# Patient Record
Sex: Female | Born: 1985 | Race: White | Hispanic: No | Marital: Single | Smoking: Current every day smoker
Health system: Southern US, Community
[De-identification: ages and names within clinical notes are randomized; demographics above are authoritative.]

## PROBLEM LIST (undated history)

## (undated) DIAGNOSIS — A419 Sepsis, unspecified organism: Secondary | ICD-10-CM

## (undated) DIAGNOSIS — F4310 Post-traumatic stress disorder, unspecified: Secondary | ICD-10-CM

## (undated) DIAGNOSIS — F32A Depression, unspecified: Secondary | ICD-10-CM

## (undated) DIAGNOSIS — F1410 Cocaine abuse, uncomplicated: Secondary | ICD-10-CM

## (undated) DIAGNOSIS — N390 Urinary tract infection, site not specified: Secondary | ICD-10-CM

## (undated) DIAGNOSIS — F1110 Opioid abuse, uncomplicated: Secondary | ICD-10-CM

## (undated) DIAGNOSIS — F329 Major depressive disorder, single episode, unspecified: Secondary | ICD-10-CM

## (undated) DIAGNOSIS — Z3A24 24 weeks gestation of pregnancy: Secondary | ICD-10-CM

## (undated) DIAGNOSIS — F319 Bipolar disorder, unspecified: Secondary | ICD-10-CM

## (undated) DIAGNOSIS — F1910 Other psychoactive substance abuse, uncomplicated: Secondary | ICD-10-CM

## (undated) DIAGNOSIS — F419 Anxiety disorder, unspecified: Secondary | ICD-10-CM

## (undated) DIAGNOSIS — R45851 Suicidal ideations: Secondary | ICD-10-CM

## (undated) HISTORY — PX: APPENDECTOMY: SHX54

---
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
## 2006-09-19 ENCOUNTER — Emergency Department (HOSPITAL_COMMUNITY): Admission: EM | Admit: 2006-09-19 | Discharge: 2006-09-19 | Payer: Self-pay | Admitting: Emergency Medicine

## 2006-10-02 ENCOUNTER — Emergency Department (HOSPITAL_COMMUNITY): Admission: EM | Admit: 2006-10-02 | Discharge: 2006-10-02 | Payer: Self-pay | Admitting: Emergency Medicine

## 2006-11-12 ENCOUNTER — Emergency Department (HOSPITAL_COMMUNITY): Admission: EM | Admit: 2006-11-12 | Discharge: 2006-11-12 | Payer: Self-pay | Admitting: Emergency Medicine

## 2007-05-16 ENCOUNTER — Emergency Department (HOSPITAL_COMMUNITY): Admission: EM | Admit: 2007-05-16 | Discharge: 2007-05-16 | Payer: Self-pay | Admitting: Emergency Medicine

## 2008-02-22 ENCOUNTER — Emergency Department (HOSPITAL_COMMUNITY): Admission: EM | Admit: 2008-02-22 | Discharge: 2008-02-22 | Payer: Self-pay | Admitting: Emergency Medicine

## 2008-09-18 ENCOUNTER — Emergency Department (HOSPITAL_BASED_OUTPATIENT_CLINIC_OR_DEPARTMENT_OTHER): Admission: EM | Admit: 2008-09-18 | Discharge: 2008-09-18 | Payer: Self-pay | Admitting: Emergency Medicine

## 2008-12-31 ENCOUNTER — Emergency Department (HOSPITAL_COMMUNITY): Admission: EM | Admit: 2008-12-31 | Discharge: 2008-12-31 | Payer: Self-pay | Admitting: Emergency Medicine

## 2009-04-08 ENCOUNTER — Ambulatory Visit: Payer: Self-pay | Admitting: Internal Medicine

## 2009-04-08 ENCOUNTER — Ambulatory Visit: Payer: Self-pay | Admitting: Cardiology

## 2009-04-08 ENCOUNTER — Inpatient Hospital Stay (HOSPITAL_COMMUNITY): Admission: EM | Admit: 2009-04-08 | Discharge: 2009-04-13 | Payer: Self-pay | Admitting: Emergency Medicine

## 2009-04-10 ENCOUNTER — Encounter: Payer: Self-pay | Admitting: Internal Medicine

## 2009-09-07 ENCOUNTER — Emergency Department (HOSPITAL_COMMUNITY): Admission: EM | Admit: 2009-09-07 | Discharge: 2009-09-07 | Payer: Self-pay | Admitting: Emergency Medicine

## 2009-10-09 IMAGING — CR DG CHEST 1V PORT
1 series · 1 of 1 positions shown · non-contrast
Comparison: 04/08/2009.

CLINICAL DATA: Sepsis.

PORTABLE CHEST - 1 VIEW

[view not recorded]
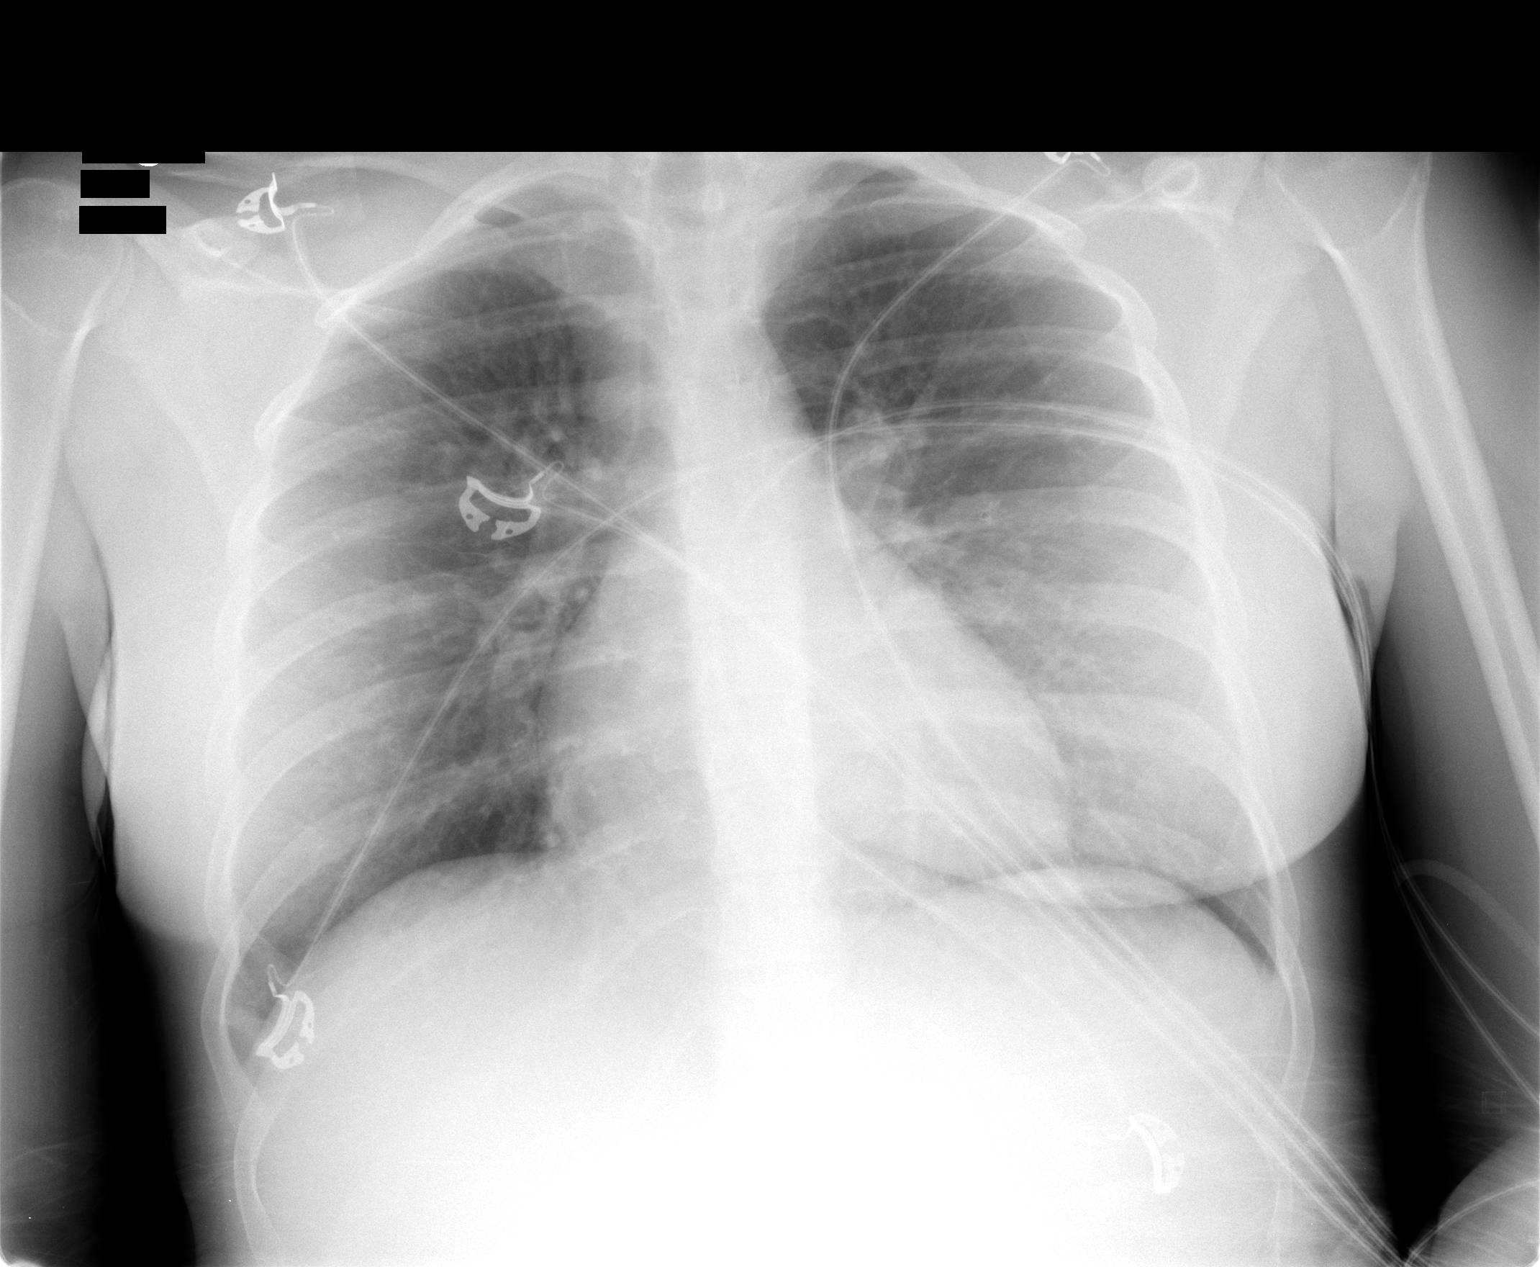

[1 of 1 positions shown; findings below may reference images not displayed]

FINDINGS: No infiltrate, gross pneumothorax or congestive heart
failure.  Heart appears more prominent than on prior exam which may
be related portable technique and poor inspiration.
IMPRESSION: No infiltrate.

## 2009-10-09 IMAGING — CR DG CHEST 1V PORT
1 series · 1 of 1 positions shown · non-contrast
Comparison: 04/09/2009

CLINICAL DATA: Sepsis/line placement

PORTABLE CHEST - 1 VIEW

[view not recorded]
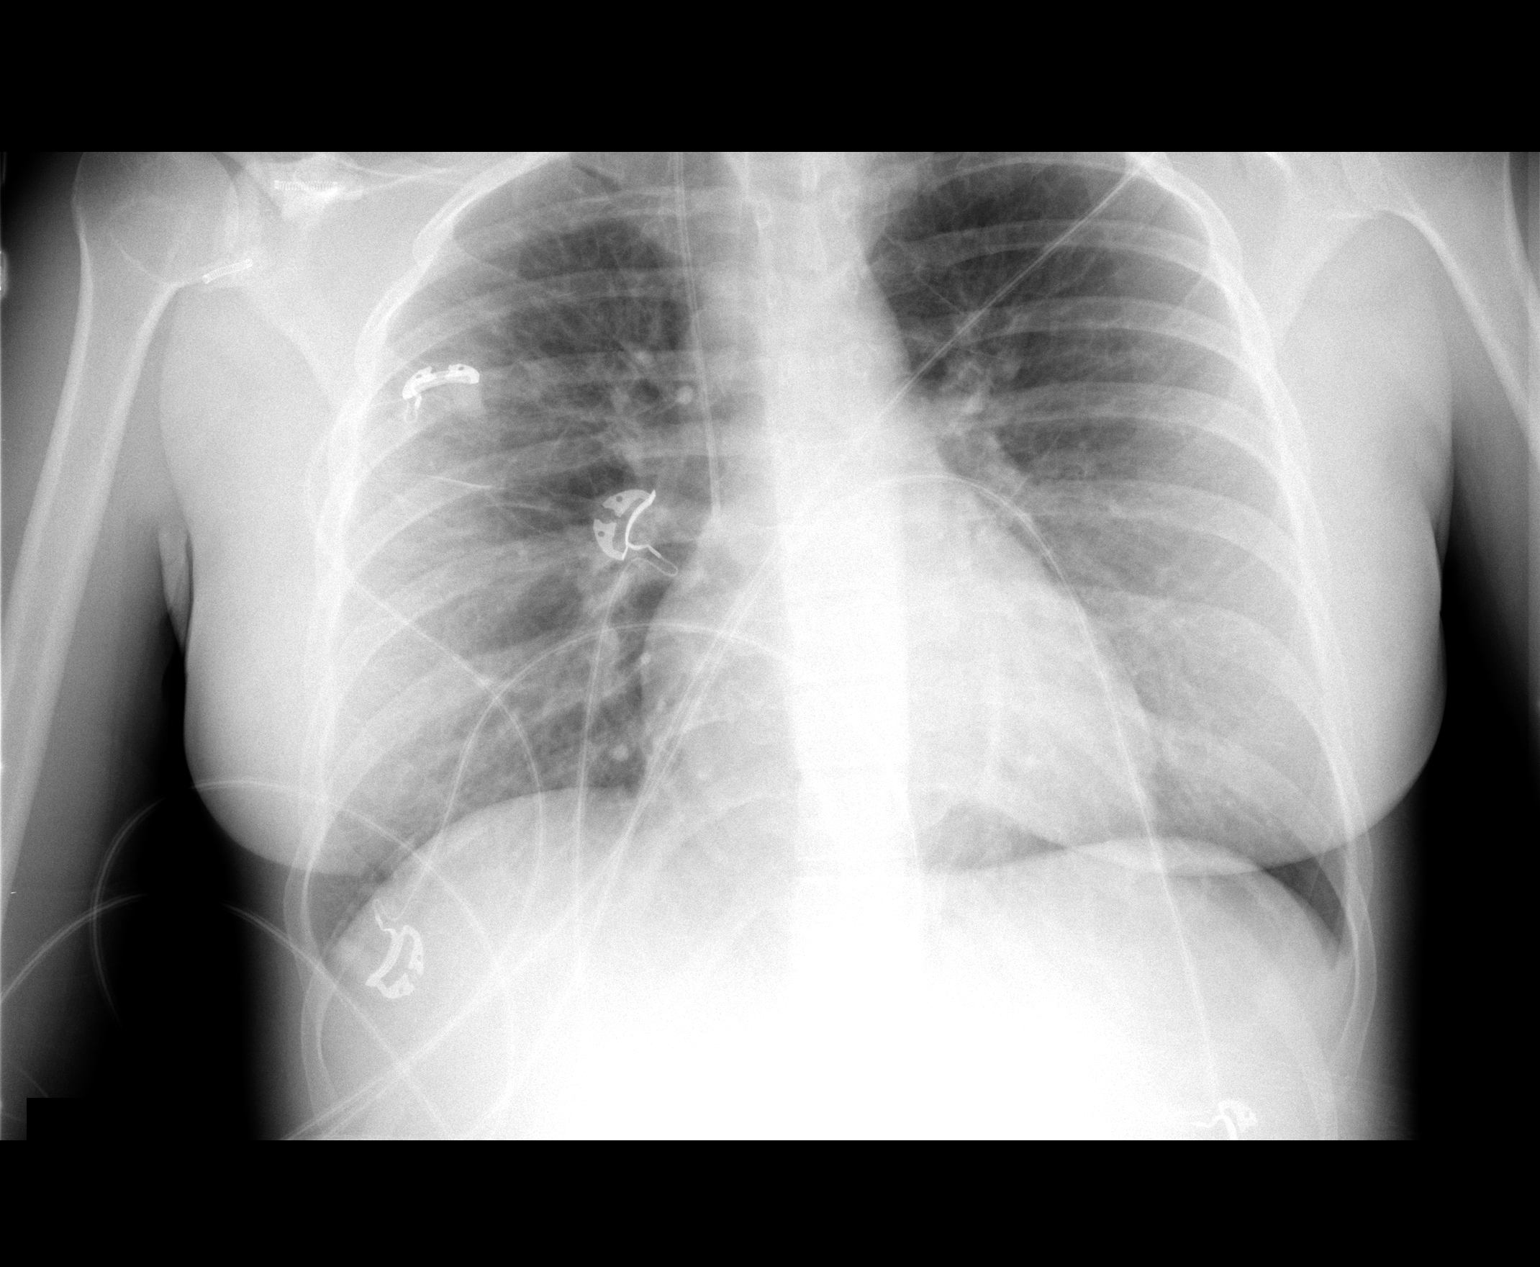

[1 of 1 positions shown; findings below may reference images not displayed]

FINDINGS: A right jugular central line is in place with its tip in
the mid SVC.  No pneumothorax.  Heart and lungs remain normal.
IMPRESSION: Right IJ central line placement to the SVC - no pneumothorax or
active disease.

## 2009-10-10 IMAGING — CR DG CHEST 1V PORT
1 series · 1 of 1 positions shown · non-contrast
Comparison: 04/09/2009.

CLINICAL DATA: Increased shortness of breath.

PORTABLE CHEST - 1 VIEW

[view not recorded]
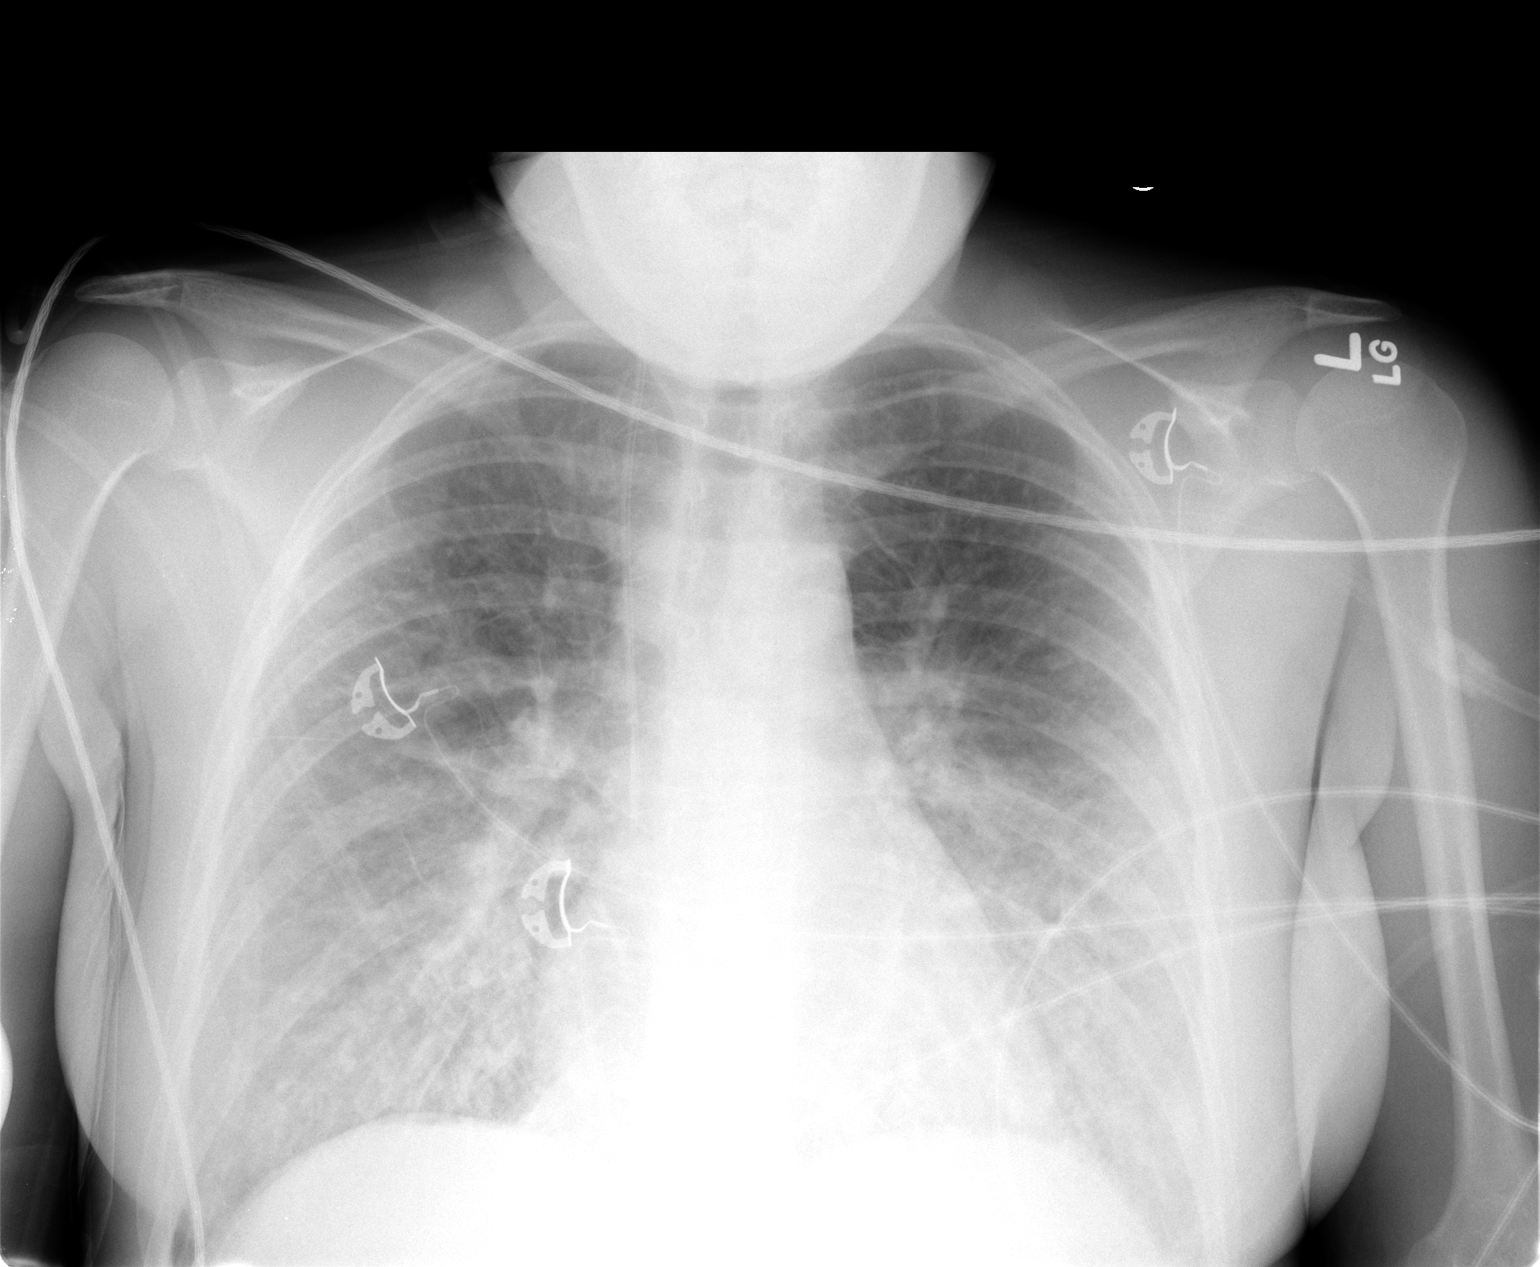

[1 of 1 positions shown; findings below may reference images not displayed]

FINDINGS: There is interval progression of a diffuse interstitial
pattern, suggesting edema.  A right IJ line is stable in position.
No focal airspace disease is present.
IMPRESSION: Interval development of moderate interstitial edema.

## 2009-10-11 ENCOUNTER — Emergency Department (HOSPITAL_COMMUNITY): Admission: EM | Admit: 2009-10-11 | Discharge: 2009-10-11 | Payer: Self-pay | Admitting: Emergency Medicine

## 2009-10-14 ENCOUNTER — Emergency Department (HOSPITAL_COMMUNITY): Admission: EM | Admit: 2009-10-14 | Discharge: 2009-10-15 | Payer: Self-pay | Admitting: Emergency Medicine

## 2009-12-10 ENCOUNTER — Emergency Department (HOSPITAL_COMMUNITY): Admission: EM | Admit: 2009-12-10 | Discharge: 2009-12-11 | Payer: Self-pay | Admitting: Emergency Medicine

## 2010-07-23 ENCOUNTER — Emergency Department (HOSPITAL_COMMUNITY): Admission: EM | Admit: 2010-07-23 | Discharge: 2010-07-23 | Payer: Self-pay | Admitting: Emergency Medicine

## 2011-01-04 ENCOUNTER — Emergency Department (HOSPITAL_COMMUNITY): Admission: EM | Admit: 2011-01-04 | Discharge: 2011-01-04 | Source: Home / Self Care | Admitting: Emergency Medicine

## 2011-01-04 LAB — COMPREHENSIVE METABOLIC PANEL
AST: 29 U/L (ref 0–37)
BUN: 5 mg/dL — ABNORMAL LOW (ref 6–23)
CO2: 27 mEq/L (ref 19–32)
Calcium: 10.1 mg/dL (ref 8.4–10.5)
Chloride: 104 mEq/L (ref 96–112)
Creatinine, Ser: 0.73 mg/dL (ref 0.4–1.2)
Glucose, Bld: 99 mg/dL (ref 70–99)
Total Protein: 8.6 g/dL — ABNORMAL HIGH (ref 6.0–8.3)

## 2011-01-04 LAB — URINALYSIS, ROUTINE W REFLEX MICROSCOPIC
Ketones, ur: NEGATIVE mg/dL
Leukocytes, UA: NEGATIVE
Nitrite: NEGATIVE
Protein, ur: NEGATIVE mg/dL
Urobilinogen, UA: 0.2 mg/dL (ref 0.0–1.0)
pH: 6.5 (ref 5.0–8.0)

## 2011-01-04 LAB — DIFFERENTIAL
Basophils Absolute: 0 10*3/uL (ref 0.0–0.1)
Eosinophils Absolute: 0 10*3/uL (ref 0.0–0.7)
Eosinophils Relative: 0 % (ref 0–5)
Lymphs Abs: 1.4 10*3/uL (ref 0.7–4.0)
Monocytes Relative: 5 % (ref 3–12)
Neutrophils Relative %: 84 % — ABNORMAL HIGH (ref 43–77)

## 2011-01-04 LAB — URINE MICROSCOPIC-ADD ON

## 2011-01-04 LAB — CBC
MCV: 89.5 fL (ref 78.0–100.0)
RBC: 4.97 MIL/uL (ref 3.87–5.11)
RDW: 11.8 % (ref 11.5–15.5)
WBC: 12.1 10*3/uL — ABNORMAL HIGH (ref 4.0–10.5)

## 2011-01-04 LAB — LIPASE, BLOOD: Lipase: 17 U/L (ref 11–59)

## 2011-02-21 LAB — URINALYSIS, ROUTINE W REFLEX MICROSCOPIC
Bilirubin Urine: NEGATIVE
Leukocytes, UA: NEGATIVE
Nitrite: NEGATIVE
Protein, ur: NEGATIVE mg/dL
Specific Gravity, Urine: 1.023 (ref 1.005–1.030)
Urobilinogen, UA: 0.2 mg/dL (ref 0.0–1.0)

## 2011-02-21 LAB — DIFFERENTIAL
Basophils Absolute: 0 10*3/uL (ref 0.0–0.1)
Basophils Relative: 0 % (ref 0–1)
Eosinophils Absolute: 0 10*3/uL (ref 0.0–0.7)
Lymphocytes Relative: 10 % — ABNORMAL LOW (ref 12–46)
Monocytes Absolute: 0.5 10*3/uL (ref 0.1–1.0)
Neutro Abs: 10.8 10*3/uL — ABNORMAL HIGH (ref 1.7–7.7)
Neutrophils Relative %: 86 % — ABNORMAL HIGH (ref 43–77)

## 2011-02-21 LAB — URINE MICROSCOPIC-ADD ON

## 2011-02-21 LAB — BASIC METABOLIC PANEL
BUN: 10 mg/dL (ref 6–23)
CO2: 27 mEq/L (ref 19–32)
GFR calc Af Amer: >60 mL/min (ref >60)
GFR calc non Af Amer: >60 mL/min (ref >60)
Potassium: 4.2 mEq/L (ref 3.5–5.1)

## 2011-02-21 LAB — CBC
Hemoglobin: 13.1 g/dL (ref 12.0–15.0)
RDW: 12.3 % (ref 11.5–15.5)

## 2011-02-21 LAB — PREGNANCY, URINE: Preg Test, Ur: NEGATIVE

## 2011-02-23 LAB — URINALYSIS, ROUTINE W REFLEX MICROSCOPIC
Glucose, UA: NEGATIVE mg/dL
Nitrite: NEGATIVE
pH: 6 (ref 5.0–8.0)

## 2011-02-23 LAB — RPR: RPR Ser Ql: NONREACTIVE

## 2011-02-23 LAB — WET PREP, GENITAL: Trich, Wet Prep: NONE SEEN

## 2011-02-23 LAB — URINE CULTURE

## 2011-02-23 LAB — URINE MICROSCOPIC-ADD ON

## 2011-02-23 LAB — GC/CHLAMYDIA PROBE AMP, GENITAL
Chlamydia, DNA Probe: NEGATIVE
GC Probe Amp, Genital: NEGATIVE

## 2011-03-12 LAB — DIFFERENTIAL
Eosinophils Absolute: 0.1 10*3/uL (ref 0.0–0.7)
Eosinophils Relative: 1 % (ref 0–5)
Lymphocytes Relative: 15 % (ref 12–46)
Lymphs Abs: 1.4 10*3/uL (ref 0.7–4.0)
Monocytes Absolute: 0.4 10*3/uL (ref 0.1–1.0)
Monocytes Relative: 4 % (ref 3–12)

## 2011-03-12 LAB — PREGNANCY, URINE: Preg Test, Ur: NEGATIVE

## 2011-03-12 LAB — URINALYSIS, ROUTINE W REFLEX MICROSCOPIC
Bilirubin Urine: NEGATIVE
Hgb urine dipstick: NEGATIVE
Nitrite: NEGATIVE
Specific Gravity, Urine: 1.005 (ref 1.005–1.030)
Urobilinogen, UA: 0.2 mg/dL (ref 0.0–1.0)
pH: 7.5 (ref 5.0–8.0)

## 2011-03-12 LAB — CBC
MCHC: 34 g/dL (ref 30.0–36.0)
MCV: 92.8 fL (ref 78.0–100.0)
Platelets: 338 10*3/uL (ref 150–400)
RBC: 4.63 MIL/uL (ref 3.87–5.11)

## 2011-03-12 LAB — COMPREHENSIVE METABOLIC PANEL
ALT: 14 U/L (ref 0–35)
AST: 27 U/L (ref 0–37)
Albumin: 4.2 g/dL (ref 3.5–5.2)
CO2: 24 mEq/L (ref 19–32)
Calcium: 9.4 mg/dL (ref 8.4–10.5)
Creatinine, Ser: 0.64 mg/dL (ref 0.4–1.2)
GFR calc Af Amer: >60 mL/min (ref >60)
Sodium: 135 mEq/L (ref 135–145)
Total Protein: 7.9 g/dL (ref 6.0–8.3)

## 2011-03-12 LAB — URINE MICROSCOPIC-ADD ON

## 2011-03-12 LAB — RAPID URINE DRUG SCREEN, HOSP PERFORMED
Cocaine: NOT DETECTED
Opiates: POSITIVE — AB

## 2011-03-12 LAB — URINE CULTURE

## 2011-03-13 LAB — DIFFERENTIAL
Basophils Absolute: 0 10*3/uL (ref 0.0–0.1)
Lymphocytes Relative: 9 % — ABNORMAL LOW (ref 12–46)
Lymphs Abs: 0.8 10*3/uL (ref 0.7–4.0)
Monocytes Absolute: 0.1 10*3/uL (ref 0.1–1.0)
Neutro Abs: 7.6 10*3/uL (ref 1.7–7.7)

## 2011-03-13 LAB — CBC
Hemoglobin: 13.1 g/dL (ref 12.0–15.0)
Platelets: 233 10*3/uL (ref 150–400)
RDW: 13.7 % (ref 11.5–15.5)
WBC: 8.6 10*3/uL (ref 4.0–10.5)

## 2011-03-13 LAB — BASIC METABOLIC PANEL
Calcium: 9.5 mg/dL (ref 8.4–10.5)
GFR calc non Af Amer: >60 mL/min (ref >60)
Glucose, Bld: 153 mg/dL — ABNORMAL HIGH (ref 70–99)
Potassium: 3.6 mEq/L (ref 3.5–5.1)
Sodium: 140 mEq/L (ref 135–145)

## 2011-03-13 LAB — ETHANOL: Alcohol, Ethyl (B): <5 mg/dL (ref 0–10)

## 2011-03-13 LAB — RAPID URINE DRUG SCREEN, HOSP PERFORMED
Amphetamines: NOT DETECTED
Barbiturates: NOT DETECTED
Benzodiazepines: POSITIVE — AB
Cocaine: NOT DETECTED

## 2011-03-18 LAB — COMPREHENSIVE METABOLIC PANEL
ALT: 40 U/L — ABNORMAL HIGH (ref 0–35)
ALT: 42 U/L — ABNORMAL HIGH (ref 0–35)
ALT: 52 U/L — ABNORMAL HIGH (ref 0–35)
AST: 125 U/L — ABNORMAL HIGH (ref 0–37)
AST: 32 U/L (ref 0–37)
AST: 48 U/L — ABNORMAL HIGH (ref 0–37)
Alkaline Phosphatase: 47 U/L (ref 39–117)
Alkaline Phosphatase: 66 U/L (ref 39–117)
BUN: 5 mg/dL — ABNORMAL LOW (ref 6–23)
CO2: 20 mEq/L (ref 19–32)
CO2: 23 mEq/L (ref 19–32)
CO2: 24 mEq/L (ref 19–32)
Calcium: 8 mg/dL — ABNORMAL LOW (ref 8.4–10.5)
Calcium: 9 mg/dL (ref 8.4–10.5)
Chloride: 112 mEq/L (ref 96–112)
Chloride: 113 mEq/L — ABNORMAL HIGH (ref 96–112)
Creatinine, Ser: 0.88 mg/dL (ref 0.4–1.2)
GFR calc Af Amer: >60 mL/min (ref >60)
GFR calc Af Amer: >60 mL/min (ref >60)
GFR calc Af Amer: >60 mL/min (ref >60)
GFR calc non Af Amer: >60 mL/min (ref >60)
GFR calc non Af Amer: >60 mL/min (ref >60)
GFR calc non Af Amer: >60 mL/min (ref >60)
Glucose, Bld: 126 mg/dL — ABNORMAL HIGH (ref 70–99)
Glucose, Bld: 89 mg/dL (ref 70–99)
Potassium: 3.1 mEq/L — ABNORMAL LOW (ref 3.5–5.1)
Potassium: 3.4 mEq/L — ABNORMAL LOW (ref 3.5–5.1)
Sodium: 140 mEq/L (ref 135–145)
Sodium: 140 mEq/L (ref 135–145)
Sodium: 141 mEq/L (ref 135–145)
Total Bilirubin: 0.5 mg/dL (ref 0.3–1.2)
Total Bilirubin: 0.7 mg/dL (ref 0.3–1.2)
Total Protein: 5.2 g/dL — ABNORMAL LOW (ref 6.0–8.3)
Total Protein: 6.2 g/dL (ref 6.0–8.3)

## 2011-03-18 LAB — CULTURE, BLOOD (ROUTINE X 2): Culture: NO GROWTH

## 2011-03-18 LAB — CBC
HCT: 29 % — ABNORMAL LOW (ref 36.0–46.0)
HCT: 30.5 % — ABNORMAL LOW (ref 36.0–46.0)
HCT: 31.8 % — ABNORMAL LOW (ref 36.0–46.0)
Hemoglobin: 10.4 g/dL — ABNORMAL LOW (ref 12.0–15.0)
MCHC: 33.4 g/dL (ref 30.0–36.0)
MCHC: 33.7 g/dL (ref 30.0–36.0)
MCHC: 34.6 g/dL (ref 30.0–36.0)
MCHC: 35 g/dL (ref 30.0–36.0)
MCV: 89.9 fL (ref 78.0–100.0)
MCV: 91.1 fL (ref 78.0–100.0)
MCV: 92.3 fL (ref 78.0–100.0)
Platelets: 148 10*3/uL — ABNORMAL LOW (ref 150–400)
Platelets: 182 10*3/uL (ref 150–400)
Platelets: 243 10*3/uL (ref 150–400)
RBC: 3.14 MIL/uL — ABNORMAL LOW (ref 3.87–5.11)
RBC: 3.32 MIL/uL — ABNORMAL LOW (ref 3.87–5.11)
RBC: 3.34 MIL/uL — ABNORMAL LOW (ref 3.87–5.11)
RDW: 12.8 % (ref 11.5–15.5)
RDW: 13.3 % (ref 11.5–15.5)
WBC: 14.1 10*3/uL — ABNORMAL HIGH (ref 4.0–10.5)
WBC: 18.3 10*3/uL — ABNORMAL HIGH (ref 4.0–10.5)

## 2011-03-18 LAB — DIFFERENTIAL
Basophils Absolute: 0 10*3/uL (ref 0.0–0.1)
Basophils Relative: 0 % (ref 0–1)
Basophils Relative: 0 % (ref 0–1)
Basophils Relative: 0 % (ref 0–1)
Eosinophils Absolute: 0 10*3/uL (ref 0.0–0.7)
Eosinophils Absolute: 0.1 10*3/uL (ref 0.0–0.7)
Eosinophils Absolute: 0.2 10*3/uL (ref 0.0–0.7)
Eosinophils Relative: 1 % (ref 0–5)
Eosinophils Relative: 2 % (ref 0–5)
Lymphs Abs: 0.1 10*3/uL — ABNORMAL LOW (ref 0.7–4.0)
Lymphs Abs: 0.9 10*3/uL (ref 0.7–4.0)
Monocytes Relative: 0 % — ABNORMAL LOW (ref 3–12)
Monocytes Relative: 2 % — ABNORMAL LOW (ref 3–12)
Monocytes Relative: 6 % (ref 3–12)
Neutro Abs: 15 10*3/uL — ABNORMAL HIGH (ref 1.7–7.7)
Neutrophils Relative %: 68 % (ref 43–77)
Neutrophils Relative %: 86 % — ABNORMAL HIGH (ref 43–77)
Neutrophils Relative %: 92 % — ABNORMAL HIGH (ref 43–77)
Neutrophils Relative %: 97 % — ABNORMAL HIGH (ref 43–77)

## 2011-03-18 LAB — BASIC METABOLIC PANEL
BUN: 5 mg/dL — ABNORMAL LOW (ref 6–23)
BUN: 6 mg/dL (ref 6–23)
BUN: 7 mg/dL (ref 6–23)
CO2: 23 mEq/L (ref 19–32)
CO2: 24 mEq/L (ref 19–32)
Calcium: 7 mg/dL — ABNORMAL LOW (ref 8.4–10.5)
Calcium: 7.7 mg/dL — ABNORMAL LOW (ref 8.4–10.5)
Chloride: 107 mEq/L (ref 96–112)
Creatinine, Ser: 0.73 mg/dL (ref 0.4–1.2)
Creatinine, Ser: 0.81 mg/dL (ref 0.4–1.2)
GFR calc Af Amer: >60 mL/min (ref >60)
GFR calc non Af Amer: >60 mL/min (ref >60)
Glucose, Bld: 95 mg/dL (ref 70–99)
Glucose, Bld: 96 mg/dL (ref 70–99)
Potassium: 3.4 mEq/L — ABNORMAL LOW (ref 3.5–5.1)

## 2011-03-18 LAB — LACTIC ACID, PLASMA
Lactic Acid, Venous: 1 mmol/L (ref 0.5–2.2)
Lactic Acid, Venous: 1.1 mmol/L (ref 0.5–2.2)
Lactic Acid, Venous: 2.2 mmol/L (ref 0.5–2.2)

## 2011-03-18 LAB — BLOOD GAS, ARTERIAL
Bicarbonate: 20.5 mEq/L (ref 20.0–24.0)
Drawn by: 232811
FIO2: 1 %
TCO2: 19.2 mmol/L (ref 0–100)
pCO2 arterial: 33 mmHg — ABNORMAL LOW (ref 35.0–45.0)
pCO2 arterial: 33.9 mmHg — ABNORMAL LOW (ref 35.0–45.0)
pH, Arterial: 7.391 (ref 7.350–7.400)
pH, Arterial: 7.398 (ref 7.350–7.400)

## 2011-03-18 LAB — CARBOXYHEMOGLOBIN
Carboxyhemoglobin: 0.9 % (ref 0.5–1.5)
Total hemoglobin: 9.6 g/dL — ABNORMAL LOW (ref 12.5–16.0)

## 2011-03-18 LAB — RAPID URINE DRUG SCREEN, HOSP PERFORMED
Amphetamines: NOT DETECTED
Benzodiazepines: POSITIVE — AB
Cocaine: POSITIVE — AB
Opiates: NOT DETECTED
Tetrahydrocannabinol: NOT DETECTED

## 2011-03-18 LAB — HIV ANTIBODY (ROUTINE TESTING W REFLEX): HIV: NONREACTIVE

## 2011-03-18 LAB — TSH: TSH: 0.232 u[IU]/mL — ABNORMAL LOW (ref 0.350–4.500)

## 2011-03-18 LAB — MAGNESIUM: Magnesium: 2.1 mg/dL (ref 1.5–2.5)

## 2011-03-18 LAB — URINALYSIS, ROUTINE W REFLEX MICROSCOPIC
Bilirubin Urine: NEGATIVE
Hgb urine dipstick: NEGATIVE
Specific Gravity, Urine: 1.008 (ref 1.005–1.030)
pH: 7.5 (ref 5.0–8.0)

## 2011-03-18 LAB — POCT CARDIAC MARKERS: Troponin i, poc: <0.05 ng/mL (ref 0.00–0.09)

## 2011-03-18 LAB — ACTH STIMULATION, 3 TIME POINTS
Cortisol, 60 Min: 26.2 ug/dL (ref >20)
Cortisol, Base: 13.9 ug/dL

## 2011-03-24 LAB — URINALYSIS, ROUTINE W REFLEX MICROSCOPIC
Bilirubin Urine: NEGATIVE
Glucose, UA: NEGATIVE mg/dL
Ketones, ur: NEGATIVE mg/dL
Protein, ur: NEGATIVE mg/dL
pH: 7 (ref 5.0–8.0)

## 2011-03-24 LAB — PREGNANCY, URINE: Preg Test, Ur: NEGATIVE

## 2011-03-24 LAB — URINE MICROSCOPIC-ADD ON

## 2011-03-24 LAB — WET PREP, GENITAL
Trich, Wet Prep: NONE SEEN
Yeast Wet Prep HPF POC: NONE SEEN

## 2011-03-24 LAB — GC/CHLAMYDIA PROBE AMP, GENITAL: GC Probe Amp, Genital: NEGATIVE

## 2011-04-22 NOTE — Consult Note (Signed)
NAMERACHNA, SCHONBERGER NO.:  [id number]   MEDICAL RECORD NO.:  [id number]          PATIENT TYPE:  INP   LOCATION:  1517                         FACILITY:  [facility]   PHYSICIAN:  Antonietta Breach, M.D.  DATE OF BIRTH:  [birth year]   DATE OF CONSULTATION:  04/11/2009  DATE OF DISCHARGE:  04/13/2009                                 CONSULTATION   REQUESTING PHYSICIAN:  [facility].   REASON FOR CONSULTATION:  Polysubstance abuse.   HISTORY OF PRESENT ILLNESS:  Diane Foley is a 25 year old female  admitted to the [facility] on Apr 08, 2009 due to sepsis,  fever and heroin withdrawal.  At this time, she has a fever of unknown  origin due.  She has been trying to wean herself off of opioids.  She  has tried to take Suboxone in the past.  However at this time, she is  trying to use it as a maintenance  medicine mixed with other pure opioids.  She is taking 1/4 of an 8 mg  tablet per day.  She has been using heroin daily.  She states she uses  10 bags of heroin per day on a maximum usage day.  She has been using  approximately 4 bags of heroin per day for 3 weeks.   She denies any depressed mood.  She has constructive interests and  future goals.  She does not have any thought of harming herself or  others.  Her orientation and memory function are intact.  She is not  agitated or combative.  She is cooperative with care.  She has currently been placed on clonidine 0.1 mg per day and 5 mg of  Ambien q.h.s.   PAST PSYCHIATRIC HISTORY:  She has been placed on Suboxone before.   FAMILY PSYCHIATRIC HISTORY:  None known.   SOCIAL HISTORY:  She is single.   PAST MEDICAL HISTORY:  As above.   LABORATORY DATA:  SGOT, SGPT, alcohol level, HCG all unremarkable.  Urine drug screen was positive for benzodiazepines as well as cocaine.   ALLERGIES:  NO KNOWN DRUG ALLERGIES.   MEDICATIONS:  As above.  MAR is reviewed.   MENTAL STATUS EXAM:  Diane Foley is  alert.  She is oriented to all  spheres.  Her eye contact is good.  Her attention span is normal.  Concentration normal.  Affect broad and appropriate.  Mood within normal  limits.  Intelligence normal.  Fund of knowledge normal.  Speech  involves normal rate and prosody without dysarthria.  Thought process is  logical, coherent, goal directed.  Thought content, no thoughts of  harming herself or others.  No delusions or hallucinations.  Insight is  intact.  Judgment is intact.  She does realize the severity of her  substance dependence and wants rehabilitation.   ASSESSMENT:  AXIS I:  Opioid dependence.  Rule out polysubstance  dependence.  AXIS II:  Deferred.  AXIS III:  See past medical history.  AXIS IV:  Primary support group general/medical.  AXIS V:  55.   Diane Foley is  not at risk to harm herself or others.  She agrees to call  emergency services immediately for any thoughts of harming herself,  thoughts of harming others or distress.   The undersigned provided ego supportive psychotherapy and education.  The indications, alternatives and adverse effects of the clonidine  protocol were discussed with the patient.  She understands and wants to  proceed with the clonidine protocol.   RECOMMENDATIONS:  1. Would start the clonidine protocol.  2. Would ask the social worker to set Ms. Jons with the [employer] upon discharge.  3. A 12-step method and groups.      Antonietta Breach, M.D.  Electronically Signed     JW/MEDQ  D:  04/21/2009  T:  04/21/2009  Job:  [id number]

## 2011-04-22 NOTE — Discharge Summary (Signed)
Diane Foley, Diane Foley                ACCOUNT NO.:  [id number]   MEDICAL RECORD NO.:  [id number]          PATIENT TYPE:  INP   LOCATION:  1517                         FACILITY:  [facility]   PHYSICIAN:  Marcellus Scott, MD     DATE OF BIRTH:  [birth year]   DATE OF ADMISSION:  04/08/2009  DATE OF DISCHARGE:  04/13/2009                               DISCHARGE SUMMARY   PRIMARY CARE PHYSICIAN:  Gentry Fitz but will attempt to be seen at  [facility].  The patient will sign out against medical  advice.   DISCHARGE DIAGNOSES:  1. Sepsis/septic shock, resolved.  Etiology unclear.  2. Acute pulmonary edema secondary to IV fluids, resolved.  3. Hypokalemia, repleted.  4. Anemia, stable.  5. Drug withdrawal syndrome.  The patient prematurely discontinued      inpatient detoxification.  6. Leukocytosis, improved.  7. Hypomagnesemia, repleted.  8. Abnormal thyroid function tests.  9. Polysubstance abuse including cocaine, heroin.  10.Tobacco abuse.   DISCHARGE MEDICATIONS:  Avelox 400 mg p.o. daily to complete a total 1  week course of antibiotics.   PROCEDURES:  1. Chest x-ray on May 5.  Impression, progressive left lower lobe air      space disease and effusion worrisome for pneumonia.  Stable to      slight increase in right lower lobe air space disease also      worrisome for pneumonia.  Decrease in now mild interstitial edema.  2. Ultrasound of the abdomen.  Impression, thick walled gallbladder.      This represented a nonspecific finding.  It may be seen in the      presence of ascites which is noted in this patient.  There is      normal ascites.  Bilateral pleural effusions.  No gallstones.  No      biliary ductal dilatation.  3. Chest x-ray, multiple chest x-rays from admission.  4. 2-D echocardiogram on May 4.  Ejection fraction 50-55%.   PERTINENT LABS:  Magnesium 2.1, basic metabolic panel with potassium of  3.4, BUN 5, creatinine 0.81.  CBC with hemoglobin 11.1,  hematocrit 32,  white blood cell 11, platelets 182.  ACTH stim test was normal.  Hepatic  panel was unremarkable except for ALT 40, total protein 5.3, albumin  2.8, glucose 146, free T4 0.61, total T3 54, total T4 3.7, random  cortisol 4.3, D-dimer 13.4, serum amylase 34, BNP 133, HIV antibody was  nonreactive.  TSH was 0.232.  Blood cultures from May 2 were no growth  to date.  Point of care cardiac markers negative.  Blood alcohol level  less than 5.  Urine drug screen positive for benzodiazepines and  cocaine.  Urine pregnancy test was negative.  Urinalysis with no  features suggestive of urinary tract infection.   CONSULTATIONS:  Pulmonary critical care, Dr. Marchelle Gearing.   HOSPITAL COURSE AND PATIENT DISPOSITION:  Please refer to the history  and physical note for initial admission details.  In summary, [name]  is a 25 year old Caucasian female patient with history of polysubstance  abuse who indicates that she used some  contaminated or previously used  spoons and needles to inject heroin.  She presented with sudden onset of  fevers, chills, myalgias, nausea, vomiting, diarrhea shortly thereafter.  She also complained of chest pain or discomfort, dyspnea and coughing  which was nonproductive.  She had a temperature of 100.9 in the  emergency room with soft blood pressures and tachycardic.  She was  evaluated to have early sepsis and was admitted to ICU stepdown area for  evaluation and management.  1. Sepsis/septic shock.  The patient was admitted to the ICU.  Her      cultures were sent off.  She was started on IV fluid resuscitation      and placed empirically on IV Zosyn and vancomycin.  Cultures are      thus far negative.  The suspicion is that she might have had      endotoxic shock secondary to her contaminated substance abuse      versus urinary tract infection although her urinalysis was      negative.  The other potential possibility is pneumonia given her      history  of cough that was nonproductive.  The patient's care was      actually taken over by the critical care team.  She was also placed      on hydrocortisone and random cortisol levels were checked.  These      random cortisol levels were low given her stress situation.  To      rule out panhypopituitarism given her abnormal thyroid function      tests and her low random cortisol in stress situation the patient      has had a stim test which was negative.  With these measures the      patient steadily improved.  Her blood pressures stabilized off IV      fluids.  The patient was not feeling sick any more and without      leukocytosis.  She has been eager to go home for the last couple of      days.  The patient was transferred out of the ICU to a regular      medical bed.  She was switched to oral Avelox yesterday.  Last      evening she indicated she did not want to stay in the hospital any      longer.  However, after counseling her in the presence of her mom      and family the patient agreed to stay overnight.  Today, however,      she declines for further inpatient stay and wants to sign out      against medical advice.  2. Acute pulmonary edema.  This was thought to be secondary to IV      fluids.  Her IV fluids were discontinued.  The patient received      aliquots of Lasix with resolution of her cough, dyspnea and her      lung fields are clear to auscultation.  3. Hypokalemia and hypomagnesemia, repleted.  4. Anemia which is stable and will need to be worked up as an      outpatient as deemed necessary.  5. Drug withdrawal syndrome.  Psychiatry was consulted by the critical      care team.  Please refer to Dr. Providence Crosby note for details.  He      indicated opioid dependence and the patient agreed to an inpatient      clonidine withdrawal  protocol.  The same was started, today being      day #2.  Today, however, the patient refuses to continue with      completion of inpatient detox  protocol.  It has been clearly      explained to her multiple times that if she leaves against medical      advice she will not get detoxed and she runs the risk of withdrawal      symptoms which could be deleterious including death.  She also is      at risk for reabusing the drugs.  The patient clearly understands      this.  However, she chooses to sign out against medical advice.      The patient is competent to make medical decisions but I believe      she is making a bad judgment.  We will provide her with [facility]      referral through social work.  The patient, however, indicates that      she has been there and is aware of how to get in touch with them      and will not wait for the social worker for this information.  Also      have provided information to make appointment with [facility] for      medical followup.   I have discussed her case with Dr. Jeanie Sewer in detail and he agrees  that the patient ideally should stay inpatient until the clonidine  withdrawal protocol is completed.  However, he also indicates that there  is no criteria for comitting this patient and she is competent and she  can sign out against medical advice.  Obviously because we cannot  monitor her while on clonidine she will not be discharged on any.  We  will however provide her with a 3 day supply of Avelox to ensure that  she completes a week of antibiotics treating for her diagnosed sepsis on  admission.   All of this was explained in detail to the patient who verbalizes  understanding.  This discussion was had in the presence of the patient's  nurse.  She has also been advised to seek immediate medical attention if  there is any  deterioration in her medical condition.  The patient does not have any  delusions, hallucinations, suicidal or homicidal ideations.   Time taken in coordinating this discharge is 35 minutes.      Marcellus Scott, MD  Electronically Signed     AH/MEDQ  D:   04/13/2009  T:  04/13/2009  Job:  [id number]   cc:   Antonietta Breach, M.D.   [facility]  Fax: [phone]

## 2011-04-22 NOTE — H&P (Signed)
Diane Foley, Diane Foley                ACCOUNT NO.:  [id number]   MEDICAL RECORD NO.:  [id number]          PATIENT TYPE:  INP   LOCATION:  1224                         [facility]ACILITY:  [facility]   PHYSICIAN:  Della Goo, M.D. DATE OF BIRTH:  May 17, 1986   DATE OF ADMISSION:  04/08/2009  DATE OF DISCHARGE:                              HISTORY & PHYSICAL   PRIMARY CARE PHYSICIAN:  Unassigned.   CHIEF COMPLAINT:  Fever, chills, body aches.   HISTORY OF PRESENT ILLNESS:  This is a 25 year old female, who presents  to the emergency department secondary to complaints of sudden onset of  fever, chills, myalgias, along with nausea, vomiting, and diarrhea over  the day.  The patient also reports having chest pain/discomfort,  shortness of breath, and reports coughing, but no production of her  cough.  The patient was found to have temperature of 100.9 in the  emergency department.  She was also found to have mildly low blood  pressure and tachycardia.   The patient reports being off heroin for 1 week and obtaining a  medication for detox from friends and has been taking Suboxone.   PAST MEDICAL HISTORY:  History of polysubstance abuse, cocaine, and  heroin.   MEDICATIONS:  At this time, none.   ALLERGIES:  No known drug allergies.   SOCIAL HISTORY:  Patient is a smoker and she smokes 1 pack of cigarettes  daily.  She denies alcohol usage, and her illicit drug usage is cocaine,  heroin.   FAMILY HISTORY:  Unable to obtain.   REVIEW OF SYSTEMS:  Pertinent positives are mentioned above.  All other  organ systems are negative.Marland Kitchen   PHYSICAL EXAMINATION FINDINGS:  This is a 25 year old, thin female in  discomfort, but no acute distress.  VITAL SIGNS:  Temperature 100.9, blood pressure of 108/52, heart rate  128, respirations 20, and O2 saturation is 98% on room air.  HEENT:  Normocephalic, atraumatic.  There is no scleral icterus, pupils  equally round and reactive to light,  extraocular  movements are intact,  funduscopic benign.  Oropharynx is clear.  NECK:  Supple, full range of motion.  No thyromegaly, adenopathy, or  jugulovenous distention.  CARDIOVASCULAR:  A tachycardiac rate, rhythm.  No murmurs, gallops, rubs.  LUNGS:  Clear to auscultation bilaterally.  ABDOMEN:  Positive bowel sounds, soft, nontender, and nondistended.  EXTREMITIES:  Without cyanosis, clubbing, or edema.  NEUROLOGIC:  Alert and oriented x3.  There are no focal deficits.   LABORATORY STUDIES:  White blood cell count 1.7, hemoglobin 13.2,  hematocrit 38.2, platelets 243, neutrophils 92%, lymphocytes 6%.  Urine  drug screen positive for cocaine and positive for benzodiazepines.  Point-of-care cardiac markers with an MB of 46.5, CK-MB less than 1.0,  and troponin less than 0.05.  EKG reveals a sinus tachycardia without  acute ST-segment changes.  Urinalysis is negative.  Urine HCG is  negative.  Chest x-ray also no acute disease findings.   ASSESSMENT:  A 25 year old female being admitted with:  1. Early sepsis.  2. Tachycardia.  3. Polysubstance abuse.  4. Tobacco abuse.  PLAN:  Patient will be admitted to the stepdown ICU area.  IV fluids  have been ordered for fluid resuscitation.  Blood cultures have also  been ordered.  Patient has been placed on vancomycin and Zosyn therapy.  The concern is whether patient may have a possible bacteremia or  subacute bacterial endocarditis.  Patient will be placed on neutropenic  precautions as well, and her white blood cell count will be monitored.  An HIV test has been discussed with the patient and patient is agreeable  to HIV testing, which will be ordered.  Polysubstance abuse counseling  will also be requested as well.  DVT and GI prophylaxis have also been  ordered.      Della Goo, M.D.  Electronically Signed     HJ/MEDQ  D:  04/09/2009  T:  04/09/2009  Job:  [id number]

## 2011-04-25 NOTE — Op Note (Signed)
NAMEAISLYN, Diane Foley                ACCOUNT NO.:  [id number]   MEDICAL RECORD NO.:  [id number]          PATIENT TYPE:  INP   LOCATION:  0104                         FACILITY:  [facility]   PHYSICIAN:  Angelia Mould. Derrell Lolling, M.D.DATE OF BIRTH:  1986/01/20   DATE OF PROCEDURE:  06/27/2005  DATE OF DISCHARGE:                                 OPERATIVE REPORT   PREOPERATIVE DIAGNOSIS:  Acute appendicitis.   POSTOPERATIVE DIAGNOSIS:  Acute appendicitis.   OPERATION PERFORMED:  Laparoscopic appendectomy.   SURGEON:  Angelia Mould. Derrell Lolling, M.D.   OPERATIVE INDICATIONS:  This is an 25 year old white female who presents  with an 18-20 hour history of lower abdominal pain was notable in the right  lower quadrant. On exam, she has localized tenderness in the right lower  quadrant. Her white blood cell count is 18,000, urine pregnancy test  negative, urinalysis normal. CT scan confirms appendicitis. She is brought  to the operating room urgently.   OPERATIVE FINDINGS:  The appendix was acutely inflamed. It  was markedly  thickened especially in the distal half and had a purplish discoloration and  some exudate and inflammatory adhesions. There was no evidence of rupture or  abscess. The terminal ileum and right colon looked normal. The uterus looked  normal. The gallbladder and liver looked normal.   OPERATIVE TECHNIQUE:  Following the induction of general endotracheal  anesthesia, the patient's abdomen was prepped and draped in a sterile  fashion. An 11-mm OptiView port was carefully inserted in the left rectus  muscle above the level of the umbilicus. This was done very carefully and  the insertion went without any trauma. Pneumoperitoneum was created. A video  camera was inserted with visualization and findings as described above. The  5 mm trocar was placed in the left rectus muscle just below the umbilicus  and a 12 mm trocar placed in the suprapubic area. We carefully dissected the  appendix away  from the mesentery of the terminal ileum where it was  adherent. We were able to get this completely free and elevate this. We  divided the appendiceal artery and the appendiceal mesentery using the  harmonic scalpel. We did this in several steps until we had the appendix  completely skeletonized and could clearly visualize the insertion of the  appendix and cecum. We then used an Endo-GIA stapler to staple off the  appendix at the level of the cecum that went well. The appendix was placed  in a specimen bag and removed. We irrigated the pelvis and the operative  field. Everything looked fine. There was no bleeding, the staple line was  perfectly intact. The appendix was placed in a specimen bag and removed.   The trocars were removed under direct vision. There was no bleeding from  trocar sites. Pneumoperitoneum was released. The fascia in the suprapubic  area was closed with 0 Vicryl suture. The incisions were irrigated with  saline  and the skin closed with subcuticular sutures of 4-0 Vicryl and Steri-  Strips. Clean bandages were placed and the patient taken to the recovery  room in stable condition.  Estimated blood loss was about 10 cc.  Complications none.  Sponge, needle and instrument counts were correct.       HMI/MEDQ  D:  06/27/2005  T:  06/27/2005  Job:  [id number]

## 2011-04-25 NOTE — H&P (Signed)
Diane Foley, Diane Foley                ACCOUNT NO.:  [id number]   MEDICAL RECORD NO.:  [id number]          PATIENT TYPE:  INP   LOCATION:  0104                         FACILITY:  [facility]   PHYSICIAN:  Angelia Mould. Derrell Lolling, M.D.DATE OF BIRTH:  [birth year]   DATE OF ADMISSION:  06/27/2005  DATE OF DISCHARGE:                                HISTORY & PHYSICAL   CHIEF COMPLAINT:  Abdominal pain.   HISTORY OF PRESENT ILLNESS:  This is a healthy 25 year old white female  brought to the [facility] emergency room by her mother.  She complains of  lower abdominal pain.  She states that she had the gradual onset of lower  abdominal pain yesterday afternoon that has been progressive.  She denies  diarrhea, nausea, or vomiting.  She denies fever.  She has had a little  chilly feeling.  Her last menstrual period was recent, and her last day of  menstrual flow was yesterday.   She has been evaluated by Dr. Freida Busman in the ER.  Her laboratory work shows a  white blood cell count of 18,000, a normal urinalysis, a negative pregnancy  test, and her CT scan shows an inflamed appendix, consistent with acute  appendicitis.   PAST HISTORY:  She has been healthy.  She has had a couple of minor soft  tissue infections of her abdominal wall and her buttocks, but these have  been minor.  Otherwise, no medical or surgical problems.   CURRENT MEDICATIONS:  None.   DRUG ALLERGIES:  None known.   SOCIAL HISTORY:  She smokes cigarettes.  She drinks alcohol rarely.  She  lives at home with her mother.  She is planning to go to [employer] in the fall.  They live in [place].   FAMILY HISTORY:  Mother living and well.  Father living and has COPD and  heart disease.   REVIEW OF SYSTEMS:  All systems were reviewed.  They are noncontributory,  except as described above.   PHYSICAL EXAMINATION:  GENERAL:  A pleasant young woman in mild to moderate  distress.  She appears more anxious that she appears in pain.  VITAL SIGNS:   Temperature 98.0, pulse 72, respirations 20, blood pressure  118/72.  HEENT:  Eyes - sclerae clear.  Extraocular movements intact.  Ears, nose,  mouth, throat, nose, lips, tongue, and oropharynx are without gross lesions.  NECK:  Supple, nontender.  No mass.  No jugular venous distention.  LUNGS:  Clear to auscultation.  No chest wall tenderness.  HEART:  Regular rate and rhythm.  No murmur.  Radial, femoral, and dorsalis  pedis pulses are palpable.  No peripheral edema.  BREASTS:  Not examined.  ABDOMEN:  Soft, not distended.  Hypoactive bowel sounds.  She has localized  tenderness and involuntary guarding of the right lower quadrant.  I do not  feel a mass.  I do not see any signs of any soft tissue infection in the  abdomen.  EXTREMITIES:  She moves all 4 extremities well without pain or deformity.  In the left gluteal area, there is a 2 mm very  faint red dot, which she says  was an infection, but it does not look very impressive at all.  NEUROLOGIC:  No gross motor or sensory deficit.   ASSESSMENT:  Probable acute appendicitis.   PLAN:  1.  The patient will be taken to the operating room for a diagnostic      laparoscopy and laparoscopic appendectomy.  2.  I have discussed the indication and details of surgery with the patient      and her mother.  Risks and complications have been outlined included,      but not limited to, bleeding, infection, conversion to open laparotomy,      injury to adjacent organs such as the ureter or bladder or colon with      major reconstructive surgery.  They are aware that there may be other      diagnoses such as adnexal disease or diverticulitis that may require an      alterative surgical plan.  Other risks such as cardiac, pulmonary, and      thromboembolic problems have been discussed.  They seem to understand      these issues well.  At this time, all of their questions were answered.      They are in full agreement with this plan.        HMI/MEDQ  D:  06/27/2005  T:  06/27/2005  Job:  [id number]

## 2011-09-01 LAB — URINE MICROSCOPIC-ADD ON

## 2011-09-01 LAB — URINALYSIS, ROUTINE W REFLEX MICROSCOPIC
Glucose, UA: NEGATIVE
Hgb urine dipstick: NEGATIVE
Specific Gravity, Urine: 1.012
pH: 7.5

## 2011-09-01 LAB — URINE CULTURE

## 2011-09-09 LAB — URINE CULTURE

## 2011-09-09 LAB — PREGNANCY, URINE: Preg Test, Ur: NEGATIVE

## 2011-09-09 LAB — URINALYSIS, ROUTINE W REFLEX MICROSCOPIC
Glucose, UA: NEGATIVE
Protein, ur: 30 — AB
Specific Gravity, Urine: 1.018
pH: 7

## 2011-09-09 LAB — URINE MICROSCOPIC-ADD ON

## 2011-09-14 ENCOUNTER — Emergency Department (HOSPITAL_COMMUNITY)
Admission: EM | Admit: 2011-09-14 | Discharge: 2011-09-15 | Disposition: A | Discharge status: Home / Self Care | Payer: Self-pay | Attending: Emergency Medicine | Admitting: Emergency Medicine

## 2011-09-14 DIAGNOSIS — R300 Dysuria: Secondary | ICD-10-CM | POA: Insufficient documentation

## 2011-09-14 DIAGNOSIS — N390 Urinary tract infection, site not specified: Secondary | ICD-10-CM | POA: Insufficient documentation

## 2011-09-14 LAB — URINALYSIS, ROUTINE W REFLEX MICROSCOPIC
Ketones, ur: 15 mg/dL — AB
Nitrite: POSITIVE — AB
Protein, ur: NEGATIVE mg/dL
Urobilinogen, UA: 2 mg/dL — ABNORMAL HIGH (ref 0.0–1.0)

## 2011-09-14 LAB — POCT PREGNANCY, URINE: Preg Test, Ur: NEGATIVE

## 2011-09-17 LAB — URINE CULTURE
Colony Count: 75000
Culture  Setup Time: 201210081052

## 2011-09-25 LAB — URINALYSIS, ROUTINE W REFLEX MICROSCOPIC
Bilirubin Urine: NEGATIVE
Nitrite: NEGATIVE
Urobilinogen, UA: 0.2
pH: 6

## 2011-09-25 LAB — URINE MICROSCOPIC-ADD ON

## 2011-11-25 ENCOUNTER — Encounter: Payer: Self-pay | Admitting: *Deleted

## 2011-11-25 ENCOUNTER — Emergency Department (HOSPITAL_COMMUNITY)
Admission: EM | Admit: 2011-11-25 | Discharge: 2011-11-26 | Disposition: A | Discharge status: Home / Self Care | Payer: Self-pay | Attending: Emergency Medicine | Admitting: Emergency Medicine

## 2011-11-25 DIAGNOSIS — F17200 Nicotine dependence, unspecified, uncomplicated: Secondary | ICD-10-CM | POA: Insufficient documentation

## 2011-11-25 DIAGNOSIS — N920 Excessive and frequent menstruation with regular cycle: Secondary | ICD-10-CM

## 2011-11-25 DIAGNOSIS — N898 Other specified noninflammatory disorders of vagina: Secondary | ICD-10-CM | POA: Insufficient documentation

## 2011-11-25 DIAGNOSIS — R109 Unspecified abdominal pain: Secondary | ICD-10-CM | POA: Insufficient documentation

## 2011-11-25 NOTE — ED Notes (Signed)
Pt in stating she thinks she had a miscarriage, states she had some abnormal vaginal bleeding starting a few days ago, states her period was 2 months late, since that time noted dizziness and weakness over last day

## 2011-11-26 LAB — CBC
HCT: 38.8 % (ref 36.0–46.0)
Hemoglobin: 13.3 g/dL (ref 12.0–15.0)
MCH: 31.1 pg (ref 26.0–34.0)
MCHC: 34.3 g/dL (ref 30.0–36.0)
MCV: 90.7 fL (ref 78.0–100.0)

## 2011-11-26 LAB — DIFFERENTIAL
Basophils Relative: 0 % (ref 0–1)
Eosinophils Absolute: 0.2 10*3/uL (ref 0.0–0.7)
Eosinophils Relative: 2 % (ref 0–5)
Monocytes Absolute: 0.6 10*3/uL (ref 0.1–1.0)
Monocytes Relative: 9 % (ref 3–12)
Neutrophils Relative %: 50 % (ref 43–77)

## 2011-11-26 LAB — WET PREP, GENITAL
Trich, Wet Prep: NONE SEEN
Yeast Wet Prep HPF POC: NONE SEEN

## 2011-11-26 LAB — URINALYSIS, ROUTINE W REFLEX MICROSCOPIC
Hgb urine dipstick: NEGATIVE
Specific Gravity, Urine: 1.027 (ref 1.005–1.030)
Urobilinogen, UA: 0.2 mg/dL (ref 0.0–1.0)

## 2011-11-26 LAB — GC/CHLAMYDIA PROBE AMP, GENITAL: GC Probe Amp, Genital: NEGATIVE

## 2011-11-26 NOTE — ED Notes (Signed)
Pt given discharge instructions and verbalizes understanding  

## 2011-11-26 NOTE — ED Notes (Signed)
Pt to ED with possible miscarriage. Pt states did not have a menstrual cycle for 2 months, but states did not take a pregnancy test so therefore she does not know if she has actually miscarriage. Pt in gown. Pt awaits eval

## 2011-11-26 NOTE — ED Provider Notes (Addendum)
History     CSN: [id number] Arrival date & time: 11/25/2011  9:25 PM   First MD Initiated Contact with Patient 11/26/11 0015      Chief Complaint  Patient presents with  . Vaginal Bleeding    (Consider location/radiation/quality/duration/timing/severity/associated sxs/prior treatment) HPI Comments: Heavy vaginal bleeding starting 4 days ago with large clots after not having her period for 2 months. Intermittent lower abdominal pain and back pain when she was having large clots which is largely resolved. States she is concerned that she may be miscarrying and wanted to be checked out. She denies any vaginal discharge, fever, chest pain, shortness of breath. She does state that she started to feel dizzy with standing and feels that she looks a little pale. No pain currently. No new sexual partners.  Patient is a 25 y.o. female presenting with vaginal bleeding. The history is provided by the patient.  Vaginal Bleeding This is a new problem. Episode onset: 4 days ago. The problem occurs constantly. The problem has been gradually improving. Associated symptoms include abdominal pain. Associated symptoms comments: Mild intermittent abdominal cramping and lower back cramps consistent with what she normally has menses. The symptoms are aggravated by nothing. The symptoms are relieved by nothing. She has tried nothing for the symptoms. The treatment provided no relief.    History reviewed. No pertinent past medical history.  History reviewed. No pertinent past surgical history.  History reviewed. No pertinent family history.  History  Substance Use Topics  . Smoking status: Current Everyday Smoker  . Smokeless tobacco: Not on file  . Alcohol Use: No    OB History    Grav Para Term Preterm Abortions TAB SAB Ect Mult Living                  Review of Systems  Gastrointestinal: Positive for abdominal pain.  Genitourinary: Positive for vaginal bleeding.  All other systems reviewed and  are negative.    Allergies  Review of patient's allergies indicates no known allergies.  Home Medications   Current Outpatient Rx  Name Route Sig Dispense Refill  . METHADONE HCL 10 MG/ML PO CONC Oral Take 70 mg by mouth once.        BP 147/82  Pulse 107  Temp(Src) 98.2 F (36.8 C) (Oral)  Resp 20  SpO2 100%  Physical Exam  Nursing note and vitals reviewed. Constitutional: She is oriented to person, place, and time. She appears well-developed and well-nourished. No distress.  HENT:  Head: Normocephalic and atraumatic.  Mouth/Throat: Oropharynx is clear and moist.  Eyes: Conjunctivae and EOM are normal. Pupils are equal, round, and reactive to light.  Cardiovascular: Normal rate, regular rhythm, normal heart sounds and intact distal pulses.  Exam reveals no friction rub.   No murmur heard. Pulmonary/Chest: Effort normal and breath sounds normal. She has no wheezes. She has no rales.  Abdominal: Soft. Bowel sounds are normal. She exhibits no distension. There is no tenderness. There is no rebound and no guarding.  Genitourinary: Uterus normal. Cervix exhibits no motion tenderness and no discharge. Right adnexum displays no mass and no tenderness. Left adnexum displays no mass and no tenderness. There is bleeding around the vagina. No tenderness around the vagina. No vaginal discharge found.  Musculoskeletal: Normal range of motion. She exhibits no tenderness.       No edema  Neurological: She is alert and oriented to person, place, and time. No cranial nerve deficit.  Skin: Skin is warm and dry. No  rash noted.  Psychiatric: She has a normal mood and affect. Her behavior is normal.    ED Course  Procedures (including critical care time)  Labs Reviewed  URINALYSIS, ROUTINE W REFLEX MICROSCOPIC - Abnormal; Notable for the following:    APPearance CLOUDY (*)    All other components within normal limits  WET PREP, GENITAL - Abnormal; Notable for the following:    WBC, Wet  Prep HPF POC RARE (*)    All other components within normal limits  CBC  DIFFERENTIAL  POCT PREGNANCY, URINE  POCT PREGNANCY, URINE  GC/CHLAMYDIA PROBE AMP, GENITAL   No results found.   No diagnosis found.    MDM   Patient with heavy vaginal bleeding for the last 4 days. She states initially she had extremely large clots and now it is more just bleeding. She states that she is not felt herself the last few days with some mild dizziness with standing. She denies any fever. Her last menses was 2 months ago. She states she does have irregular periods this is not uncommon. However she was concerned that maybe she was having a miscarriage. On exam the patient has mild vaginal bleeding without any cervical motion tenderness, ovarian tenderness. Cervical os is closed. Will check CBC to 2 recent weakness and dizziness and heavy bleeding, UA, UPT, wet prep, GC chlamydia. 1:58 AM CBC within normal limits he urine without signs of infection wet prep within normal limits. Urine pregnancy negative. Most likely menorrhagia due to missing the last 2 cycles. Will discharge home.        Gwyneth Sprout, MD 11/26/11 2130  Gwyneth Sprout, MD 11/26/11 8657

## 2012-01-11 ENCOUNTER — Emergency Department (HOSPITAL_COMMUNITY)
Admission: EM | Admit: 2012-01-11 | Discharge: 2012-01-11 | Disposition: A | Discharge status: Home / Self Care | Payer: Self-pay | Attending: Emergency Medicine | Admitting: Emergency Medicine

## 2012-01-11 ENCOUNTER — Encounter (HOSPITAL_COMMUNITY): Payer: Self-pay | Admitting: Emergency Medicine

## 2012-01-11 DIAGNOSIS — N390 Urinary tract infection, site not specified: Secondary | ICD-10-CM | POA: Insufficient documentation

## 2012-01-11 DIAGNOSIS — R300 Dysuria: Secondary | ICD-10-CM | POA: Insufficient documentation

## 2012-01-11 HISTORY — DX: Urinary tract infection, site not specified: N39.0

## 2012-01-11 LAB — URINALYSIS, ROUTINE W REFLEX MICROSCOPIC
Bilirubin Urine: NEGATIVE
Glucose, UA: NEGATIVE mg/dL
Ketones, ur: NEGATIVE mg/dL
Protein, ur: 30 mg/dL — AB

## 2012-01-11 LAB — URINE MICROSCOPIC-ADD ON

## 2012-01-11 MED ORDER — NITROFURANTOIN MONOHYD MACRO 100 MG PO CAPS: 100.0000 mg | ORAL_CAPSULE | Freq: Two times a day (BID) | ORAL | Status: AC

## 2012-01-11 NOTE — ED Notes (Signed)
Signature pad not working patient verbalized understanding of instructions with no further questions

## 2012-01-11 NOTE — ED Notes (Signed)
Patient complaining of a UTI for the past two weeks (symptoms including burning during urination); patient has been taking AZO standard -- has provided little relief.  Patient states that she is UTI-prone and gets them frequently.

## 2012-01-11 NOTE — ED Provider Notes (Signed)
History     CSN: [id number]  Arrival date & time 01/11/12  0304   First MD Initiated Contact with Patient 01/11/12 [phone]      Chief Complaint  Patient presents with  . Urinary Tract Infection    (Consider location/radiation/quality/duration/timing/severity/associated sxs/prior treatment) HPI The patient presents to the emergency department with symptoms of urinary tract infection for the last 2 weeks.  She states she has had these frequently in the past and this is similar to her previous episodes.  She denies abdominal pain, fever, nausea/vomiting, diarrhea, weakness, or headache.  Status she said some burning and pain with urination.  She denies vaginal discharge or pelvic pain at this time.  Past Medical History  Diagnosis Date  . UTI (urinary tract infection)     Past Surgical History  Procedure Date  . Appendectomy     No family history on file.  History  Substance Use Topics  . Smoking status: Current Everyday Smoker  . Smokeless tobacco: Not on file  . Alcohol Use: No    OB History    Grav Para Term Preterm Abortions TAB SAB Ect Mult Living                  Review of Systems All pertinent positives and negatives reviewed in the history of present illness  Allergies  Review of patient's allergies indicates no known allergies.  Home Medications   Current Outpatient Rx  Name Route Sig Dispense Refill  . METHADONE HCL 10 MG/ML PO CONC Oral Take 70 mg by mouth once.      . AZO-STANDARD PO Oral Take 2 tablets by mouth every 4 (four) hours as needed. For UTI pain.      BP 125/73  Pulse 94  Temp(Src) 97.9 F (36.6 C) (Oral)  Resp 16  SpO2 97%  LMP 12/15/2011  Physical Exam  Constitutional: She appears well-developed and well-nourished.  HENT:  Head: Normocephalic and atraumatic.  Cardiovascular: Normal rate, regular rhythm and normal heart sounds.  Exam reveals no gallop and no friction rub.   No murmur heard. Pulmonary/Chest: Effort normal and  breath sounds normal. No respiratory distress. She has no wheezes. She has no rales.  Abdominal: Soft. Bowel sounds are normal. She exhibits no distension. There is no tenderness. There is no rebound and no guarding.  Skin: Skin is warm and dry. No rash noted.    ED Course  Procedures (including critical care time)  Labs Reviewed  URINALYSIS, ROUTINE W REFLEX MICROSCOPIC - Abnormal; Notable for the following:    Color, Urine AMBER (*) BIOCHEMICALS MAY BE AFFECTED BY COLOR   APPearance TURBID (*)    Hgb urine dipstick LARGE (*)    Protein, ur 30 (*)    Leukocytes, UA LARGE (*)    All other components within normal limits  URINE MICROSCOPIC-ADD ON - Abnormal; Notable for the following:    Squamous Epithelial / LPF FEW (*)    Bacteria, UA FEW (*)    All other components within normal limits  URINE CULTURE   Patient has findings that would be consistent with UTI plus based on her history of present illness we will treat as such.  She is to increase her fluid intake.  We will give her urology referral.  Told to return here as needed for any worsening in her condition      MDM   MDM Reviewed: nursing note, vitals and previous chart Reviewed previous: labs Interpretation: labs  Carlyle Dolly, PA-C 01/11/12 [phone]

## 2012-01-11 NOTE — ED Provider Notes (Signed)
Medical screening examination/treatment/procedure(s) were performed by non-physician practitioner and as supervising physician I was immediately available for consultation/collaboration.  Leasia Swann L Gaye Scorza, MD 01/11/12 0745 

## 2012-01-11 NOTE — ED Notes (Addendum)
Patient states has a history of chronic UTI's states after intercourse she gets them everytime. Reports current episode starting a couple days ago, no relief with home remedies

## 2012-01-13 ENCOUNTER — Encounter (HOSPITAL_COMMUNITY): Payer: Self-pay

## 2012-01-13 ENCOUNTER — Emergency Department (HOSPITAL_COMMUNITY)
Admission: EM | Admit: 2012-01-13 | Discharge: 2012-01-14 | Disposition: A | Discharge status: Other Acute Inpatient Hospital | Payer: Self-pay | Attending: Emergency Medicine | Admitting: Emergency Medicine

## 2012-01-13 DIAGNOSIS — F17200 Nicotine dependence, unspecified, uncomplicated: Secondary | ICD-10-CM | POA: Insufficient documentation

## 2012-01-13 DIAGNOSIS — Z79899 Other long term (current) drug therapy: Secondary | ICD-10-CM | POA: Insufficient documentation

## 2012-01-13 DIAGNOSIS — F3130 Bipolar disorder, current episode depressed, mild or moderate severity, unspecified: Secondary | ICD-10-CM | POA: Insufficient documentation

## 2012-01-13 DIAGNOSIS — F32A Depression, unspecified: Secondary | ICD-10-CM

## 2012-01-13 DIAGNOSIS — F329 Major depressive disorder, single episode, unspecified: Secondary | ICD-10-CM

## 2012-01-13 DIAGNOSIS — F411 Generalized anxiety disorder: Secondary | ICD-10-CM | POA: Insufficient documentation

## 2012-01-13 HISTORY — DX: Bipolar disorder, unspecified: F31.9

## 2012-01-13 HISTORY — DX: Major depressive disorder, single episode, unspecified: F32.9

## 2012-01-13 HISTORY — DX: Depression, unspecified: F32.A

## 2012-01-13 HISTORY — DX: Sepsis, unspecified organism: A41.9

## 2012-01-13 HISTORY — DX: Post-traumatic stress disorder, unspecified: F43.10

## 2012-01-13 HISTORY — DX: Anxiety disorder, unspecified: F41.9

## 2012-01-13 LAB — BASIC METABOLIC PANEL
BUN: 11 mg/dL (ref 6–23)
Chloride: 99 mEq/L (ref 96–112)
Creatinine, Ser: 0.62 mg/dL (ref 0.50–1.10)
GFR calc Af Amer: >90 mL/min (ref >90)
Glucose, Bld: 84 mg/dL (ref 70–99)

## 2012-01-13 LAB — CBC
HCT: 36.2 % (ref 36.0–46.0)
Hemoglobin: 12.3 g/dL (ref 12.0–15.0)
MCHC: 34 g/dL (ref 30.0–36.0)
MCV: 90.7 fL (ref 78.0–100.0)
RDW: 12.9 % (ref 11.5–15.5)
WBC: 9.2 10*3/uL (ref 4.0–10.5)

## 2012-01-13 LAB — RAPID URINE DRUG SCREEN, HOSP PERFORMED
Cocaine: NOT DETECTED
Opiates: NOT DETECTED

## 2012-01-13 LAB — URINE CULTURE
Colony Count: 25000
Culture  Setup Time: 201302031133

## 2012-01-13 MED ORDER — ACETAMINOPHEN 325 MG PO TABS: 650.0000 mg | ORAL_TABLET | ORAL | Status: DC | PRN

## 2012-01-13 MED ORDER — NICOTINE 21 MG/24HR TD PT24: 21.0000 mg | MEDICATED_PATCH | Freq: Every day | TRANSDERMAL | Status: DC

## 2012-01-13 MED ORDER — LORAZEPAM 1 MG PO TABS
1.0000 mg | ORAL_TABLET | Freq: Three times a day (TID) | ORAL | Status: DC | PRN
  Administered 2012-01-13 – 2012-01-14 (×2): 1 mg via ORAL
  Filled 2012-01-13 (×2): qty 1

## 2012-01-13 NOTE — ED Notes (Signed)
Pt's belongings have been searched by security and pt has been wanded.

## 2012-01-13 NOTE — ED Notes (Signed)
Reported to TCU RN about the pt.  The only TCU room is not yet clean. Pt will be placed in blue scrubs w/the rest of the v70.1 protocol initiated.  Pt has gone to use the restroom and will change into blue scrubs.

## 2012-01-13 NOTE — ED Notes (Signed)
Will move pt to the psych ED when a room is available.

## 2012-01-13 NOTE — ED Notes (Signed)
Care assumed

## 2012-01-13 NOTE — ED Notes (Signed)
After pt used the bathroom, she stated that she had bright red rectal bleeding and that it's been a problem for about a year.  EDP made aware.

## 2012-01-13 NOTE — ED Provider Notes (Signed)
History     CSN: [id number]  Arrival date & time 01/13/12  1439   First MD Initiated Contact with Patient 01/13/12 1524      Chief Complaint  Patient presents with  . V70.1    "dumped by my boyfriend, feeling suicidal, don't think I'd actually do it but I'm in alot of pain"    (Consider location/radiation/quality/duration/timing/severity/associated sxs/prior treatment) The history is provided by the patient.  pt states recent breakup from boyfriend has is causing her anxiety, feelings of depression, and states has had fleeting thoughts of suicide. States 'I dont want to hurt myself and know I never would', but occasionally have thoughts. No plan or attempt. No od. States did take 2 oxycontin last pm which is the first time she has taken any narcotics, other than her normal methadone dose, in 2 years. States also in past 1-2 days has take a couple klonopin - notes hx panic attacks, but denies regular or long standing bzd use or abuse. Had her normal methadone today, has been in program x 2 years re opiate abuse. Denies etoh abuse. Denies any recent health problems. No abd pain. No nvd. No headaches. No cp or sob. Is having trouble sleeping at night. Normal appetite.   Past Medical History  Diagnosis Date  . UTI (urinary tract infection)   . Sepsis   . Suicidal ideation   . Bipolar 1 disorder     bipolar  . Depression   . Anxiety   . PTSD (post-traumatic stress disorder)     Past Surgical History  Procedure Date  . Appendectomy     No family history on file.  History  Substance Use Topics  . Smoking status: Current Everyday Smoker -- 0.3 packs/day  . Smokeless tobacco: Not on file  . Alcohol Use: Yes     occasionally    OB History    Grav Para Term Preterm Abortions TAB SAB Ect Mult Living                  Review of Systems  Constitutional: Negative for fever and chills.  HENT: Negative for neck pain.   Eyes: Negative for redness.  Respiratory: Negative for  shortness of breath.   Cardiovascular: Negative for chest pain.  Gastrointestinal: Negative for abdominal pain.  Genitourinary: Negative for flank pain.  Musculoskeletal: Negative for back pain.  Skin: Negative for rash.  Neurological: Negative for headaches.  Hematological: Does not bruise/bleed easily.  Psychiatric/Behavioral: Negative for confusion.    Allergies  Review of patient's allergies indicates no known allergies.  Home Medications   Current Outpatient Rx  Name Route Sig Dispense Refill  . CLONAZEPAM 0.5 MG PO TABS Oral Take 1 mg by mouth 2 (two) times daily as needed. Panic attack    . METHADONE HCL 10 MG/ML PO CONC Oral Take 45 mg by mouth once.     Marland Kitchen NITROFURANTOIN MONOHYD MACRO 100 MG PO CAPS Oral Take 1 capsule (100 mg total) by mouth 2 (two) times daily. 14 capsule 0  . OXYCODONE HCL ER 40 MG PO TB12 Oral Take 80 mg by mouth every 12 (twelve) hours. Was in pain    . AZO-STANDARD PO Oral Take 2 tablets by mouth every 4 (four) hours as needed. For UTI pain.      BP 117/79  Pulse 71  Temp(Src) 98.1 F (36.7 C) (Oral)  Resp 16  Ht 5\' 3"  (1.6 m)  Wt 140 lb (63.504 kg)  BMI 24.80 kg/m2  SpO2 95%  LMP 12/15/2011  Physical Exam  Nursing note and vitals reviewed. Constitutional: She is oriented to person, place, and time. She appears well-developed and well-nourished. No distress.  HENT:  Head: Atraumatic.  Eyes: Conjunctivae are normal. Pupils are equal, round, and reactive to light. No scleral icterus.  Neck: Neck supple. No tracheal deviation present.  Cardiovascular: Normal rate, regular rhythm, normal heart sounds and intact distal pulses.   Pulmonary/Chest: Effort normal and breath sounds normal. No respiratory distress.  Abdominal: Soft. Normal appearance and bowel sounds are normal. She exhibits no distension. There is no tenderness.  Musculoskeletal: She exhibits no edema.  Neurological: She is alert and oriented to person, place, and time.        Steady gait  Skin: Skin is warm and dry. No rash noted.  Psychiatric:       Mildly anxious. Depressed. Currently denies SI.     ED Course  Procedures (including critical care time)   Labs Reviewed  CBC  BASIC METABOLIC PANEL  PREGNANCY, URINE  URINE RAPID DRUG SCREEN (HOSP PERFORMED)  ETHANOL    Results for orders placed during the hospital encounter of 01/13/12  CBC      Component Value Range   WBC 9.2  4.0 - 10.5 (K/uL)   RBC 3.99  3.87 - 5.11 (MIL/uL)   Hemoglobin 12.3  12.0 - 15.0 (g/dL)   HCT 09.6  04.5 - 40.9 (%)   MCV 90.7  78.0 - 100.0 (fL)   MCH 30.8  26.0 - 34.0 (pg)   MCHC 34.0  30.0 - 36.0 (g/dL)   RDW 81.1  91.4 - 78.2 (%)   Platelets 346  150 - 400 (K/uL)  BASIC METABOLIC PANEL      Component Value Range   Sodium 137  135 - 145 (mEq/L)   Potassium 3.6  3.5 - 5.1 (mEq/L)   Chloride 99  96 - 112 (mEq/L)   CO2 25  19 - 32 (mEq/L)   Glucose, Bld 84  70 - 99 (mg/dL)   BUN 11  6 - 23 (mg/dL)   Creatinine, Ser 9.56  0.50 - 1.10 (mg/dL)   Calcium 9.3  8.4 - 21.3 (mg/dL)   GFR calc non Af Amer >90  >90 (mL/min)   GFR calc Af Amer >90  >90 (mL/min)  PREGNANCY, URINE      Component Value Range   Preg Test, Ur NEGATIVE  NEGATIVE   URINE RAPID DRUG SCREEN (HOSP PERFORMED)      Component Value Range   Opiates NONE DETECTED  NONE DETECTED    Cocaine NONE DETECTED  NONE DETECTED    Benzodiazepines NONE DETECTED  NONE DETECTED    Amphetamines NONE DETECTED  NONE DETECTED    Tetrahydrocannabinol NONE DETECTED  NONE DETECTED    Barbiturates NONE DETECTED  NONE DETECTED   ETHANOL      Component Value Range   Alcohol, Ethyl (B) <11  0 - 11 (mg/dL)       MDM  Reviewed nursing notes, labs sent. beh health team called to assess.   Pt was brought to ed by mobile crisis team, they have ivc papers, they are working on placement of patient.       Suzi Roots, MD 01/15/12 [phone]

## 2012-01-13 NOTE — ED Notes (Signed)
Will give Ativan once orders are received

## 2012-01-13 NOTE — ED Notes (Signed)
Report given to sheila, rn in psych ed.  The pt and her 2 pt belongings bags (1 duffle bag, 1 pt belongings bag both labeled) went w/security officer to psych ed rm 36 via ambulation.  Pt steady on feet.
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
## 2012-01-14 MED ORDER — METHADONE HCL 10 MG/ML PO CONC
45.0000 mg | Freq: Every day | ORAL | Status: DC
  Administered 2012-01-14: 45 mg via ORAL
  Filled 2012-01-14: qty 4.5

## 2012-01-14 MED ORDER — METHADONE HCL 5 MG PO TABS: 45.0000 mg | ORAL_TABLET | Freq: Every day | ORAL | Status: DC

## 2012-01-14 MED ORDER — NITROFURANTOIN MONOHYD MACRO 100 MG PO CAPS
100.0000 mg | ORAL_CAPSULE | Freq: Two times a day (BID) | ORAL | Status: DC
  Administered 2012-01-14: 100 mg via ORAL
  Filled 2012-01-14 (×4): qty 1

## 2012-01-14 NOTE — ED Notes (Signed)
C/o anxiety to MD when she was visiting, medicated

## 2012-01-14 NOTE — ED Notes (Signed)
Was being visited by her mother when she started yelling at her, mother came out of room and left the hospital

## 2012-01-14 NOTE — ED Notes (Signed)
+   Urine Patient treated with macrobid-sensitive to same-chart appended per protocol MD. 

## 2012-01-14 NOTE — ED Provider Notes (Signed)
Patient requesting daily methadone. Also previously diagnosed with UTI 2/3 on macrobid. Ordered.   Per nursing staff telepsych consult previously done--requested final consult. Will retry  Forbes Cellar, MD 01/14/12 [phone]

## 2012-01-14 NOTE — ED Provider Notes (Signed)
Tele psych consult received. Recommended to rescind IVC. Mobile Crisis disagrees. Re- evaluated patient. She had SI with plan yesterday. She has access to guns at her ex-boyfriends house only.  She no longer states she's suicidal but she does feel that she needs admission for medication and stabilization. She states "I just don't know how long I'm going to be here, I hate being in this room"  Forbes Cellar, MD 01/14/12 1207

## 2012-01-14 NOTE — ED Provider Notes (Addendum)
BP 105/56  Pulse 76  Temp(Src) 97.9 F (36.6 C) (Oral)  Resp 18  Ht 5\' 3"  (1.6 m)  Wt 140 lb (63.504 kg)  BMI 24.80 kg/m2  SpO2 96%  LMP 12/15/2011  I have seen and evaluated the patient. She has no complaints at this time. She is awaiting placement. I discussed her case with the ACT team who state that mobile crisis is working on her disposition. Telepsych consult requested.  [id number] D/W Nursing staff-- telepsych consult paperwork in chart but no official consult completed. Will resend  Forbes Cellar, MD 01/14/12 8295  Forbes Cellar, MD 01/14/12 [phone]

## 2012-01-14 NOTE — ED Notes (Signed)
Asked patient if she would like a shower. Patient declined at this time.

## 2012-01-14 NOTE — ED Notes (Signed)
On phone taking w/aunt

## 2012-01-14 NOTE — ED Notes (Signed)
TelePsych consult completed 

## 2012-05-03 ENCOUNTER — Encounter (HOSPITAL_COMMUNITY): Payer: Self-pay | Admitting: *Deleted

## 2012-05-03 ENCOUNTER — Emergency Department (HOSPITAL_COMMUNITY)
Admission: EM | Admit: 2012-05-03 | Discharge: 2012-05-03 | Disposition: A | Discharge status: Home / Self Care | Payer: Self-pay | Attending: Emergency Medicine | Admitting: Emergency Medicine

## 2012-05-03 DIAGNOSIS — N390 Urinary tract infection, site not specified: Secondary | ICD-10-CM | POA: Insufficient documentation

## 2012-05-03 DIAGNOSIS — R300 Dysuria: Secondary | ICD-10-CM | POA: Insufficient documentation

## 2012-05-03 LAB — POCT PREGNANCY, URINE: Preg Test, Ur: NEGATIVE

## 2012-05-03 LAB — URINALYSIS, ROUTINE W REFLEX MICROSCOPIC
Glucose, UA: NEGATIVE mg/dL
Specific Gravity, Urine: 1.025 (ref 1.005–1.030)
pH: 5 (ref 5.0–8.0)

## 2012-05-03 LAB — URINE MICROSCOPIC-ADD ON

## 2012-05-03 MED ORDER — PHENAZOPYRIDINE HCL 200 MG PO TABS: 200.0000 mg | ORAL_TABLET | Freq: Three times a day (TID) | ORAL | Status: AC

## 2012-05-03 MED ORDER — NITROFURANTOIN MONOHYD MACRO 100 MG PO CAPS: 100.0000 mg | ORAL_CAPSULE | Freq: Two times a day (BID) | ORAL | Status: AC

## 2012-05-03 NOTE — ED Provider Notes (Signed)
History     CSN: [id number]  Arrival date & time 05/03/12  1522   First MD Initiated Contact with Patient 05/03/12 1819      Chief Complaint  Patient presents with  . Urinary Frequency    (Consider location/radiation/quality/duration/timing/severity/associated sxs/prior treatment) Patient is a 26 y.o. female presenting with frequency. The history is provided by the patient.  Urinary Frequency This is a new problem. The current episode started 1 to 4 weeks ago. The problem occurs constantly. The problem has been gradually worsening. Pertinent negatives include no abdominal pain, fever, nausea or vomiting. Associated symptoms comments: Burning with urination and frequency like previous multiple UTIs. No fever, abdominal pain or nausea..    Past Medical History  Diagnosis Date  . UTI (urinary tract infection)   . Sepsis   . Suicidal ideation   . Bipolar 1 disorder     bipolar  . Depression   . Anxiety   . PTSD (post-traumatic stress disorder)     Past Surgical History  Procedure Date  . Appendectomy     No family history on file.  History  Substance Use Topics  . Smoking status: Current Everyday Smoker -- 0.3 packs/day  . Smokeless tobacco: Not on file  . Alcohol Use: Yes     occasionally    OB History    Grav Para Term Preterm Abortions TAB SAB Ect Mult Living                  Review of Systems  Constitutional: Negative for fever.  Gastrointestinal: Negative for nausea, vomiting and abdominal pain.  Genitourinary: Positive for dysuria and frequency. Negative for vaginal bleeding and vaginal discharge.    Allergies  Review of patient's allergies indicates no known allergies.  Home Medications   Current Outpatient Rx  Name Route Sig Dispense Refill  . METHADONE HCL 10 MG/ML PO CONC Oral Take 15 mg by mouth once.       BP 123/69  Pulse 92  Temp(Src) 98.3 F (36.8 C) (Oral)  Resp 16  Ht 5\' 3"  (1.6 m)  Wt 130 lb 15.3 oz (59.4 kg)  BMI 23.20 kg/m2   SpO2 97%  Physical Exam  Constitutional: She is oriented to person, place, and time. She appears well-developed and well-nourished. No distress.  Pulmonary/Chest: Effort normal.  Abdominal: Soft. There is no tenderness. There is no rebound.  Genitourinary:       No CVA tenderness.  Neurological: She is alert and oriented to person, place, and time.  Skin: Skin is warm and dry.    ED Course  Procedures (including critical care time)  Labs Reviewed  URINALYSIS, ROUTINE W REFLEX MICROSCOPIC - Abnormal; Notable for the following:    Color, Urine RED (*) BIOCHEMICALS MAY BE AFFECTED BY COLOR   APPearance CLOUDY (*)    Hgb urine dipstick TRACE (*)    Bilirubin Urine SMALL (*)    Ketones, ur 15 (*)    Protein, ur 30 (*)    Urobilinogen, UA 4.0 (*)    Nitrite POSITIVE (*)    Leukocytes, UA LARGE (*)    All other components within normal limits  URINE MICROSCOPIC-ADD ON - Abnormal; Notable for the following:    Squamous Epithelial / LPF FEW (*)    Bacteria, UA MANY (*)    All other components within normal limits  POCT PREGNANCY, URINE   No results found.   No diagnosis found. 1. UTI    MDM  UTI on UA with  simple UTI symptoms.         Rodena Medin, PA-C 05/03/12 1829

## 2012-05-03 NOTE — ED Notes (Signed)
Pt has history of UTI's, presents with symptoms of frequency and pain

## 2012-05-03 NOTE — Discharge Instructions (Signed)

## 2012-05-04 NOTE — ED Provider Notes (Signed)
Medical screening examination/treatment/procedure(s) were performed by non-physician practitioner and as supervising physician I was immediately available for consultation/collaboration.  Toy Baker, MD 05/04/12 2228

## 2012-05-12 ENCOUNTER — Emergency Department (HOSPITAL_COMMUNITY)
Admission: EM | Admit: 2012-05-12 | Discharge: 2012-05-12 | Disposition: A | Discharge status: Rehabilitation Facility | Payer: Self-pay | Attending: Emergency Medicine | Admitting: Emergency Medicine

## 2012-05-12 ENCOUNTER — Encounter (HOSPITAL_COMMUNITY): Payer: Self-pay

## 2012-05-12 DIAGNOSIS — F1110 Opioid abuse, uncomplicated: Secondary | ICD-10-CM | POA: Insufficient documentation

## 2012-05-12 DIAGNOSIS — F411 Generalized anxiety disorder: Secondary | ICD-10-CM | POA: Insufficient documentation

## 2012-05-12 DIAGNOSIS — F329 Major depressive disorder, single episode, unspecified: Secondary | ICD-10-CM

## 2012-05-12 DIAGNOSIS — F17200 Nicotine dependence, unspecified, uncomplicated: Secondary | ICD-10-CM | POA: Insufficient documentation

## 2012-05-12 DIAGNOSIS — F319 Bipolar disorder, unspecified: Secondary | ICD-10-CM | POA: Insufficient documentation

## 2012-05-12 DIAGNOSIS — F32A Depression, unspecified: Secondary | ICD-10-CM

## 2012-05-12 DIAGNOSIS — F1120 Opioid dependence, uncomplicated: Secondary | ICD-10-CM

## 2012-05-12 HISTORY — DX: Other psychoactive substance abuse, uncomplicated: F19.10

## 2012-05-12 LAB — DIFFERENTIAL
Eosinophils Relative: 0 % (ref 0–5)
Lymphocytes Relative: 11 % — ABNORMAL LOW (ref 12–46)
Lymphs Abs: 1 10*3/uL (ref 0.7–4.0)
Monocytes Relative: 9 % (ref 3–12)

## 2012-05-12 LAB — COMPREHENSIVE METABOLIC PANEL
ALT: 10 U/L (ref 0–35)
Alkaline Phosphatase: 53 U/L (ref 39–117)
BUN: 6 mg/dL (ref 6–23)
CO2: 22 mEq/L (ref 19–32)
GFR calc Af Amer: >90 mL/min (ref >90)
GFR calc non Af Amer: >90 mL/min (ref >90)
Glucose, Bld: 97 mg/dL (ref 70–99)
Potassium: 3.8 mEq/L (ref 3.5–5.1)
Sodium: 136 mEq/L (ref 135–145)

## 2012-05-12 LAB — CBC
Hemoglobin: 13.3 g/dL (ref 12.0–15.0)
MCV: 89 fL (ref 78.0–100.0)
Platelets: 240 10*3/uL (ref 150–400)
RBC: 4.37 MIL/uL (ref 3.87–5.11)
WBC: 9.4 10*3/uL (ref 4.0–10.5)

## 2012-05-12 LAB — URINALYSIS, ROUTINE W REFLEX MICROSCOPIC
Nitrite: NEGATIVE
Specific Gravity, Urine: 1.031 — ABNORMAL HIGH (ref 1.005–1.030)
pH: 6 (ref 5.0–8.0)

## 2012-05-12 LAB — RAPID URINE DRUG SCREEN, HOSP PERFORMED: Barbiturates: NOT DETECTED

## 2012-05-12 LAB — URINE MICROSCOPIC-ADD ON

## 2012-05-12 LAB — POCT PREGNANCY, URINE: Preg Test, Ur: NEGATIVE

## 2012-05-12 MED ORDER — LORAZEPAM 1 MG PO TABS
1.0000 mg | ORAL_TABLET | ORAL | Status: DC | PRN
  Administered 2012-05-12: 1 mg via ORAL
  Filled 2012-05-12: qty 1

## 2012-05-12 MED ORDER — METHOCARBAMOL 500 MG PO TABS
500.0000 mg | ORAL_TABLET | Freq: Three times a day (TID) | ORAL | Status: DC | PRN
  Filled 2012-05-12: qty 1

## 2012-05-12 MED ORDER — ONDANSETRON 4 MG PO TBDP
4.0000 mg | ORAL_TABLET | Freq: Four times a day (QID) | ORAL | Status: DC | PRN
  Administered 2012-05-12: 4 mg via ORAL
  Filled 2012-05-12: qty 1

## 2012-05-12 MED ORDER — ONDANSETRON HCL 4 MG PO TABS: 4.0000 mg | ORAL_TABLET | Freq: Three times a day (TID) | ORAL | Status: DC | PRN

## 2012-05-12 MED ORDER — LOPERAMIDE HCL 2 MG PO CAPS: 2.0000 mg | ORAL_CAPSULE | ORAL | Status: DC | PRN

## 2012-05-12 MED ORDER — HYDROXYZINE HCL 25 MG PO TABS: 25.0000 mg | ORAL_TABLET | Freq: Four times a day (QID) | ORAL | Status: DC | PRN

## 2012-05-12 MED ORDER — ACETAMINOPHEN 325 MG PO TABS: 650.0000 mg | ORAL_TABLET | ORAL | Status: DC | PRN

## 2012-05-12 MED ORDER — ALUM & MAG HYDROXIDE-SIMETH 200-200-20 MG/5ML PO SUSP: 30.0000 mL | ORAL | Status: DC | PRN

## 2012-05-12 MED ORDER — NICOTINE 21 MG/24HR TD PT24
21.0000 mg | MEDICATED_PATCH | Freq: Every day | TRANSDERMAL | Status: DC
  Administered 2012-05-12: 21 mg via TRANSDERMAL
  Filled 2012-05-12: qty 1

## 2012-05-12 MED ORDER — NAPROXEN 500 MG PO TABS: 500.0000 mg | ORAL_TABLET | Freq: Two times a day (BID) | ORAL | Status: DC | PRN

## 2012-05-12 MED ORDER — IBUPROFEN 600 MG PO TABS: 600.0000 mg | ORAL_TABLET | Freq: Three times a day (TID) | ORAL | Status: DC | PRN

## 2012-05-12 MED ORDER — DICYCLOMINE HCL 20 MG PO TABS: 20.0000 mg | ORAL_TABLET | Freq: Four times a day (QID) | ORAL | Status: DC | PRN

## 2012-05-12 MED ORDER — ZOLPIDEM TARTRATE 10 MG PO TABS: 10.0000 mg | ORAL_TABLET | Freq: Every evening | ORAL | Status: DC | PRN

## 2012-05-12 NOTE — Progress Notes (Signed)
Invited pt to behavioral health group in [facility], pt stated interest but also stated she was not feeling well d/t detox from opiates. Returned to talk w/ pt after group did not materialize and spoke to pt for about . 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 Pt reported she had been going to methadone clinic and was clean, then her dose was being reduced too quickly and she relapsed. Pt reported she knew she did not want to stay on opiates so she came to ED voluntarily for help w/ detox and staying clean. Engaged pt in discussion about how she plans to stay clean after d/c, pt stated she was unsure. Pt stated she had been to [facility] before and likes the support there, may go back. Pt stated she had a place to stay but has no job or insurance at current. Pt reported that she would like to see counselor outpatient after discharge but didn't know how to pay for it, may need financial/social support/mental health assistance upon discharge to assist in preventing relapse.  Keiko Myricks B MS, LPCA, NCC

## 2012-05-12 NOTE — ED Notes (Signed)
Awaiting pt's arrival to unit. 

## 2012-05-12 NOTE — [facility] Counselor (Addendum)
TC with Olegario Messier @ [facility]. Completed screening of pt and faxed labs. Confirmed pt would need ride if they accepted her. Awaiting call back from [facility] upon reviewing info.  TC from [facility] @ [facility]. Pt accepted and they will pick her up at 4pm. Updated RN & EDP.

## 2012-05-12 NOTE — ED Notes (Signed)
Patient changed into blue scrubs. Patient and patient belongings wanded by security.

## 2012-05-12 NOTE — ED Notes (Signed)
Pt discharged to [facility] via [facility] volunteer transporter. Instructions reviewed. Pt. Verbalized understanding. Denies SI/HI.

## 2012-05-12 NOTE — ED Notes (Signed)
Pt to be discharged to [facility] around 4 p.m. Per ACT counselor. Report called to [facility].

## 2012-05-12 NOTE — ED Provider Notes (Signed)
History     CSN: [id number]  Arrival date & time 05/12/12  0807   First MD Initiated Contact with Patient 05/12/12 [phone]      Chief Complaint  Patient presents with  . Medical Clearance    opiates/heroin    (Consider location/radiation/quality/duration/timing/severity/associated sxs/prior treatment) HPI  Patient relates she started using heroin when she was 26 years old. She relates her last detox was about 4 years ago when she remained sober about 63 days and relapsed. She's been going to a methadone clinic for the past 2 years and has been slow leak tapering herself down from 120 mg a day to 10 mg a day. She relates she missed a day over Labor Day weekend and she was dropped down to 7 mg a day and she quit going 8 days ago. She has relapsed and started using heroin again multiple times a day. She relates she last used about 3 PM yesterday. She states last night she started getting nausea, vomiting, feeling hot and cold, getting goose bumps, having myalgias and not sleeping. She denies suicide ideation and states she has mild depression because of her situation. Patient is here requesting detox and also states she wants to detox from methadone.  PCP none  Past Medical History  Diagnosis Date  . UTI (urinary tract infection)   . Sepsis   . Suicidal ideation   . Bipolar 1 disorder     bipolar  . Depression   . Anxiety   . PTSD (post-traumatic stress disorder)   . Drug abuse     Past Surgical History  Procedure Date  . Appendectomy     Family History  Problem Relation Age of Onset  . Cancer Mother     History  Substance Use Topics  . Smoking status: Current Everyday Smoker -- 1 packs/day  . Smokeless tobacco: Not on file  . Alcohol Use: Yes, rarely     occasionally  lives with mother unemployed  OB History    Grav Para Term Preterm Abortions TAB SAB Ect Mult Living                  Review of Systems  All other systems reviewed and are  negative.    Allergies  Review of patient's allergies indicates no known allergies.  Home Medications   Current Outpatient Rx  Name Route Sig Dispense Refill  . BUPRENORPHINE HCL-NALOXONE HCL 8-2 MG SL SUBL Sublingual Place 1.5 tablets under the tongue daily.    . ADULT MULTIVITAMIN W/MINERALS CH Oral Take 1 tablet by mouth daily.    Marland Kitchen NITROFURANTOIN MONOHYD MACRO 100 MG PO CAPS Oral Take 1 capsule (100 mg total) by mouth 2 (two) times daily. 10 capsule 0    BP 107/66  Pulse 102  Temp(Src) 99 F (37.2 C) (Oral)  Resp 16  Ht 5\' 3"  (1.6 m)  Wt 128 lb 9.6 oz (58.333 kg)  BMI 22.78 kg/m2  SpO2 98%  LMP 03/12/2012  Vital signs normal except tachycardia   Physical Exam  Nursing note and vitals reviewed. Constitutional: She is oriented to person, place, and time. She appears well-developed and well-nourished.  Non-toxic appearance. She does not appear ill. She appears distressed.       Patient looks like she feels bad.  HENT:  Head: Normocephalic and atraumatic.  Right Ear: External ear normal.  Left Ear: External ear normal.  Nose: Nose normal. No mucosal edema or rhinorrhea.  Mouth/Throat: Mucous membranes are normal. No dental abscesses  or uvula swelling.       Tongue dry  Eyes: Conjunctivae and EOM are normal. Pupils are equal, round, and reactive to light.  Neck: Normal range of motion and full passive range of motion without pain. Neck supple.  Cardiovascular: Normal rate, regular rhythm and normal heart sounds.  Exam reveals no gallop and no friction rub.   No murmur heard. Pulmonary/Chest: Effort normal and breath sounds normal. No respiratory distress. She has no wheezes. She has no rhonchi. She has no rales. She exhibits no tenderness and no crepitus.  Abdominal: Soft. Normal appearance and bowel sounds are normal. She exhibits no distension. There is no tenderness. There is no rebound and no guarding.  Musculoskeletal: Normal range of motion. She exhibits no edema  and no tenderness.       Moves all extremities well.   Neurological: She is alert and oriented to person, place, and time. She has normal strength. No cranial nerve deficit.  Skin: Skin is warm, dry and intact. No rash noted. No erythema. No pallor.       Patient has been injecting herself on her wrist on the volar aspect and radial aspect. There are some yellow bruising seen. The sites do not appear to be infected.  Psychiatric: Her speech is normal and behavior is normal. Her mood appears not anxious.       Flat affect    ED Course  Procedures (including critical care time)  09:24 Clydie Braun, ACT will evaluate patient.   15:40 Pt accepted at [facility]  Results for orders placed during the hospital encounter of 05/12/12  CBC      Component Value Range   WBC 9.4  4.0 - 10.5 (K/uL)   RBC 4.37  3.87 - 5.11 (MIL/uL)   Hemoglobin 13.3  12.0 - 15.0 (g/dL)   HCT 16.1  09.6 - 04.5 (%)   MCV 89.0  78.0 - 100.0 (fL)   MCH 30.4  26.0 - 34.0 (pg)   MCHC 34.2  30.0 - 36.0 (g/dL)   RDW 40.9  81.1 - 91.4 (%)   Platelets 240  150 - 400 (K/uL)  DIFFERENTIAL      Component Value Range   Neutrophils Relative 79 (*) 43 - 77 (%)   Neutro Abs 7.5  1.7 - 7.7 (K/uL)   Lymphocytes Relative 11 (*) 12 - 46 (%)   Lymphs Abs 1.0  0.7 - 4.0 (K/uL)   Monocytes Relative 9  3 - 12 (%)   Monocytes Absolute 0.8  0.1 - 1.0 (K/uL)   Eosinophils Relative 0  0 - 5 (%)   Eosinophils Absolute 0.0  0.0 - 0.7 (K/uL)   Basophils Relative 0  0 - 1 (%)   Basophils Absolute 0.0  0.0 - 0.1 (K/uL)  COMPREHENSIVE METABOLIC PANEL      Component Value Range   Sodium 136  135 - 145 (mEq/L)   Potassium 3.8  3.5 - 5.1 (mEq/L)   Chloride 103  96 - 112 (mEq/L)   CO2 22  19 - 32 (mEq/L)   Glucose, Bld 97  70 - 99 (mg/dL)   BUN 6  6 - 23 (mg/dL)   Creatinine, Ser 7.82  0.50 - 1.10 (mg/dL)   Calcium 9.5  8.4 - 95.6 (mg/dL)   Total Protein 7.7  6.0 - 8.3 (g/dL)   Albumin 3.7  3.5 - 5.2 (g/dL)   AST 16  0 - 37 (U/L)   ALT 10  0 - 35  (U/L)  Alkaline Phosphatase 53  39 - 117 (U/L)   Total Bilirubin 0.2 (*) 0.3 - 1.2 (mg/dL)   GFR calc non Af Amer >90  >90 (mL/min)   GFR calc Af Amer >90  >90 (mL/min)  ETHANOL      Component Value Range   Alcohol, Ethyl (B) <11  0 - 11 (mg/dL)  URINE RAPID DRUG SCREEN (HOSP PERFORMED)      Component Value Range   Opiates POSITIVE (*) NONE DETECTED    Cocaine NONE DETECTED  NONE DETECTED    Benzodiazepines POSITIVE (*) NONE DETECTED    Amphetamines NONE DETECTED  NONE DETECTED    Tetrahydrocannabinol NONE DETECTED  NONE DETECTED    Barbiturates NONE DETECTED  NONE DETECTED   URINALYSIS, ROUTINE W REFLEX MICROSCOPIC      Component Value Range   Color, Urine AMBER (*) YELLOW    APPearance TURBID (*) CLEAR    Specific Gravity, Urine 1.031 (*) 1.005 - 1.030    pH 6.0  5.0 - 8.0    Glucose, UA NEGATIVE  NEGATIVE (mg/dL)   Hgb urine dipstick LARGE (*) NEGATIVE    Bilirubin Urine SMALL (*) NEGATIVE    Ketones, ur TRACE (*) NEGATIVE (mg/dL)   Protein, ur 30 (*) NEGATIVE (mg/dL)   Urobilinogen, UA 0.2  0.0 - 1.0 (mg/dL)   Nitrite NEGATIVE  NEGATIVE    Leukocytes, UA SMALL (*) NEGATIVE   POCT PREGNANCY, URINE      Component Value Range   Preg Test, Ur NEGATIVE  NEGATIVE   URINE MICROSCOPIC-ADD ON      Component Value Range   Squamous Epithelial / LPF MANY (*) RARE    WBC, UA 3-6  <3 (WBC/hpf)   RBC / HPF 11-20  <3 (RBC/hpf)   Bacteria, UA MANY (*) RARE    Crystals CA OXALATE CRYSTALS (*) NEGATIVE    Urine-Other MUCOUS PRESENT     Laboratory interpretation all normal except contaminated urine sample     1. Heroin abuse   2. Narcotic addiction   3. Depression     Plan discharge to go to [facility] for admission for detox   Devoria Albe, MD, FACEP   MDM          Ward Givens, MD 05/12/12 [phone]

## 2012-05-12 NOTE — Discharge Instructions (Signed)
GO STRAIGHT TO [facility] NOW FOR ADMISSION

## 2012-05-12 NOTE — BH Assessment (Signed)
Assessment Note   Diane Foley is an 26 y.o. female. Pt is requesting detox from heroin. Pt denies SI, HI or psychosis. No delusion evident. Pt stated she had been on methadone maintenance for approximately 2 years and started tapering about 5-6 months ago. Pt stated it was gradual at first, but since dropping down to 20mg  it "was going too fast". Pt reports missing a few doses and then clinic was reducing her amount more. Pt relapsed on heroin 8 days ago using $60-100 daily. Pt stated she also attempted to change over from Methadone to Suboxone, but it was not working for her. Pt requesting [facility] placement if possible.  Axis I: Opioid Dependence Axis II: Deferred Axis III:  Past Medical History  Diagnosis Date  . UTI (urinary tract infection)   . Sepsis   . Suicidal ideation   . Bipolar 1 disorder     bipolar  . Depression   . Anxiety   . PTSD (post-traumatic stress disorder)   . Drug abuse    Axis IV: other psychosocial or environmental problems Axis V: 41-50 serious symptoms  Past Medical History:  Past Medical History  Diagnosis Date  . UTI (urinary tract infection)   . Sepsis   . Suicidal ideation   . Bipolar 1 disorder     bipolar  . Depression   . Anxiety   . PTSD (post-traumatic stress disorder)   . Drug abuse     Past Surgical History  Procedure Date  . Appendectomy     Family History:  Family History  Problem Relation Age of Onset  . Cancer Mother     Social History:  reports that she has been smoking.  She does not have any smokeless tobacco history on file. She reports that she drinks alcohol. She reports that she uses illicit drugs.  Additional Social History:  Alcohol / Drug Use Pain Medications: N/A Prescriptions: See PTA Listing Over the Counter: N/A History of alcohol / drug use?: Yes Longest period of sobriety (when/how long): 63 days - 4 yrs ago Negative Consequences of Use: Financial;Personal relationships Withdrawal Symptoms: Fever /  Chills;Weakness Substance #1 Name of Substance 1: Opiates/heroin/Methadone 1 - Age of First Use: 17 1 - Amount (size/oz): $60-100 1 - Frequency: daily 1 - Duration: 8 days since returning to heroin 1 - Last Use / Amount: 05/11/12 approx 3pm  CIWA: CIWA-Ar BP: 106/65 mmHg Pulse Rate: 68  Nausea and Vomiting: mild nausea with no vomiting Tactile Disturbances: very mild itching, pins and needles, burning or numbness Tremor: no tremor Auditory Disturbances: very mild harshness or ability to frighten Paroxysmal Sweats: barely perceptible sweating, palms moist Visual Disturbances: very mild sensitivity Anxiety: mildly anxious Headache, Fullness in Head: very mild Agitation: somewhat more than normal activity Orientation and Clouding of Sensorium: oriented and can do serial additions CIWA-Ar Total: 8  COWS: Clinical Opiate Withdrawal Scale (COWS) Resting Pulse Rate: Pulse Rate 80 or below Sweating: Subjective report of chills or flushing Restlessness: Frequent shifting or extraneous movements of legs/arms Pupil Size: Pupils possibly larger than normal for room light Bone or Joint Aches: Mild diffuse discomfort Runny Nose or Tearing: Not present GI Upset: nausea or loose stool Tremor: Tremor can be felt, but not observed Yawning: Yawning once or twice during assessment Anxiety or Irritability: Patient reports increasing irritability or anxiousness Gooseflesh Skin: Skin is smooth COWS Total Score: 11   Allergies: No Known Allergies  Home Medications:  (Not in a hospital admission)  OB/GYN Status:  Patient's  last menstrual period was 03/12/2012.  General Assessment Data Location of Assessment: [facility] Living Arrangements: Parent (Lives w/ mom) Can pt return to current living arrangement?: Yes Admission Status: Voluntary Is patient capable of signing voluntary admission?: Yes Transfer from: Acute Hospital Referral Source: Self/Family/Friend  Education Status Is patient  currently in school?: No  Risk to self Suicidal Ideation: No-Not Currently/Within Last 6 Months Suicidal Intent: No-Not Currently/Within Last 6 Months Is patient at risk for suicide?: No Suicidal Plan?: No-Not Currently/Within Last 6 Months Access to Means: No What has been your use of drugs/alcohol within the last 12 months?:  (daily use of heroin past 8 days; Methadone maintenance/taper) Previous Attempts/Gestures: Yes How many times?: 1  Other Self Harm Risks: N/A Triggers for Past Attempts: Other (Comment) (Chronic SA) Intentional Self Injurious Behavior: None Family Suicide History: No Recent stressful life event(s): Other (Comment) (Missed a few doses of the methadone & decrease in doseage) Persecutory voices/beliefs?: No Depression: No Substance abuse history and/or treatment for substance abuse?: Yes Suicide prevention information given to non-admitted patients: Not applicable  Risk to Others Homicidal Ideation: No Thoughts of Harm to Others: No Current Homicidal Intent: No Current Homicidal Plan: No Access to Homicidal Means: No Identified Victim: N/A History of harm to others?: No Assessment of Violence: None Noted Violent Behavior Description: Calm, coopperative Does patient have access to weapons?: No Criminal Charges Pending?: No Does patient have a court date: No  Psychosis Hallucinations: None noted Delusions: None noted  Mental Status Report Appear/Hygiene: Disheveled Eye Contact: Good Motor Activity: Restlessness;Freedom of movement Speech: Soft Level of Consciousness: Alert;Restless;Quiet/awake Mood: Anxious Affect: Appropriate to circumstance Anxiety Level: Minimal Thought Processes: Coherent;Relevant Judgement: Unimpaired Orientation: Person;Place;Time;Situation Obsessive Compulsive Thoughts/Behaviors: None  Cognitive Functioning Concentration: Normal Memory: Recent Intact;Remote Intact IQ: Average Insight: Good Impulse Control:  Fair Appetite: Poor Weight Loss: 15  Weight Gain: 0  Sleep: No Change Total Hours of Sleep: 5  Vegetative Symptoms: None  ADLScreening [facility] Assessment Services) Patient's cognitive ability adequate to safely complete daily activities?: Yes Patient able to express need for assistance with ADLs?: Yes Independently performs ADLs?: Yes  Abuse/Neglect [facility]) Physical Abuse: Denies Verbal Abuse: Denies Sexual Abuse: Denies  Prior Inpatient Therapy Prior Inpatient Therapy: Yes Prior Therapy Dates: 2013 Prior Therapy Facilty/Provider(s): [facility]; [facility] (in past) Reason for Treatment: SA, SI  Prior Outpatient Therapy Prior Outpatient Therapy: No  ADL Screening (condition at time of admission) Patient's cognitive ability adequate to safely complete daily activities?: Yes Patient able to express need for assistance with ADLs?: Yes Independently performs ADLs?: Yes Weakness of Legs: None Weakness of Arms/Hands: None  Home Assistive Devices/Equipment Home Assistive Devices/Equipment: None    Abuse/Neglect Assessment (Assessment to be complete while patient is alone) Physical Abuse: Denies Verbal Abuse: Denies Sexual Abuse: Denies Exploitation of patient/patient's resources: Denies Values / Beliefs Cultural Requests During Hospitalization: None Spiritual Requests During Hospitalization: None   Advance Directives (For Healthcare) Advance Directive: Patient does not have advance directive;Patient would not like information Pre-existing out of facility DNR order (yellow form or pink MOST form): No Nutrition Screen Problems chewing or swallowing foods and/or liquids: No Home Tube Feeding or Total Parenteral Nutrition (TPN): No Patient appears severely malnourished: No Pregnant or Lactating: No  Additional Information 1:1 In Past 12 Months?: No CIRT Risk: No Elopement Risk: No Does patient have medical clearance?: Yes     Disposition:  Disposition Disposition of Patient:  Inpatient treatment program;Referred to ([facility], [facility], [facility]) Patient referred to: [facility]  On Site  Evaluation by:   Reviewed with Physician:     Romeo Apple 05/12/2012 1:59 PM

## 2012-05-12 NOTE — ED Notes (Signed)
Patient reports that she has been going to the methadone clinic and was doing well until she felt like her dosage ws being dropped too fast and this occurred 9 days ago. Patient was placed on suboxone after the methadone. Patient states she has been using opiates and heroin daily and is seeking help with drug abuse.

## 2013-02-09 ENCOUNTER — Encounter (HOSPITAL_COMMUNITY): Payer: Self-pay

## 2013-02-09 ENCOUNTER — Emergency Department (HOSPITAL_COMMUNITY)
Admission: EM | Admit: 2013-02-09 | Discharge: 2013-02-09 | Disposition: A | Discharge status: Home / Self Care | Payer: Self-pay | Attending: Emergency Medicine | Admitting: Emergency Medicine

## 2013-02-09 DIAGNOSIS — R300 Dysuria: Secondary | ICD-10-CM | POA: Insufficient documentation

## 2013-02-09 DIAGNOSIS — F17200 Nicotine dependence, unspecified, uncomplicated: Secondary | ICD-10-CM | POA: Insufficient documentation

## 2013-02-09 DIAGNOSIS — N390 Urinary tract infection, site not specified: Secondary | ICD-10-CM | POA: Insufficient documentation

## 2013-02-09 DIAGNOSIS — Z8619 Personal history of other infectious and parasitic diseases: Secondary | ICD-10-CM | POA: Insufficient documentation

## 2013-02-09 DIAGNOSIS — Z87440 Personal history of urinary (tract) infections: Secondary | ICD-10-CM | POA: Insufficient documentation

## 2013-02-09 DIAGNOSIS — Z79899 Other long term (current) drug therapy: Secondary | ICD-10-CM | POA: Insufficient documentation

## 2013-02-09 DIAGNOSIS — M545 Low back pain, unspecified: Secondary | ICD-10-CM | POA: Insufficient documentation

## 2013-02-09 DIAGNOSIS — Z8659 Personal history of other mental and behavioral disorders: Secondary | ICD-10-CM | POA: Insufficient documentation

## 2013-02-09 DIAGNOSIS — Z9089 Acquired absence of other organs: Secondary | ICD-10-CM | POA: Insufficient documentation

## 2013-02-09 DIAGNOSIS — Z3202 Encounter for pregnancy test, result negative: Secondary | ICD-10-CM | POA: Insufficient documentation

## 2013-02-09 DIAGNOSIS — N72 Inflammatory disease of cervix uteri: Secondary | ICD-10-CM | POA: Insufficient documentation

## 2013-02-09 LAB — URINE MICROSCOPIC-ADD ON

## 2013-02-09 LAB — WET PREP, GENITAL
Clue Cells Wet Prep HPF POC: NONE SEEN
Trich, Wet Prep: NONE SEEN

## 2013-02-09 LAB — URINALYSIS, ROUTINE W REFLEX MICROSCOPIC
Glucose, UA: NEGATIVE mg/dL
pH: 5.5 (ref 5.0–8.0)

## 2013-02-09 LAB — GC/CHLAMYDIA PROBE AMP: GC Probe RNA: NEGATIVE

## 2013-02-09 MED ORDER — AZITHROMYCIN 250 MG PO TABS
1000.0000 mg | ORAL_TABLET | Freq: Once | ORAL | Status: AC
  Administered 2013-02-09: 1000 mg via ORAL
  Filled 2013-02-09: qty 4

## 2013-02-09 MED ORDER — CEFTRIAXONE SODIUM 250 MG IJ SOLR
250.0000 mg | Freq: Once | INTRAMUSCULAR | Status: AC
  Administered 2013-02-09: 250 mg via INTRAMUSCULAR
  Filled 2013-02-09: qty 250

## 2013-02-09 MED ORDER — PHENAZOPYRIDINE HCL 200 MG PO TABS: 200.0000 mg | ORAL_TABLET | Freq: Three times a day (TID) | ORAL | Status: DC | PRN

## 2013-02-09 MED ORDER — SULFAMETHOXAZOLE-TRIMETHOPRIM 800-160 MG PO TABS: 1.0000 | ORAL_TABLET | Freq: Two times a day (BID) | ORAL | Status: DC

## 2013-02-09 NOTE — ED Notes (Signed)
Pt states that she has been bleeding for over 2 weeks and that her low back hurts,  States that she goes to Methadone clinic and was explaining her symptoms to the nurse there and she thought she might have a sexually transmitted disease since she had unprotected sex

## 2013-02-09 NOTE — ED Provider Notes (Signed)
History     CSN: [id number]  Arrival date & time 02/09/13  1191   First MD Initiated Contact with Patient 02/09/13 [phone]      Chief Complaint  Patient presents with  . Back Pain  . Vaginal Bleeding    (Consider location/radiation/quality/duration/timing/severity/associated sxs/prior treatment) HPI Comments: Patient presents to the ER for evaluation of lower back pain radiating around to the groin bilaterally with vaginal bleeding. Symptoms have been present for 2 weeks. Patient reports that the back pain is mild, a tingling type sensation which she cannot clearly define. She has started to have dysuria in the last one or 2 days. She reports that she is concerned that she might have chlamydia or gonorrhea. She has not had any fever. There is no nausea, vomiting or diarrhea.  Patient is a 27 y.o. female presenting with back pain and vaginal bleeding.  Back Pain Associated symptoms: dysuria   Vaginal Bleeding    Past Medical History  Diagnosis Date  . UTI (urinary tract infection)   . Sepsis(995.91)   . Suicidal ideation   . Bipolar 1 disorder     bipolar  . Depression   . Anxiety   . PTSD (post-traumatic stress disorder)   . Drug abuse     Past Surgical History  Procedure Laterality Date  . Appendectomy      Family History  Problem Relation Age of Onset  . Cancer Mother     History  Substance Use Topics  . Smoking status: Current Every Day Smoker -- 0.33 packs/day  . Smokeless tobacco: Not on file  . Alcohol Use: Yes     Comment: occasionally    OB History   Grav Para Term Preterm Abortions TAB SAB Ect Mult Living                  Review of Systems  Genitourinary: Positive for dysuria and vaginal bleeding.  Musculoskeletal: Positive for back pain.  All other systems reviewed and are negative.    Allergies  Review of patient's allergies indicates no known allergies.  Home Medications   Current Outpatient Rx  Name  Route  Sig  Dispense  Refill  .  methadone (DOLOPHINE) 10 MG/ML solution   Oral   Take 40 mg by mouth daily. Scheduled. Patient stated that she goes to the methadone pain clinic.           BP 123/89  Pulse 97  Temp(Src) 99.2 F (37.3 C) (Oral)  Resp 18  Ht 5\' 3"  (1.6 m)  Wt 125 lb (56.7 kg)  BMI 22.15 kg/m2  SpO2 100%  LMP 02/02/2013  Physical Exam  Constitutional: She is oriented to person, place, and time. She appears well-developed and well-nourished. No distress.  HENT:  Head: Normocephalic and atraumatic.  Right Ear: Hearing normal.  Nose: Nose normal.  Mouth/Throat: Oropharynx is clear and moist and mucous membranes are normal.  Eyes: Conjunctivae and EOM are normal. Pupils are equal, round, and reactive to light.  Neck: Normal range of motion. Neck supple.  Cardiovascular: Normal rate, regular rhythm, S1 normal and S2 normal.  Exam reveals no gallop and no friction rub.   No murmur heard. Pulmonary/Chest: Effort normal and breath sounds normal. No respiratory distress. She exhibits no tenderness.  Abdominal: Soft. Normal appearance and bowel sounds are normal. There is no hepatosplenomegaly. There is no tenderness. There is no rebound, no guarding, no tenderness at McBurney's point and negative Murphy's sign. No hernia.  Genitourinary: Vagina normal and  uterus normal. Cervix exhibits motion tenderness. Right adnexum displays no mass and no tenderness. Left adnexum displays no mass and no tenderness.  Musculoskeletal: Normal range of motion.  Neurological: She is alert and oriented to person, place, and time. She has normal strength. No cranial nerve deficit or sensory deficit. Coordination normal. GCS eye subscore is 4. GCS verbal subscore is 5. GCS motor subscore is 6.  Skin: Skin is warm, dry and intact. No rash noted. No cyanosis.  Psychiatric: She has a normal mood and affect. Her speech is normal and behavior is normal. Thought content normal.    ED Course  Procedures (including critical care  time)  Labs Reviewed  WET PREP, GENITAL - Abnormal; Notable for the following:    WBC, Wet Prep HPF POC FEW (*)    All other components within normal limits  URINALYSIS, ROUTINE W REFLEX MICROSCOPIC - Abnormal; Notable for the following:    Color, Urine AMBER (*)    APPearance CLOUDY (*)    Hgb urine dipstick LARGE (*)    Ketones, ur TRACE (*)    Protein, ur 30 (*)    Nitrite POSITIVE (*)    Leukocytes, UA MODERATE (*)    All other components within normal limits  URINE MICROSCOPIC-ADD ON - Abnormal; Notable for the following:    Bacteria, UA MANY (*)    Crystals CA OXALATE CRYSTALS (*)    All other components within normal limits  GC/CHLAMYDIA PROBE AMP  URINE CULTURE  PREGNANCY, URINE   No results found.   Diagnosis: UTI; Cervicitis    MDM  Patient presents to the ER for evaluation of ill-defined lower back discomfort and vaginal bleeding for 2 weeks. Patient has now started to have symptoms of dysuria as well. Pelvic examination reveals small amount of bleeding for a closed cervical os. There is moderate cervical motion tenderness without any purulence. Adnexa were normal. Patient concerned about possible STD exposure. Will need to cover for cervicitis.  Patient's urinalysis shows clear evidence of infection. This is likely the cause of the patient's symptoms. Will be treated with Bactrim in addition to the acute medications provided in the ER.        Gilda Crease, MD 02/09/13 [phone]

## 2013-02-09 NOTE — ED Notes (Signed)
Pt complains of lower back and severe vaginal bleeding for 2 weeks

## 2013-02-11 LAB — URINE CULTURE

## 2013-02-12 ENCOUNTER — Telehealth (HOSPITAL_COMMUNITY): Payer: Self-pay | Admitting: Emergency Medicine

## 2013-02-12 NOTE — ED Notes (Signed)
Patient has +Urine culture. Checking to see if treated appropriately. °

## 2013-02-12 NOTE — ED Notes (Signed)
+  Urine. Patient given Septra. Resistant. Chart sent to EDP office for review.

## 2013-02-14 NOTE — ED Notes (Signed)
Chart returned from EDP office -rx for Keflex 500 mg 1 QID x 5 days #20 per Dr Juleen China.

## 2013-02-19 ENCOUNTER — Telehealth (HOSPITAL_COMMUNITY): Payer: Self-pay | Admitting: Emergency Medicine

## 2013-02-20 ENCOUNTER — Telehealth (HOSPITAL_COMMUNITY): Payer: Self-pay | Admitting: *Deleted

## 2013-06-13 ENCOUNTER — Encounter (HOSPITAL_COMMUNITY): Payer: Self-pay | Admitting: Adult Health

## 2013-06-13 ENCOUNTER — Emergency Department (HOSPITAL_COMMUNITY)
Admission: EM | Admit: 2013-06-13 | Discharge: 2013-06-13 | Disposition: A | Discharge status: Home Health | Payer: Self-pay | Attending: Emergency Medicine | Admitting: Emergency Medicine

## 2013-06-13 DIAGNOSIS — Z8659 Personal history of other mental and behavioral disorders: Secondary | ICD-10-CM | POA: Insufficient documentation

## 2013-06-13 DIAGNOSIS — Z3202 Encounter for pregnancy test, result negative: Secondary | ICD-10-CM | POA: Insufficient documentation

## 2013-06-13 DIAGNOSIS — R21 Rash and other nonspecific skin eruption: Secondary | ICD-10-CM | POA: Insufficient documentation

## 2013-06-13 DIAGNOSIS — N390 Urinary tract infection, site not specified: Secondary | ICD-10-CM | POA: Insufficient documentation

## 2013-06-13 DIAGNOSIS — F17200 Nicotine dependence, unspecified, uncomplicated: Secondary | ICD-10-CM | POA: Insufficient documentation

## 2013-06-13 DIAGNOSIS — F1911 Other psychoactive substance abuse, in remission: Secondary | ICD-10-CM | POA: Insufficient documentation

## 2013-06-13 LAB — URINALYSIS, ROUTINE W REFLEX MICROSCOPIC
Hgb urine dipstick: NEGATIVE
Specific Gravity, Urine: 1.017 (ref 1.005–1.030)
pH: 5 (ref 5.0–8.0)

## 2013-06-13 LAB — URINE MICROSCOPIC-ADD ON

## 2013-06-13 MED ORDER — CEFIXIME 400 MG PO TABS: 400.0000 mg | ORAL_TABLET | Freq: Every day | ORAL | Status: DC

## 2013-06-13 MED ORDER — CEFTRIAXONE SODIUM 1 G IJ SOLR
1.0000 g | Freq: Once | INTRAMUSCULAR | Status: AC
  Administered 2013-06-13: 1 g via INTRAMUSCULAR
  Filled 2013-06-13: qty 10

## 2013-06-13 MED ORDER — LIDOCAINE HCL (PF) 1 % IJ SOLN
INTRAMUSCULAR | Status: AC
  Administered 2013-06-13: 5 mL
  Filled 2013-06-13: qty 5

## 2013-06-13 NOTE — ED Provider Notes (Signed)
History    CSN: [id number] Arrival date & time 06/13/13  1940  First MD Initiated Contact with Patient 06/13/13 2044     Chief Complaint  Patient presents with  . Urinary Tract Infection  . Rash   HPI  History provided by the patient. 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 Symptoms feel similar to previous UTIs. She has not used any treatment for her symptoms. She denies any associated abdominal pain or flank pain. No vaginal bleeding or discharge. Denies any visual changes. No fever, chills or sweats. She has not used any treatments for her symptoms. Denies any other aggravating or alleviating factors. Patient also a second complaint of slightly pruritic rash to her inner thighs and lower abdomen for the past one week. She has been using hydrocortisone cream and states rash has been improving. She is unsure but did change laundry detergent recently. Denies rash on any other parts of body. Denies any other new soaps or environmental exposures. Denies any painful lesions.    Past Medical History  Diagnosis Date  . UTI (urinary tract infection)   . Sepsis(995.91)   . Suicidal ideation   . Bipolar 1 disorder     bipolar  . Depression   . Anxiety   . PTSD (post-traumatic stress disorder)   . Drug abuse    Past Surgical History  Procedure Laterality Date  . Appendectomy     Family History  Problem Relation Age of Onset  . Cancer Mother    History  Substance Use Topics  . Smoking status: Current Every Day Smoker -- 0.33 packs/day  . Smokeless tobacco: Not on file  . Alcohol Use: Yes     Comment: occasionally   OB History   Grav Para Term Preterm Abortions TAB SAB Ect Mult Living                 Review of Systems  Constitutional: Negative for fever, chills and diaphoresis.   Gastrointestinal: Negative for nausea, vomiting and abdominal pain.  Genitourinary: Positive for dysuria and frequency. Negative for hematuria, flank pain, vaginal bleeding, vaginal discharge, genital sores and menstrual problem.  All other systems reviewed and are negative.    Allergies  Review of patient's allergies indicates no known allergies.  Home Medications   Current Outpatient Rx  Name  Route  Sig  Dispense  Refill  . hydrocortisone cream 1 %   Topical   Apply 1 application topically daily as needed (for rash).         . methadone (DOLOPHINE) 10 MG/ML solution   Oral   Take 25 mg by mouth daily. Scheduled. Patient stated that she goes to the methadone pain clinic.         Marland Kitchen Phenazopyridine HCl (AZO DINE MAXIMUM STRENGTH) 97.5 MG TABS   Oral   Take 2 tablets by mouth 2 (two) times daily.         Marland Kitchen VITAMIN C, CALCIUM ASCORBATE, PO   Oral   Take 1 packet by mouth daily. Emergen-C          BP 154/84  Pulse 88  Temp(Src) 98.8 F (37.1 C) (Oral)  Resp 16  Ht 5\' 3"  (1.6 m)  Wt 127 lb (57.607 kg)  BMI 22.5 kg/m2  SpO2 99%  LMP 05/26/2013 Physical Exam  Nursing  note and vitals reviewed. Constitutional: She is oriented to person, place, and time. She appears well-developed and well-nourished. No distress.  HENT:  Head: Normocephalic.  Cardiovascular: Normal rate and regular rhythm.   Pulmonary/Chest: Effort normal and breath sounds normal.  Abdominal: Soft. There is no tenderness. There is no rebound and no guarding.  Musculoskeletal: Normal range of motion.  Neurological: She is alert and oriented to person, place, and time.  Skin: Skin is warm and dry.  There are a few areas to the inner thighs and lower abdomen of ulcerated skin consistent with this excoriations. No vesicular or papular lesions.  Psychiatric: She has a normal mood and affect. Her behavior is normal.    ED Course  Procedures     Results for orders placed during the hospital  encounter of 06/13/13  URINALYSIS, ROUTINE W REFLEX MICROSCOPIC      Result Value Range   Color, Urine ORANGE (*) YELLOW   APPearance CLOUDY (*) CLEAR   Specific Gravity, Urine 1.017  1.005 - 1.030   pH 5.0  5.0 - 8.0   Glucose, UA NEGATIVE  NEGATIVE mg/dL   Hgb urine dipstick NEGATIVE  NEGATIVE   Bilirubin Urine MODERATE (*) NEGATIVE   Ketones, ur 15 (*) NEGATIVE mg/dL   Protein, ur NEGATIVE  NEGATIVE mg/dL   Urobilinogen, UA 4.0 (*) 0.0 - 1.0 mg/dL   Nitrite POSITIVE (*) NEGATIVE   Leukocytes, UA LARGE (*) NEGATIVE  URINE MICROSCOPIC-ADD ON      Result Value Range   Squamous Epithelial / LPF RARE  RARE   WBC, UA 21-50  <3 WBC/hpf   Bacteria, UA MANY (*) RARE   Casts HYALINE CASTS (*) NEGATIVE   Urine-Other MUCOUS PRESENT    POCT PREGNANCY, URINE      Result Value Range   Preg Test, Ur NEGATIVE  NEGATIVE         1. UTI (lower urinary tract infection)      MDM  8:50 PM patient seen and evaluated. Patient appears well no acute distress. She does not appear severely ill or toxic.   Review of past medical charts and lab testing shows patient has had frequent UTIs. A culture from last March showed Escherichia coli resistant to many antibiotics. There was good sensitivity to ceftriaxone cefazolin and some sensitivity to Macrobid. Resistant against penicillins, Cipro, Levaquin and Bactrim.  Patient given IM dose of ceftriaxone and prescription for Suprax.  Angus Seller, PA-C 06/13/13 2153

## 2013-06-13 NOTE — ED Notes (Signed)
Presents with one week of rash to right inner thigh and lower abdomen described as itching, not painful. Pt also reports, "I have another UTI. I get them a lot. I have pain when I urinate." denies abdominal pain, denies vaginal discharge, denies vaginal odor.

## 2013-06-13 NOTE — ED Provider Notes (Signed)
Medical screening examination/treatment/procedure(s) were performed by non-physician practitioner and as supervising physician I was immediately available for consultation/collaboration.  Doug Sou, MD 06/13/13 [phone]

## 2013-06-15 LAB — URINE CULTURE

## 2013-06-17 NOTE — ED Notes (Signed)
+   Urine Patient treated with Cefixime-sensitive to same-chart appended per protocol MD. 

## 2014-04-03 ENCOUNTER — Emergency Department: Payer: Self-pay | Admitting: Emergency Medicine

## 2014-04-03 LAB — URINALYSIS, COMPLETE
BILIRUBIN, UR: NEGATIVE
BLOOD: NEGATIVE
Bacteria: NONE SEEN
Glucose,UR: NEGATIVE mg/dL (ref 0–75)
Ketone: NEGATIVE
NITRITE: NEGATIVE
PH: 6 (ref 4.5–8.0)
Protein: NEGATIVE
RBC,UR: 1 /HPF (ref 0–5)
Specific Gravity: 1.014 (ref 1.003–1.030)
Squamous Epithelial: 2
WBC UR: 1 /HPF (ref 0–5)

## 2014-04-03 LAB — CBC
HCT: 44.2 % (ref 35.0–47.0)
HGB: 14.8 g/dL (ref 12.0–16.0)
MCH: 31.8 pg (ref 26.0–34.0)
MCHC: 33.4 g/dL (ref 32.0–36.0)
MCV: 95 fL (ref 80–100)
Platelet: 153 10*3/uL (ref 150–440)
RBC: 4.65 10*6/uL (ref 3.80–5.20)
RDW: 13.1 % (ref 11.5–14.5)
WBC: 3.8 10*3/uL (ref 3.6–11.0)

## 2014-04-03 LAB — BASIC METABOLIC PANEL
ANION GAP: 5 — AB (ref 7–16)
BUN: 8 mg/dL (ref 7–18)
CALCIUM: 9.1 mg/dL (ref 8.5–10.1)
CHLORIDE: 107 mmol/L (ref 98–107)
CO2: 28 mmol/L (ref 21–32)
Creatinine: 0.7 mg/dL (ref 0.60–1.30)
EGFR (African American): >60
EGFR (Non-African Amer.): >60
GLUCOSE: 91 mg/dL (ref 65–99)
Osmolality: 277 (ref 275–301)
POTASSIUM: 4.2 mmol/L (ref 3.5–5.1)
Sodium: 140 mmol/L (ref 136–145)

## 2014-04-03 LAB — TROPONIN I

## 2015-02-09 ENCOUNTER — Inpatient Hospital Stay (HOSPITAL_COMMUNITY)
Admission: AD | Admit: 2015-02-09 | Discharge: 2015-02-16 | DRG: 885 | Disposition: A | Discharge status: Home / Self Care | Payer: Federal, State, Local not specified - Other | Source: Intra-hospital | Attending: Psychiatry | Admitting: Psychiatry

## 2015-02-09 ENCOUNTER — Encounter (HOSPITAL_COMMUNITY): Payer: Self-pay

## 2015-02-09 ENCOUNTER — Emergency Department (HOSPITAL_COMMUNITY)
Admission: EM | Admit: 2015-02-09 | Discharge: 2015-02-09 | Disposition: A | Discharge status: Home / Self Care | Payer: Federal, State, Local not specified - Other | Attending: Emergency Medicine | Admitting: Emergency Medicine

## 2015-02-09 ENCOUNTER — Encounter (HOSPITAL_COMMUNITY): Payer: Self-pay | Admitting: *Deleted

## 2015-02-09 DIAGNOSIS — F39 Unspecified mood [affective] disorder: Secondary | ICD-10-CM | POA: Diagnosis present

## 2015-02-09 DIAGNOSIS — F332 Major depressive disorder, recurrent severe without psychotic features: Secondary | ICD-10-CM | POA: Diagnosis present

## 2015-02-09 DIAGNOSIS — F1120 Opioid dependence, uncomplicated: Secondary | ICD-10-CM | POA: Diagnosis present

## 2015-02-09 DIAGNOSIS — F1510 Other stimulant abuse, uncomplicated: Secondary | ICD-10-CM | POA: Insufficient documentation

## 2015-02-09 DIAGNOSIS — B1920 Unspecified viral hepatitis C without hepatic coma: Secondary | ICD-10-CM | POA: Diagnosis present

## 2015-02-09 DIAGNOSIS — R45851 Suicidal ideations: Secondary | ICD-10-CM | POA: Diagnosis present

## 2015-02-09 DIAGNOSIS — F323 Major depressive disorder, single episode, severe with psychotic features: Secondary | ICD-10-CM

## 2015-02-09 DIAGNOSIS — F322 Major depressive disorder, single episode, severe without psychotic features: Secondary | ICD-10-CM | POA: Diagnosis present

## 2015-02-09 DIAGNOSIS — F17210 Nicotine dependence, cigarettes, uncomplicated: Secondary | ICD-10-CM | POA: Diagnosis present

## 2015-02-09 DIAGNOSIS — F319 Bipolar disorder, unspecified: Secondary | ICD-10-CM | POA: Insufficient documentation

## 2015-02-09 DIAGNOSIS — F1110 Opioid abuse, uncomplicated: Secondary | ICD-10-CM | POA: Insufficient documentation

## 2015-02-09 DIAGNOSIS — Z8619 Personal history of other infectious and parasitic diseases: Secondary | ICD-10-CM | POA: Insufficient documentation

## 2015-02-09 DIAGNOSIS — Z720 Tobacco use: Secondary | ICD-10-CM | POA: Insufficient documentation

## 2015-02-09 DIAGNOSIS — F419 Anxiety disorder, unspecified: Secondary | ICD-10-CM | POA: Insufficient documentation

## 2015-02-09 DIAGNOSIS — Z87440 Personal history of urinary (tract) infections: Secondary | ICD-10-CM | POA: Insufficient documentation

## 2015-02-09 DIAGNOSIS — Z79891 Long term (current) use of opiate analgesic: Secondary | ICD-10-CM | POA: Insufficient documentation

## 2015-02-09 DIAGNOSIS — F329 Major depressive disorder, single episode, unspecified: Secondary | ICD-10-CM | POA: Diagnosis present

## 2015-02-09 LAB — COMPREHENSIVE METABOLIC PANEL
ALBUMIN: 4.1 g/dL (ref 3.5–5.2)
ALT: 26 U/L (ref 0–35)
ANION GAP: 7 (ref 5–15)
AST: 32 U/L (ref 0–37)
Alkaline Phosphatase: 41 U/L (ref 39–117)
BILIRUBIN TOTAL: 0.5 mg/dL (ref 0.3–1.2)
CHLORIDE: 107 mmol/L (ref 96–112)
CO2: 25 mmol/L (ref 19–32)
CREATININE: 0.63 mg/dL (ref 0.50–1.10)
Calcium: 8.9 mg/dL (ref 8.4–10.5)
GLUCOSE: 106 mg/dL — AB (ref 70–99)
Potassium: 3.7 mmol/L (ref 3.5–5.1)
Sodium: 139 mmol/L (ref 135–145)
Total Protein: 7.1 g/dL (ref 6.0–8.3)

## 2015-02-09 LAB — CBC WITH DIFFERENTIAL/PLATELET
BASOS ABS: 0 10*3/uL (ref 0.0–0.1)
Basophils Relative: 0 % (ref 0–1)
EOS ABS: 0.1 10*3/uL (ref 0.0–0.7)
EOS PCT: 2 % (ref 0–5)
HEMATOCRIT: 39.2 % (ref 36.0–46.0)
HEMOGLOBIN: 13.1 g/dL (ref 12.0–15.0)
Lymphocytes Relative: 30 % (ref 12–46)
Lymphs Abs: 1.8 10*3/uL (ref 0.7–4.0)
MCH: 30.3 pg (ref 26.0–34.0)
MCHC: 33.4 g/dL (ref 30.0–36.0)
MCV: 90.7 fL (ref 78.0–100.0)
Monocytes Absolute: 0.3 10*3/uL (ref 0.1–1.0)
Monocytes Relative: 6 % (ref 3–12)
Neutro Abs: 3.8 10*3/uL (ref 1.7–7.7)
Neutrophils Relative %: 62 % (ref 43–77)
PLATELETS: 198 10*3/uL (ref 150–400)
RBC: 4.32 MIL/uL (ref 3.87–5.11)
RDW: 13 % (ref 11.5–15.5)
WBC: 6 10*3/uL (ref 4.0–10.5)

## 2015-02-09 LAB — RAPID URINE DRUG SCREEN, HOSP PERFORMED
Amphetamines: POSITIVE — AB
BARBITURATES: NOT DETECTED
BENZODIAZEPINES: NOT DETECTED
COCAINE: NOT DETECTED
Opiates: POSITIVE — AB
TETRAHYDROCANNABINOL: NOT DETECTED

## 2015-02-09 LAB — ETHANOL: Alcohol, Ethyl (B): <5 mg/dL (ref 0–9)

## 2015-02-09 MED ORDER — ACETAMINOPHEN 325 MG PO TABS: 650.0000 mg | ORAL_TABLET | ORAL | Status: DC | PRN

## 2015-02-09 MED ORDER — ALUM & MAG HYDROXIDE-SIMETH 200-200-20 MG/5ML PO SUSP: 30.0000 mL | ORAL | Status: DC | PRN

## 2015-02-09 MED ORDER — IBUPROFEN 600 MG PO TABS: 600.0000 mg | ORAL_TABLET | Freq: Three times a day (TID) | ORAL | Status: DC | PRN

## 2015-02-09 MED ORDER — IBUPROFEN 200 MG PO TABS: 600.0000 mg | ORAL_TABLET | Freq: Three times a day (TID) | ORAL | Status: DC | PRN

## 2015-02-09 MED ORDER — NICOTINE 21 MG/24HR TD PT24: 21.0000 mg | MEDICATED_PATCH | Freq: Every day | TRANSDERMAL | Status: DC

## 2015-02-09 MED ORDER — ONDANSETRON HCL 4 MG PO TABS
4.0000 mg | ORAL_TABLET | Freq: Three times a day (TID) | ORAL | Status: DC | PRN
Start: 2015-02-09 — End: 2015-02-09

## 2015-02-09 MED ORDER — ZOLPIDEM TARTRATE 5 MG PO TABS: 5.0000 mg | ORAL_TABLET | Freq: Every evening | ORAL | Status: DC | PRN

## 2015-02-09 MED ORDER — ZOLPIDEM TARTRATE 5 MG PO TABS
5.0000 mg | ORAL_TABLET | Freq: Every evening | ORAL | Status: DC | PRN
  Administered 2015-02-09: 5 mg via ORAL
  Filled 2015-02-09: qty 1

## 2015-02-09 MED ORDER — NICOTINE 21 MG/24HR TD PT24
21.0000 mg | MEDICATED_PATCH | Freq: Every day | TRANSDERMAL | Status: DC
  Filled 2015-02-09 (×9): qty 1

## 2015-02-09 MED ORDER — LORAZEPAM 1 MG PO TABS
1.0000 mg | ORAL_TABLET | Freq: Three times a day (TID) | ORAL | Status: DC | PRN
  Administered 2015-02-09: 1 mg via ORAL
  Filled 2015-02-09: qty 1

## 2015-02-09 MED ORDER — LORAZEPAM 1 MG PO TABS: 1.0000 mg | ORAL_TABLET | Freq: Three times a day (TID) | ORAL | Status: DC | PRN

## 2015-02-09 MED ORDER — ONDANSETRON HCL 4 MG PO TABS: 4.0000 mg | ORAL_TABLET | Freq: Three times a day (TID) | ORAL | Status: DC | PRN

## 2015-02-09 NOTE — ED Notes (Signed)
Pt reported that she takes methadone 55mg  daily . [place] [facility] [place] [phone] [facility] and [facility] confirmed pt was in the clinic this morning and received methadone 55mg .

## 2015-02-09 NOTE — Progress Notes (Signed)
D.  Pt in bed on approach, denies complaints at this time other than anxiety.  Pt has been in bed this shift, minimal interaction.  Denies SI/HI/hallucinations at this time.  Pt did not feel up to attending group this evening.  A.  Support and encouragement offered  R.  Pt remains safe on unit, will continue to monitor.
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
## 2015-02-09 NOTE — ED Notes (Signed)
[facility] Expected date:  Expected time:  Means of arrival:  Comments: HOLD for consult

## 2015-02-09 NOTE — ED Notes (Signed)
Pt rates her depression as a 9 on 1-10 scale with 10 being the most depressed. Pt reports passive si thoughts to OD on heroin. She has lost 20 lbs since January 1,2016. Pt reports stressors as breaking up with her boyfriend who was cheating, moving in with her mother and arguing with her mother. 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 Pt reports that she has relapsed with heroin [facility]. Pt denies hi and denies hallucinations.

## 2015-02-09 NOTE — ED Provider Notes (Signed)
Patient complains of suicidal ideation. She she has not attempted suicide recently however has supervised thoughts. She has placed a gun in her mouth 3 times since the beginning of this year. Patient is alert pleasant cooperative. Depressed affect Results for orders placed or performed during the hospital encounter of 02/09/15  CBC WITH DIFFERENTIAL  Result Value Ref Range   WBC 6.0 4.0 - 10.5 K/uL   RBC 4.32 3.87 - 5.11 MIL/uL   Hemoglobin 13.1 12.0 - 15.0 g/dL   HCT 28.439.2 13.236.0 - 44.046.0 %   MCV 90.7 78.0 - 100.0 fL   MCH 30.3 26.0 - 34.0 pg   MCHC 33.4 30.0 - 36.0 g/dL   RDW 10.213.0 72.511.5 - 36.615.5 %   Platelets 198 150 - 400 K/uL   Neutrophils Relative % 62 43 - 77 %   Neutro Abs 3.8 1.7 - 7.7 K/uL   Lymphocytes Relative 30 12 - 46 %   Lymphs Abs 1.8 0.7 - 4.0 K/uL   Monocytes Relative 6 3 - 12 %   Monocytes Absolute 0.3 0.1 - 1.0 K/uL   Eosinophils Relative 2 0 - 5 %   Eosinophils Absolute 0.1 0.0 - 0.7 K/uL   Basophils Relative 0 0 - 1 %   Basophils Absolute 0.0 0.0 - 0.1 K/uL  Comprehensive metabolic panel  Result Value Ref Range   Sodium 139 135 - 145 mmol/L   Potassium 3.7 3.5 - 5.1 mmol/L   Chloride 107 96 - 112 mmol/L   CO2 25 19 - 32 mmol/L   Glucose, Bld 106 (H) 70 - 99 mg/dL   BUN <5 (L) 6 - 23 mg/dL   Creatinine, Ser 4.400.63 0.50 - 1.10 mg/dL   Calcium 8.9 8.4 - 34.710.5 mg/dL   Total Protein 7.1 6.0 - 8.3 g/dL   Albumin 4.1 3.5 - 5.2 g/dL   AST 32 0 - 37 U/L   ALT 26 0 - 35 U/L   Alkaline Phosphatase 41 39 - 117 U/L   Total Bilirubin 0.5 0.3 - 1.2 mg/dL   GFR calc non Af Amer >90 >90 mL/min   GFR calc Af Amer >90 >90 mL/min   Anion gap 7 5 - 15  Drug screen panel, emergency  Result Value Ref Range   Opiates POSITIVE (A) NONE DETECTED   Cocaine NONE DETECTED NONE DETECTED   Benzodiazepines NONE DETECTED NONE DETECTED   Amphetamines POSITIVE (A) NONE DETECTED   Tetrahydrocannabinol NONE DETECTED NONE DETECTED   Barbiturates NONE DETECTED NONE DETECTED  Ethanol  Result  Value Ref Range   Alcohol, Ethyl (B) <5 0 - 9 mg/dL   No results found.   Doug SouSam Julianna Vanwagner, MD 02/09/15 1536

## 2015-02-09 NOTE — BH Assessment (Addendum)
[facility] Assessment Progress Note  Per Josephine Onuha, NP, [facility]  Per Maryelizabeth Rowanressa Corbett, TS, pt has been assigned to Rm [id number].  Pt has signed VRichardo Priestoluntary Admission and Consent for Treatment, as well as Consent to [facility] Information [facility] to the [facility].  A notification call has been placed.  Signed [facility].  Pt's nurse, Jan, has been notified.  She agrees to send original paperwork along with pt via [facility], and to call report to [phone].  Doylene Canninghomas Yina Riviere, MA Triage Specialist 02/09/2015 @ 16:14  Addendum:  Pt has also signed Consent to Release Information to [facility], [facility].  A notification call has also been placed to them.  Doylene Canninghomas Jahzier Villalon, MA Triage Specialist 02/09/2015 @ 16:26
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
## 2015-02-09 NOTE — Tx Team (Signed)
Initial Interdisciplinary Treatment Plan   PATIENT STRESSORS: Financial difficulties Marital or family conflict Occupational concerns Substance abuse   PATIENT STRENGTHS: Average or above average intelligence Capable of independent living Communication skills General fund of knowledge Motivation for treatment/growth Supportive family/friends   PROBLEM LIST: Problem List/Patient Goals Date to be addressed Date deferred Reason deferred Estimated date of resolution  "substance abuse" 02/09/2015   D/c        "depression" 02/09/2015   D/c        "anxiety" 02/09/2015   D/c                           DISCHARGE CRITERIA:  Ability to meet basic life and health needs Adequate post-discharge living arrangements Improved stabilization in mood, thinking, and/or behavior Medical problems require only outpatient monitoring Motivation to continue treatment in a less acute level of care Need for constant or close observation no longer present Reduction of life-threatening or endangering symptoms to within safe limits Safe-care adequate arrangements made Verbal commitment to aftercare and medication compliance Withdrawal symptoms are absent or subacute and managed without 24-hour nursing intervention  PRELIMINARY DISCHARGE PLAN: Attend aftercare/continuing care group Attend PHP/IOP Attend 12-step recovery group Outpatient therapy Participate in family therapy Return to previous living arrangement  PATIENT/FAMIILY INVOLVEMENT: This treatment plan has been presented to and reviewed with the patient, [facility].  The patient and family have been given the opportunity to ask questions and make suggestions.  Earline MayotteKnight, Neesha Langton Shephard 02/09/2015, 6:51 PM
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
## 2015-02-09 NOTE — [facility] Group Notes (Signed)
Adult Psychoeducational Group Note  Date:  02/09/2015 Time:  9:24 PM  Group Topic/Focus:  AA Meeting  Participation Level:  Did Not Attend  Participation Quality:  None  Affect:  None  Cognitive:  None  Insight: None  Engagement in Group:  None  Modes of Intervention:  Discussion and Education  Additional Comments:  Jeanice LimHolly did not attend group.  Caroll RancherLindsay, Gennifer Potenza A 02/09/2015, 9:24 PM

## 2015-02-10 ENCOUNTER — Encounter (HOSPITAL_COMMUNITY): Payer: Self-pay | Admitting: Registered Nurse

## 2015-02-10 DIAGNOSIS — F1910 Other psychoactive substance abuse, uncomplicated: Secondary | ICD-10-CM

## 2015-02-10 DIAGNOSIS — F332 Major depressive disorder, recurrent severe without psychotic features: Secondary | ICD-10-CM

## 2015-02-10 MED ORDER — METHADONE HCL 5 MG PO TABS
55.0000 mg | ORAL_TABLET | Freq: Every day | ORAL | Status: DC
  Administered 2015-02-10 – 2015-02-16 (×7): 55 mg via ORAL
  Filled 2015-02-10 (×3): qty 5
  Filled 2015-02-10: qty 1
  Filled 2015-02-10 (×2): qty 5
  Filled 2015-02-10 (×4): qty 1
  Filled 2015-02-10: qty 5
  Filled 2015-02-10 (×2): qty 1

## 2015-02-10 MED ORDER — LOPERAMIDE HCL 2 MG PO CAPS: 2.0000 mg | ORAL_CAPSULE | ORAL | Status: DC | PRN

## 2015-02-10 MED ORDER — DICYCLOMINE HCL 20 MG PO TABS: 20.0000 mg | ORAL_TABLET | Freq: Four times a day (QID) | ORAL | Status: DC | PRN

## 2015-02-10 MED ORDER — HYDROXYZINE HCL 25 MG PO TABS: 25.0000 mg | ORAL_TABLET | Freq: Four times a day (QID) | ORAL | Status: DC | PRN

## 2015-02-10 MED ORDER — ONDANSETRON 4 MG PO TBDP: 4.0000 mg | ORAL_TABLET | Freq: Four times a day (QID) | ORAL | Status: AC | PRN

## 2015-02-10 MED ORDER — TRAZODONE HCL 50 MG PO TABS
50.0000 mg | ORAL_TABLET | Freq: Every evening | ORAL | Status: DC | PRN
  Administered 2015-02-10 – 2015-02-13 (×4): 50 mg via ORAL
  Filled 2015-02-10 (×3): qty 1

## 2015-02-10 MED ORDER — METHOCARBAMOL 500 MG PO TABS: 500.0000 mg | ORAL_TABLET | Freq: Three times a day (TID) | ORAL | Status: DC | PRN

## 2015-02-10 MED ORDER — METHADONE HCL 10 MG/ML PO CONC
55.0000 mg | Freq: Every day | ORAL | Status: DC
  Filled 2015-02-10: qty 5.5

## 2015-02-10 MED ORDER — NAPROXEN 500 MG PO TABS: 500.0000 mg | ORAL_TABLET | Freq: Two times a day (BID) | ORAL | Status: DC | PRN

## 2015-02-10 MED ORDER — HYDROXYZINE HCL 25 MG PO TABS
25.0000 mg | ORAL_TABLET | Freq: Three times a day (TID) | ORAL | Status: DC | PRN
  Administered 2015-02-10 – 2015-02-14 (×4): 25 mg via ORAL
  Filled 2015-02-10 (×3): qty 1
  Filled 2015-02-10: qty 20
  Filled 2015-02-10: qty 1

## 2015-02-10 NOTE — Progress Notes (Signed)
[facility] Group Notes:  (Nursing/MHT/Case Management/Adjunct)  Date:  02/10/2015  Time:  2100    Type of Therapy:  wrap up group  Participation Level:  Active  Participation Quality:  Appropriate, Attentive, Sharing and Supportive  Affect:  Irritable  Cognitive:  Appropriate  Insight:  Appropriate  Engagement in Group:  Lacking  Modes of Intervention:  Clarification, Education and Support  Summary of Progress/Problems: Pt shared that she had been doing drugs and living in hotels. Pt wants to get to a healthy place and a healthy state of mind. When asked to give an example of both she reported, stop using to numb the pain, stop lying in bed, and get up and take a shower. Pt said the last time she used to numb the pain, it didn't work for her.  Pt shares that she has a wonderful support system of her mother, father, and aunts.   Shelah LewandowskySquires, Vivi Piccirilli Carol 02/10/2015, 10:29 PM
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
## 2015-02-10 NOTE — [facility] Group Notes (Signed)
[facility] Group Notes:  (Clinical Social Work)  02/10/2015     10-11AM  Summary of Progress/Problems:   The main focus of today's process group was to learn how to use a decisional balance exercise to move forward in the Stages of Change, which were described and discussed.  Motivational Interviewing and a worksheet were utilized to help patients explore in depth the perceived benefits and costs of a self-sabotaging behavior, as well as the  benefits and costs of replacing that with a healthy coping mechanism.   The patient expressed that she engages in isolation as one major unhealthy coping mechanism, but acknowledged that she does many other things on the group-generated list as well.  She was very interactive in the early part of group, contributed to the discussion, but eventually appeared to become more anxious and displayed closed body language.  She asked the RN for medication at the close of group, stating it had never been so late for her to get her medicine.  Type of Therapy:  Group Therapy - Process   Participation Level:  Active  Participation Quality:  Attentive  Affect:  Anxious, Blunted and Depressed  Cognitive:  Alert  Insight:  Developing/Improving  Engagement in Therapy:  Developing/Improving  Modes of Intervention:  Education, Motivational Interviewing  Ambrose MantleMareida Grossman-Orr, LCSW 02/10/2015, 12:32 PM

## 2015-02-10 NOTE — Progress Notes (Addendum)
D:  Patient has been concerned about her methadone.  Has discussed methadone with MD and NP.  Patient denied SI and HI, contracts for safety.  Denied A/V hallucinations. A:  Emotional support and encouragement given patient. R:  Safety maintained with 15 minute checks.  1745  Methadone 55 mg administered to patient per MD orders.  Patient has been sleeping most of afternoon.  Went to dining room for dinner.  Patient was given UA contained for urine sample per MD order.

## 2015-02-10 NOTE — [facility] Group Notes (Signed)
The focus of this group is to educate the patient on the purpose and policies of crisis stabilization and provide a format to answer questions about their admission.  The group details unit policies and expectations of patients while admitted.  Patient did not attend 0900 nurse education orientation group this morning.  Patient stayed in bed sleeping.    
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
## 2015-02-10 NOTE — Plan of Care (Signed)
Problem: Consults Goal: Anxiety Disorder Patient Education See Patient Education Module for eduction specifics.  Outcome: Completed/Met Date Met:  02/10/15 Nurse discussed anxiety/depression with patient.

## 2015-02-10 NOTE — BHH Group Notes (Addendum)
Adult Psychoeducational Group Note  Date:  02/10/2015 Time:  1330  Group Topic/Focus:  Life Skills  Participation Level:  Patient did not attend.  Stayed in bed.  Participation Quality:    Affect:    Cognitive:    Insight:   Engagement in Group:    Modes of Intervention:    Additional Comments:   Earline MayotteKnight, Miles Leyda Shephard 02/10/2015, 6:31 PM
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
## 2015-02-11 DIAGNOSIS — F39 Unspecified mood [affective] disorder: Secondary | ICD-10-CM

## 2015-02-11 DIAGNOSIS — R45851 Suicidal ideations: Secondary | ICD-10-CM

## 2015-02-11 DIAGNOSIS — F322 Major depressive disorder, single episode, severe without psychotic features: Secondary | ICD-10-CM | POA: Diagnosis present

## 2015-02-11 NOTE — Progress Notes (Signed)
D. Pt pleasant on approach, denies complaints at this time other than some continued anxiety.  Positive for evening AA group with appropriate participation.  Denies SI/HI/hallucinations at this time.  A.  Support and encouragement offered, medication given as ordered for anxiety.  R.  Pt remains safe on unit, will continue to monitor.

## 2015-02-11 NOTE — BHH Group Notes (Signed)
BHH Group Notes: (Clinical Social Work)   02/11/2015      Type of Therapy:  Group Therapy   Participation Level:  Did Not Attend despite MHT prompting   Declan Adamson Grossman-Orr, LCSW 02/11/2015, 12:17 PM     

## 2015-02-11 NOTE — Progress Notes (Signed)
Patient did attend the evening speaker AA meeting.  

## 2015-02-11 NOTE — Progress Notes (Signed)
[facility]  02/11/2015 4:06 PM Barrett ShellHolly Grogan  MRN:  [id number] Subjective:  Patient states that she feels kind of sleepy and still depressed.  Denies suicidal thoughts today.  Patient is calmer than yesterday "Thank you for getting everything straighten out yesterday; sorry about that."  Denies homicidal ideation, psychosis, and paranoia.  Patient states that she has been going to group except for earlier today because she was sleepy.  Tolerating medications without adverse effects.    Principal Problem: Mood disorder Diagnosis:   Patient Active Problem List   Diagnosis Date Noted  . MDD (major depressive disorder), severe [F32.2] 02/11/2015  . 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 Mood disorder [F39] 02/09/2015  . UTI (urinary tract infection) [N39.0]    Total Time spent with patient: 30 minutes   Past Medical History:  Past Medical History  Diagnosis Date  . UTI (urinary tract infection)   . Sepsis(995.91)   . Suicidal ideation   . Bipolar 1 disorder     bipolar  . Depression   . Anxiety   . PTSD (post-traumatic stress disorder)   . Drug abuse     Past Surgical History  Procedure Laterality Date  . Appendectomy     Family History:  Family History  Problem Relation Age of Onset  . Cancer Mother    Social History:  History  Alcohol Use  . 0.6 oz/week  . 1 Cans of beer per week    Comment: occasionally     History  Drug Use  . Yes  . Special: Heroin, Cocaine    Comment: methadone, heroin, cocaine    History   Social History  . Marital Status: Single    Spouse Name: N/A  . Number of Children: N/A  . Years of Education: N/A   Social History Main Topics  . Smoking status: Current Every Day Smoker -- 0.25 packs/day    Types: Cigarettes  . Smokeless tobacco: Never Used  . Alcohol Use: 0.6 oz/week    1 Cans of beer per week     Comment: occasionally  . Drug Use: Yes    Special: Heroin, Cocaine     Comment:  methadone, heroin, cocaine  . Sexual Activity: Yes    Birth Control/ Protection: Condom   Other Topics Concern  . None   Social History Narrative   Additional History:    Sleep: Fair  Appetite:  Fair   Assessment:   Musculoskeletal: Strength & Muscle Tone: within normal limits Gait & Station: normal Patient leans: N/A   Psychiatric Specialty Exam: Physical Exam  ROS  Blood pressure 90/60, pulse 79, temperature 98.1 F (36.7 C), temperature source Oral, resp. rate 18, height 5\' 3"  (1.6 m), weight 57.607 kg (127 lb), last menstrual period 12/11/2014, SpO2 100 %.Body mass index is 22.5 kg/(m^2).  General Appearance: Casual  Eye Contact::  Good  Speech:  Blocked and Normal Rate  Volume:  Normal  Mood:  Depressed  Affect:  Congruent  Thought Process:  Circumstantial  Orientation:  Full (Time, Place, and Person)  Thought Content:  "I'm depressed"  Suicidal Thoughts:  Yes.  with intent/plan  Homicidal Thoughts:  No  Memory:  Immediate;   Good Recent;   Good Remote;   Good  Judgement:  Fair  Insight:  Fair  Psychomotor Activity:  Normal  Concentration:  Fair  Recall:  Good  Fund of Knowledge:Good  Language: Good  Akathisia:  No  Handed:  Right  AIMS (  if indicated):     Assets:  Communication Skills Desire for Improvement Housing Social Support  ADL's:  Intact  Cognition: WNL  Sleep:  Number of Hours: 6.5     Current Medications: Current Facility-Administered Medications  Medication Dose Route Frequency Provider Last Rate Last Dose  . acetaminophen (TYLENOL) tablet 650 mg  650 mg Oral Q4H PRN Earney Navy, NP      . alum & mag hydroxide-simeth (MAALOX/MYLANTA) 200-200-20 MG/5ML suspension 30 mL  30 mL Oral PRN Earney Navy, NP      . hydrOXYzine (ATARAX/VISTARIL) tablet 25 mg  25 mg Oral TID PRN Shuvon Rankin, NP   25 mg at 02/10/15 2113  . methadone (DOLOPHINE) tablet 55 mg  55 mg Oral Daily Rachael Fee, MD   55 mg at 02/11/15 0825  .  nicotine (NICODERM CQ - dosed in mg/24 hours) patch 21 mg  21 mg Transdermal Daily Earney Navy, NP   21 mg at 02/10/15 0858  . ondansetron (ZOFRAN-ODT) disintegrating tablet 4 mg  4 mg Oral Q6H PRN Shuvon Rankin, NP      . traZODone (DESYREL) tablet 50 mg  50 mg Oral QHS PRN Shuvon Rankin, NP   50 mg at 02/10/15 2113    Lab Results: No results found for this or any previous visit (from the past 48 hour(s)).  Physical Findings: AIMS: Facial and Oral Movements Muscles of Facial Expression: None, normal Lips and Perioral Area: None, normal Jaw: None, normal Tongue: None, normal,Extremity Movements Upper (arms, wrists, hands, fingers): None, normal Lower (legs, knees, ankles, toes): None, normal, Trunk Movements Neck, shoulders, hips: None, normal, Overall Severity Severity of abnormal movements (highest score from questions above): None, normal Incapacitation due to abnormal movements: None, normal Patient's awareness of abnormal movements (rate only patient's report): No Awareness, Dental Status Current problems with teeth and/or dentures?: No Does patient usually wear dentures?: No  CIWA:  CIWA-Ar Total: 1 COWS:  COWS Total Score: 1  Treatment Plan Summary: Daily contact with patient to assess and evaluate symptoms and progress in treatment and Medication management  Will continue with current treatment plan and no changes at this time; will consider starting medication for mood disorder.    Medical Decision Making:  Established Problem, Stable/Improving (1), Review of Psycho-Social Stressors (1), Review of Last Therapy Session (1) and Review of Medication Regimen & Side Effects (2)   Rankin, Shuvon, FNP-BC 02/11/2015, 4:06 PM

## 2015-02-11 NOTE — BHH Group Notes (Signed)
Adult Psychoeducational Group Note  Date:  02/11/2015 Time:  1330  Group Topic/Focus:  Making Healthy Choices:   The focus of this group is to help patients identify negative/unhealthy choices they were using prior to admission and identify positive/healthier coping strategies to replace them upon discharge.  Participation Level:  Did Not Attend  Participation Quality:    Affect:    Cognitive:    Insight:   Engagement in Group:    Modes of Intervention:    Additional Comments:    Earline MayotteKnight, Demiana Crumbley Shephard 02/11/2015, 5:36 PM

## 2015-02-11 NOTE — BHH Group Notes (Signed)
The focus of this group is to educate the patient on the purpose and policies of crisis stabilization and provide a format to answer questions about their admission.  The group details unit policies and expectations of patients while admitted.  Patient did not attend 0900 nurse education orientation group this morning.  Patient stayed in bed.   

## 2015-02-11 NOTE — Progress Notes (Signed)
D:  Patient's self inventory sheet, patient slept good, sleep medication was helpful.  Good appetite, low energy level, good concentration.  Rated depression 8, hopeless 7, anxiety 3.  Denied withdrawals.  Denied SI.  Stated he has felt lightheaded, dizziness in past 24 hours.  Denied physical pain.  Goal is to find out what medication will help me with bipolar and depression and where she can continue to get her meds after discharge.  How can she get medicaid? A:  Medications administered per MD orders.  Emotional support and encouragement given patient. R:  Denied SI and HI.  Denied A/V hallucinations.  Safety maintained with 15 minute checks.

## 2015-02-11 NOTE — Progress Notes (Signed)
D.  Pt pleasant but anxious on approach, somewhat guarded.  Positive for evening wrap up group, interacting appropriately with peers on the unit.  Denies complaints at this time.  Denies SI/HI/hallucinations at this time.  A.  Support and encouragement offered, medication given as ordered for anxiety.  R.  Pt remains safe on the unit, will continue to monitor.

## 2015-02-11 NOTE — Plan of Care (Signed)
Problem: Consults Goal: Depression Patient Education See Patient Education Module for education specifics.  Outcome: Completed/Met Date Met:  02/11/15 Nurse discussed depression with patient.

## 2015-02-12 MED ORDER — LAMOTRIGINE 25 MG PO TABS
25.0000 mg | ORAL_TABLET | Freq: Every day | ORAL | Status: DC
  Administered 2015-02-12 – 2015-02-16 (×5): 25 mg via ORAL
  Filled 2015-02-12 (×6): qty 1
  Filled 2015-02-12: qty 14
  Filled 2015-02-12: qty 1

## 2015-02-12 NOTE — [facility] Group Notes (Signed)
[facility]   02/12/2015 9:40 AM  Participation Quality:  Appropriate   Mood/Affect:  Appropriate  Depression Rating:  6-7  Anxiety Rating:  1  Thoughts of Suicide:  No Will you contract for safety?   NA  Current AVH:  No  Plan for Discharge/Comments:  Pt reports that she plans to return home/[facility] [place] [facility] [place]. Pt requesting referral for therapy. CSW assessing. Pt reports high depression and wants to be put on depression medication. CSW notified MD. Minimal withdrawals reported.   Transportation Means: Mother?  Supports: mother/some friends.   Smart, American FinancialHeather LCSWA

## 2015-02-12 NOTE — BHH Group Notes (Signed)
BHH LCSW Group Therapy  02/12/2015 3:05 PM  Type of Therapy:  Group Therapy  Participation Level:  Minimal   Participation Quality:  Inattentive  Affect:  Appropriate  Cognitive:  Lacking  Insight:  Limited  Engagement in Therapy:  Limited  Modes of Intervention:  Confrontation, Discussion, Education, Exploration, Problem-solving, Rapport Building, Socialization and Support  Summary of Progress/Problems: Today's Topic: Overcoming Obstacles. Pt identified obstacles faced currently and processed barriers involved in overcoming these obstacles. Pt identified steps necessary for overcoming these obstacles and explored motivation (internal and external) for facing these difficulties head on. Pt further identified one area of concern in their lives and chose a skill of focus pulled from their "toolbox." Pt was inattentive and disengaged during today's processing group. She sat and colored quietly during group and did not actively participate in group discussion unless prompted by CSW. Pt reported that she was put on antidepressant medication this morning and is hopeful that this will help her better manage depressive Sx. She identified her primary obstacle as "going back home to my mom. We fight all the time because I have to ask her for money." Pt discussed her desire for further tx but when give the option of an [facility] referral, turned it down stating, I just don't think it will help." Diane Foley continues to demonstrate limited insight and limited progress in the group setting.    Smart, Diane Foley LCSWA  02/12/2015, 3:05 PM
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
## 2015-02-12 NOTE — Progress Notes (Signed)
Adult Psychoeducational Group Note  Date:  02/12/2015 Time:  9:39 PM  Group Topic/Focus:  Wrap-Up Group:   The focus of this group is to help patients review their daily goal of treatment and discuss progress on daily workbooks.  Participation Level:  Active  Participation Quality:  Appropriate  Affect:  Appropriate  Cognitive:  Appropriate  Insight: Appropriate  Engagement in Group:  Engaged  Modes of Intervention:  Discussion  Additional Comments:  Purpose of group was to discuss how pt day went. Pt stated that "today has been blah and slept most of it." pt also stated that she does not want to leave because "leaving means going back to reality." pt states that she is also concerned about Hep C.  Margorie Johnege, Corby Villasenor 02/12/2015, 9:39 PM

## 2015-02-12 NOTE — Progress Notes (Signed)
D:  Patient's self inventory sheet, patient slept good last night, sleep medication is helpful.  Good appetite, low energy level, good concentration.  Rated depression 6-7, hopeless 5, anxiety 2.  Denied withdrawals.  Denied SI.  Denied physical pain.  Goal is to get on the medications needed for bipolar, depression and anxiety.  Talk to MD.   Bluford MainFeels she may need to go to a 30 day program after discharge. A:  Medications administered per MD orders.  Emotional support and encouragement given patient. R:  Denied SI and HI, contracts for safety.  Denied A/V hallucinations.  Safety maintained with 15 minute checks.

## 2015-02-12 NOTE — Plan of Care (Signed)
Problem: Consults Goal: Suicide Risk Patient Education (See Patient Education module for education specifics)  Outcome: Completed/Met Date Met:  02/12/15 Nurse discussed suicidal information with patient.

## 2015-02-12 NOTE — Progress Notes (Signed)
[facility] MD Progress Note  02/12/2015 10:33 AM Diane Foley  MRN:  [id number] Subjective:  Diane Foley is having a very hard time. She is on methadone and she is going to have a hard time affording it. States she does not see herself as able to go of right now as states she is dealing with a bunch of stuff. She broke up with BF. She is "dancing" to get some money although she does not want to do it. She wants to get her life together. She would like to go back to school and further her education. She would like to go to a long term program but states she has gotten more frustrated as she found out they will not take her on methadone. Feel stuck Principal Problem: Mood disorder Diagnosis:   Patient Active Problem List   Diagnosis Date Noted  . MDD (major depressive disorder), severe [F32.2] 02/11/2015  . 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 Mood disorder [F39] 02/09/2015  . UTI (urinary tract infection) [N39.0]    Total Time spent with patient: 30 minutes   Past Medical History:  Past Medical History  Diagnosis Date  . UTI (urinary tract infection)   . Sepsis(995.91)   . Suicidal ideation   . Bipolar 1 disorder     bipolar  . Depression   . Anxiety   . PTSD (post-traumatic stress disorder)   . Drug abuse     Past Surgical History  Procedure Laterality Date  . Appendectomy     Family History:  Family History  Problem Relation Age of Onset  . Cancer Mother    Social History:  History  Alcohol Use  . 0.6 oz/week  . 1 Cans of beer per week    Comment: occasionally     History  Drug Use  . Yes  . Special: Heroin, Cocaine    Comment: methadone, heroin, cocaine    History   Social History  . Marital Status: Single    Spouse Name: N/A  . Number of Children: N/A  . Years of Education: N/A   Social History Main Topics  . Smoking status: Current Every Day Smoker -- 0.25 packs/day    Types: Cigarettes  . Smokeless tobacco: Never Used  .  Alcohol Use: 0.6 oz/week    1 Cans of beer per week     Comment: occasionally  . Drug Use: Yes    Special: Heroin, Cocaine     Comment: methadone, heroin, cocaine  . Sexual Activity: Yes    Birth Control/ Protection: Condom   Other Topics Concern  . None   Social History Narrative   Additional History:    Sleep: Poor  Appetite:  Fair   Assessment:   Musculoskeletal: Strength & Muscle Tone: within normal limits Gait & Station: normal Patient leans: N/A   Psychiatric Specialty Exam: Physical Exam  Review of Systems  Constitutional: Positive for weight loss and malaise/fatigue.  HENT: Negative.   Eyes: Negative.   Respiratory:       Pack two or three days  Cardiovascular: Negative.   Gastrointestinal: Negative.   Genitourinary: Negative.   Musculoskeletal: Negative.   Skin: Negative.   Neurological: Positive for dizziness, tremors and weakness.  Endo/Heme/Allergies: Negative.   Psychiatric/Behavioral: Positive for depression, suicidal ideas and substance abuse. The patient is nervous/anxious.     Blood pressure 90/57, pulse 86, temperature 98.1 F (36.7 C), temperature source Oral, resp. rate 20, height 5\' 3"  (1.6 m), weight 57.607 kg (  127 lb), last menstrual period 12/11/2014, SpO2 100 %.Body mass index is 22.5 kg/(m^2).  General Appearance: Disheveled  Eye Solicitor::  Fair  Speech:  Clear and Coherent  Volume:  fluctuates  Mood:  Anxious, Depressed, Dysphoric and worried  Affect:  anxious worried concerned   Thought Process:  Coherent and Goal Directed  Orientation:  Full (Time, Place, and Person)  Thought Content:  symptoms events worries concerns  Suicidal Thoughts:  No  Homicidal Thoughts:  No  Memory:  Immediate;   Fair Recent;   Fair Remote;   Fair  Judgement:  Fair  Insight:  Present and Shallow  Psychomotor Activity:  Restlessness  Concentration:  Fair  Recall:  [employer] of Knowledge:Fair  Language: Fair  Akathisia:  No  Handed:  Right   AIMS (if indicated):     Assets:  Desire for Improvement  ADL's:  Intact  Cognition: WNL  Sleep:  Number of Hours: 6     Current Medications: Current Facility-Administered Medications  Medication Dose Route Frequency Provider Last Rate Last Dose  . acetaminophen (TYLENOL) tablet 650 mg  650 mg Oral Q4H PRN Earney Navy, NP      . alum & mag hydroxide-simeth (MAALOX/MYLANTA) 200-200-20 MG/5ML suspension 30 mL  30 mL Oral PRN Earney Navy, NP      . hydrOXYzine (ATARAX/VISTARIL) tablet 25 mg  25 mg Oral TID PRN Shuvon B Rankin, NP   25 mg at 02/11/15 2129  . methadone (DOLOPHINE) tablet 55 mg  55 mg Oral Daily Rachael Fee, MD   55 mg at 02/12/15 0806  . nicotine (NICODERM CQ - dosed in mg/24 hours) patch 21 mg  21 mg Transdermal Daily Earney Navy, NP   21 mg at 02/10/15 0858  . ondansetron (ZOFRAN-ODT) disintegrating tablet 4 mg  4 mg Oral Q6H PRN Shuvon B Rankin, NP      . traZODone (DESYREL) tablet 50 mg  50 mg Oral QHS PRN Shuvon B Rankin, NP   50 mg at 02/11/15 2129    Lab Results: No results found for this or any previous visit (from the past 48 hour(s)).  Physical Findings: AIMS: Facial and Oral Movements Muscles of Facial Expression: None, normal Lips and Perioral Area: None, normal Jaw: None, normal Tongue: None, normal,Extremity Movements Upper (arms, wrists, hands, fingers): None, normal Lower (legs, knees, ankles, toes): None, normal, Trunk Movements Neck, shoulders, hips: None, normal, Overall Severity Severity of abnormal movements (highest score from questions above): None, normal Incapacitation due to abnormal movements: None, normal Patient's awareness of abnormal movements (rate only patient's report): No Awareness, Dental Status Current problems with teeth and/or dentures?: No Does patient usually wear dentures?: No  CIWA:  CIWA-Ar Total: 1 COWS:  COWS Total Score: 1  Treatment Plan Summary: Daily contact with patient to assess and  evaluate symptoms and progress in treatment and Medication management Supportive approach/coping skills Opioid Dependence: continue opioid replacement therapy: methadone Mood instability: will use Lamictal 25 mg daily with plans to increase as indicated Will explore other residential treatment options like [name]  Will use CBT/mindfulness Will work on a relapse prevention plan  Medical Decision Making:  Review of Psycho-Social Stressors (1), Review or order clinical lab tests (1), Review of Medication Regimen & Side Effects (2) and Review of New Medication or Change in Dosage (2)     Jairus Tonne A 02/12/2015, 10:33 AM

## 2015-02-13 NOTE — Plan of Care (Signed)
Problem: Consults Goal: [facility] Treatment Patient Education Outcome: Completed/Met Date Met:  02/13/15 Nurse discussed treatment plan with patient.

## 2015-02-13 NOTE — Progress Notes (Signed)
[facility] MD Progress Note  02/13/2015 6:34 PM Diane Foley  MRN:  [id number] Subjective:  Arbell continues to have a hard time. She is "dreading" getting out of here. She states she is going to have a hard time affording the methadone unless her mother helps her or she is able to get medicaid. States if she has to "dance" to be able to get money and dancing is a trigger for her. State she would like to go to a 30 day program but state she is not ready to be detox. Admits she is very overwhelmed. She will be going back to her mother's house and states that situation is very stressful mostly so with her mother upset giving her a hard time as she is using the methadone. Her mother she claims has Bipolar Disorder and she is only on Lexapro and she has "severe' mood swings Principal Problem: Mood disorder Diagnosis:   Patient Active Problem List   Diagnosis Date Noted  . MDD (major depressive disorder), severe [F32.2] 02/11/2015  . 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 Mood disorder [F39] 02/09/2015  . UTI (urinary tract infection) [N39.0]    Total Time spent with patient: 30 minutes   Past Medical History:  Past Medical History  Diagnosis Date  . UTI (urinary tract infection)   . Sepsis(995.91)   . Suicidal ideation   . Bipolar 1 disorder     bipolar  . Depression   . Anxiety   . PTSD (post-traumatic stress disorder)   . Drug abuse     Past Surgical History  Procedure Laterality Date  . Appendectomy     Family History:  Family History  Problem Relation Age of Onset  . Cancer Mother    Social History:  History  Alcohol Use  . 0.6 oz/week  . 1 Cans of beer per week    Comment: occasionally     History  Drug Use  . Yes  . Special: Heroin, Cocaine    Comment: methadone, heroin, cocaine    History   Social History  . Marital Status: Single    Spouse Name: N/A  . Number of Children: N/A  . Years of Education: N/A   Social History Main  Topics  . Smoking status: Current Every Day Smoker -- 0.25 packs/day    Types: Cigarettes  . Smokeless tobacco: Never Used  . Alcohol Use: 0.6 oz/week    1 Cans of beer per week     Comment: occasionally  . Drug Use: Yes    Special: Heroin, Cocaine     Comment: methadone, heroin, cocaine  . Sexual Activity: Yes    Birth Control/ Protection: Condom   Other Topics Concern  . None   Social History Narrative   Additional History:    Sleep: Fair  Appetite:  Fair   Assessment:   Musculoskeletal: Strength & Muscle Tone: within normal limits Gait & Station: normal Patient leans: N/A   Psychiatric Specialty Exam: Physical Exam  Review of Systems  Constitutional: Positive for malaise/fatigue.  HENT: Negative.   Eyes: Negative.   Respiratory: Negative.   Cardiovascular: Negative.   Gastrointestinal: Negative.   Genitourinary: Negative.   Musculoskeletal: Negative.   Skin: Negative.   Neurological: Positive for weakness.  Endo/Heme/Allergies: Negative.   Psychiatric/Behavioral: Positive for depression and substance abuse. The patient is nervous/anxious.     Blood pressure 104/47, pulse 64, temperature 98 F (36.7 C), temperature source Oral, resp. rate 14, height  (1.6  m), weight 57.607 kg (127 lb), last menstrual period 12/11/2014, SpO2 100 %.Body mass index is 22.5 kg/(m^2).  General Appearance: Fairly Groomed  Patent attorney::  Fair  Speech:  Clear and Coherent  Volume:  fluctuates  Mood:  Anxious, Depressed and worried  Affect:  anxious worried  Thought Process:  Coherent and Goal Directed  Orientation:  Full (Time, Place, and Person)  Thought Content:  symptoms events worries concerns  Suicidal Thoughts:  No  Homicidal Thoughts:  No  Memory:  Immediate;   Fair Recent;   Fair Remote;   Fair  Judgement:  Fair  Insight:  Present and Shallow  Psychomotor Activity:  Restlessness  Concentration:  Fair  Recall:  [employer] of Knowledge:Fair  Language: Fair   Akathisia:  No  Handed:  Right  AIMS (if indicated):     Assets:  Desire for Improvement Housing  ADL's:  Intact  Cognition: WNL  Sleep:  Number of Hours: 6     Current Medications: Current Facility-Administered Medications  Medication Dose Route Frequency Provider Last Rate Last Dose  . acetaminophen (TYLENOL) tablet 650 mg  650 mg Oral Q4H PRN Earney Navy, NP      . alum & mag hydroxide-simeth (MAALOX/MYLANTA) 200-200-20 MG/5ML suspension 30 mL  30 mL Oral PRN Earney Navy, NP      . hydrOXYzine (ATARAX/VISTARIL) tablet 25 mg  25 mg Oral TID PRN Shuvon B Rankin, NP   25 mg at 02/11/15 2129  . lamoTRIgine (LAMICTAL) tablet 25 mg  25 mg Oral Daily Rachael Fee, MD   25 mg at 02/13/15 0725  . methadone (DOLOPHINE) tablet 55 mg  55 mg Oral Daily Rachael Fee, MD   55 mg at 02/13/15 0725  . nicotine (NICODERM CQ - dosed in mg/24 hours) patch 21 mg  21 mg Transdermal Daily Earney Navy, NP   21 mg at 02/10/15 0858  . ondansetron (ZOFRAN-ODT) disintegrating tablet 4 mg  4 mg Oral Q6H PRN Shuvon B Rankin, NP      . traZODone (DESYREL) tablet 50 mg  50 mg Oral QHS PRN Shuvon B Rankin, NP   50 mg at 02/12/15 2208    Lab Results:  Results for orders placed or performed during the hospital encounter of 02/09/15 (from the past 48 hour(s))  Hepatitis panel, acute     Status: Abnormal   Collection Time: 02/12/15  8:17 PM  Result Value Ref Range   Hepatitis B Surface Ag NEGATIVE NEGATIVE   HCV Ab Reactive (A) NEGATIVE   Hep A IgM NON REACTIVE NON REACTIVE    Comment: (NOTE) Effective October 23, 2014, Hepatitis Acute Panel (test code 16109) will be revised to automatically reflex to the Hepatitis C Viral RNA, Quantitative, Real-Time PCR assay if the Hepatitis C antibody screening result is Reactive. This action is being taken to ensure that the CDC/USPSTF recommended HCV diagnostic algorithm with the appropriate test reflex needed for accurate interpretation  is followed.    Hep B C IgM NON REACTIVE NON REACTIVE    Comment: (NOTE) High levels of Hepatitis B Core IgM antibody are detectable during the acute stage of Hepatitis B. This antibody is used to differentiate current from past HBV infection. Performed at [facility]     Physical Findings: AIMS: Facial and Oral Movements Muscles of Facial Expression: None, normal Lips and Perioral Area: None, normal Jaw: None, normal Tongue: None, normal,Extremity Movements Upper (arms, wrists, hands, fingers): None, normal Lower (legs,  knees, ankles, toes): None, normal, Trunk Movements Neck, shoulders, hips: None, normal, Overall Severity Severity of abnormal movements (highest score from questions above): None, normal Incapacitation due to abnormal movements: None, normal Patient's awareness of abnormal movements (rate only patient's report): No Awareness, Dental Status Current problems with teeth and/or dentures?: No Does patient usually wear dentures?: No  CIWA:  CIWA-Ar Total: 1 COWS:  COWS Total Score: 1  Treatment Plan Summary: Daily contact with patient to assess and evaluate symptoms and progress in treatment and Medication management Opioid Dependence: continue replacement therapy with methadone                                   Figure out options of treatment once she gets out of here including a [facility] after the methadone is down to 40 ( currently 6955 ) for detox as well as further stabilization Mood Disorder: will continue the Lamictal and reassess Work a relapse prevention plan: alternatives to dancing as she recognizes it is a trigger Work on Optician, dispensingstress management and how to deal with her mother's mood swings Medical Decision Making:  Review of Psycho-Social Stressors (1), Review or order clinical lab tests (1), Review of Medication Regimen & Side Effects (2) and Review of New Medication or Change in Dosage  (2)     Shakeyla Giebler A 02/13/2015, 6:34 PM

## 2015-02-13 NOTE — Progress Notes (Signed)
D:  Patient's self inventory sheet, patient has poor sleep, no sleep medication given.  Good appetite, low energy level, poor concentration.  Depression 6, hopeless 4, anxiety 1.  Denied withdrawals.  Denied SI and HI, contracts for safety.  Denied A/V hallucinations.  Denied physical pain.  Goal is to think positive. A:  Medications administered per MD orders.  Emotional support and encouragement given patient. R:  Safety maintained with 15 minute checks.

## 2015-02-13 NOTE — [facility] Group Notes (Signed)
The focus of this group is to educate the patient on the purpose and policies of crisis stabilization and provide a format to answer questions about their admission.  The group details unit policies and expectations of patients while admitted.  Patient did not attend 0900 nurse education orientation group this morning.  Patient stayed in bed sleeping.    

## 2015-02-13 NOTE — Progress Notes (Signed)
Recreation Therapy Notes  Animal-Assisted Activity/Therapy (AAA/T) Program Checklist/Progress Notes Patient Eligibility Criteria Checklist & Daily Group note for Rec Tx Intervention  Date: 03.08.2016 Time: 2:45pm [address]    AAA/T Program Assumption of Risk Form signed by Patient/ or Parent Legal Guardian yes  Patient is free of allergies or sever asthma yes  Patient reports no fear of animals yes  Patient reports no history of cruelty to animals yes  Patient understands his/her participation is voluntary yes  Behavioral Response: Did not attend.   Marykay Lexenise L Kelcie Currie, LRT/CTRS  Jearl KlinefelterBlanchfield, Karigan Cloninger L 02/13/2015 4:58 PM

## 2015-02-13 NOTE — Progress Notes (Signed)
D   Pt is pleasant on approach and cooperative   She continues to endorse some depression and anxiety but does report improvement since hospitalization   She attends and participates in groups and interacts appropriately with other pts A   Verbal support given   Medications administered and effectiveness monitored   Q 15 min checks R    Pt safe at present

## 2015-02-13 NOTE — [facility] Group Notes (Signed)
[facility] LCSW Group Therapy  02/13/2015 1:27 PM  Type of Therapy:  Group Therapy  Participation Level:  Active  Participation Quality:  Attentive  Affect:  Flat  Cognitive:  Oriented  Insight:  Improving  Engagement in Therapy:  Improving  Modes of Intervention:  Discussion, Education, Exploration, Problem-solving, Rapport Building, Socialization and Support  Summary of Progress/Problems: [facility] Speaker came to talk about his personal journey with substance abuse and addiction. The pt processed ways by which to relate to the speaker. [facility]HA speaker provided handouts and educational information pertaining to groups and services offered by the [facility].   Smart, Eva Vallee LCSWA 02/13/2015, 1:27 PM

## 2015-02-14 MED ORDER — TRAZODONE HCL 100 MG PO TABS
100.0000 mg | ORAL_TABLET | Freq: Every evening | ORAL | Status: DC | PRN
  Administered 2015-02-14 – 2015-02-15 (×2): 100 mg via ORAL
  Filled 2015-02-14 (×4): qty 1
  Filled 2015-02-14 (×2): qty 28
  Filled 2015-02-14 (×3): qty 1

## 2015-02-14 NOTE — Progress Notes (Signed)
[facility] MD Progress Note  02/14/2015 6:17 PM Jillayne Witte  MRN:  [id number] Subjective:  Upset after she found out she tested positive for Hep C. She does not want to share this with her mother as states she does not want to cause his mother pain as she would never have thought that her little girl would have ended like this. She is still going to stay on the methadone 55 and plan to decrease by 5 mg every 2 weeks as planned by the clinic but given the fact she will not be able to afford it for too long she is receptive to the idea of being referred [facility]. She states she would really like to get a "normal" job and not have to go back to dancing.  Principal Problem: Mood disorder Diagnosis:   Patient Active Problem List   Diagnosis Date Noted  . MDD (major depressive disorder), severe [F32.2] 02/11/2015  . 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 Mood disorder [F39] 02/09/2015  . UTI (urinary tract infection) [N39.0]    Total Time spent with patient: 30 minutes   Past Medical History:  Past Medical History  Diagnosis Date  . UTI (urinary tract infection)   . Sepsis(995.91)   . Suicidal ideation   . Bipolar 1 disorder     bipolar  . Depression   . Anxiety   . PTSD (post-traumatic stress disorder)   . Drug abuse     Past Surgical History  Procedure Laterality Date  . Appendectomy     Family History:  Family History  Problem Relation Age of Onset  . Cancer Mother    Social History:  History  Alcohol Use  . 0.6 oz/week  . 1 Cans of beer per week    Comment: occasionally     History  Drug Use  . Yes  . Special: Heroin, Cocaine    Comment: methadone, heroin, cocaine    History   Social History  . Marital Status: Single    Spouse Name: N/A  . Number of Children: N/A  . Years of Education: N/A   Social History Main Topics  . Smoking status: Current Every Day Smoker -- 0.25 packs/day    Types: Cigarettes  .  Smokeless tobacco: Never Used  . Alcohol Use: 0.6 oz/week    1 Cans of beer per week     Comment: occasionally  . Drug Use: Yes    Special: Heroin, Cocaine     Comment: methadone, heroin, cocaine  . Sexual Activity: Yes    Birth Control/ Protection: Condom   Other Topics Concern  . None   Social History Narrative   Additional History:    Sleep: Fair  Appetite:  Fair   Assessment:   Musculoskeletal: Strength & Muscle Tone: within normal limits Gait & Station: normal Patient leans: N/A   Psychiatric Specialty Exam: Physical Exam  Review of Systems  Constitutional: Negative.   HENT: Negative.   Eyes: Negative.   Respiratory: Negative.   Cardiovascular: Negative.   Gastrointestinal: Negative.   Genitourinary: Negative.   Musculoskeletal: Negative.   Skin: Negative.   Neurological: Negative.   Endo/Heme/Allergies: Negative.   Psychiatric/Behavioral: Positive for depression and substance abuse. The patient is nervous/anxious.     Blood pressure 87/55, pulse 114, temperature 97.8 F (36.6 C), temperature source Oral, resp. rate 20, height  (1.6 m), weight 57.607 kg (127 lb), last menstrual period 12/11/2014, SpO2 100 %.Body mass index  is 22.5 kg/(m^2).  General Appearance: Fairly Groomed  Patent attorneyye Contact::  Fair  Speech:  Clear and Coherent  Volume:  Decreased  Mood:  Anxious, Depressed and worried  Affect:  sad, anxious worried  Thought Process:  Coherent and Goal Directed  Orientation:  Full (Time, Place, and Person)  Thought Content:  worries about the Hep C diagnosis, symptoms concerns  Suicidal Thoughts:  No  Homicidal Thoughts:  No  Memory:  Immediate;   Fair Recent;   Fair Remote;   Fair  Judgement:  Fair  Insight:  Present  Psychomotor Activity:  Restlessness  Concentration:  Fair  Recall:  FiservFair  Fund of Knowledge:Fair  Language: Fair  Akathisia:  No  Handed:  Right  AIMS (if indicated):     Assets:  Desire for Improvement Housing  ADL's:   Intact  Cognition: WNL  Sleep:  Number of Hours: 5     Current Medications: Current Facility-Administered Medications  Medication Dose Route Frequency Provider Last Rate Last Dose  . acetaminophen (TYLENOL) tablet 650 mg  650 mg Oral Q4H PRN Earney NavyJosephine C Onuoha, NP      . alum & mag hydroxide-simeth (MAALOX/MYLANTA) 200-200-20 MG/5ML suspension 30 mL  30 mL Oral PRN Earney NavyJosephine C Onuoha, NP      . hydrOXYzine (ATARAX/VISTARIL) tablet 25 mg  25 mg Oral TID PRN Shuvon B Rankin, NP   25 mg at 02/13/15 2318  . lamoTRIgine (LAMICTAL) tablet 25 mg  25 mg Oral Daily Rachael FeeIrving A Yosgart Pavey, MD   25 mg at 02/14/15 0830  . methadone (DOLOPHINE) tablet 55 mg  55 mg Oral Daily Rachael FeeIrving A Roberth Berling, MD   55 mg at 02/14/15 0829  . nicotine (NICODERM CQ - dosed in mg/24 hours) patch 21 mg  21 mg Transdermal Daily Earney NavyJosephine C Onuoha, NP   21 mg at 02/10/15 0858  . ondansetron (ZOFRAN-ODT) disintegrating tablet 4 mg  4 mg Oral Q6H PRN Shuvon B Rankin, NP      . traZODone (DESYREL) tablet 50 mg  50 mg Oral QHS PRN Shuvon B Rankin, NP   50 mg at 02/13/15 2318    Lab Results:  Results for orders placed or performed during the hospital encounter of 02/09/15 (from the past 48 hour(s))  Hepatitis panel, acute     Status: Abnormal   Collection Time: 02/12/15  8:17 PM  Result Value Ref Range   Hepatitis B Surface Ag NEGATIVE NEGATIVE   HCV Ab Reactive (A) NEGATIVE   Hep A IgM NON REACTIVE NON REACTIVE    Comment: (NOTE) Effective October 23, 2014, Hepatitis Acute Panel (test code [id number]) will be revised to automatically reflex to the Hepatitis C Viral RNA, Quantitative, Real-Time PCR assay if the Hepatitis C antibody screening result is Reactive. This action is being taken to ensure that the CDC/USPSTF recommended HCV diagnostic algorithm with the appropriate test reflex needed for accurate interpretation is followed.    Hep B C IgM NON REACTIVE NON REACTIVE    Comment: (NOTE) High levels of Hepatitis B Core IgM antibody  are detectable during the acute stage of Hepatitis B. This antibody is used to differentiate current from past HBV infection. Performed at [facility]     Physical Findings: AIMS: Facial and Oral Movements Muscles of Facial Expression: None, normal Lips and Perioral Area: None, normal Jaw: None, normal Tongue: None, normal,Extremity Movements Upper (arms, wrists, hands, fingers): None, normal Lower (legs, knees, ankles, toes): None, normal, Trunk Movements Neck, shoulders, hips: None, normal,  Overall Severity Severity of abnormal movements (highest score from questions above): None, normal Incapacitation due to abnormal movements: None, normal Patient's awareness of abnormal movements (rate only patient's report): No Awareness, Dental Status Current problems with teeth and/or dentures?: No Does patient usually wear dentures?: No  CIWA:  CIWA-Ar Total: 1 COWS:  COWS Total Score: 1  Treatment Plan Summary: Daily contact with patient to assess and evaluate symptoms and progress in treatment and Medication management Supportive approach/coping skills Opioid Dependence: continue the Methadone maintenance at 55 mg Mood instability: continue the Lamictal; reports no itching today Hep C; provide further education in terms of the disease and its treatment Continue to work on a relapse prevention plan  Medical Decision Making:  Review of Psycho-Social Stressors (1), Review or order clinical lab tests (1), Review of Medication Regimen & Side Effects (2) and Review of New Medication or Change in Dosage (2)     Juliya Magill A 02/14/2015, 6:17 PM

## 2015-02-14 NOTE — Progress Notes (Signed)
Recreation Therapy Notes  Date: 03.09.2016 Time: 9:30am Location: [address] Group Room   Group Topic: Stress Management  Goal Area(s) Addresses:  Patient will actively participate in stress management techniques presented during session.   Behavioral Response: Did not attend.   Caylie Sandquist L Idara Woodside, LRT/CTRS  Maye Parkinson L 02/14/2015 1:49 PM 

## 2015-02-14 NOTE — [facility] Group Notes (Signed)
[facility]   02/14/2015 9:41 AM  Participation Quality:  Appropriate   Mood/Affect:  Appropriate  Depression Rating:  6  Anxiety Rating:  0  Thoughts of Suicide:  No Will you contract for safety?   NA  Current AVH:  No  Plan for Discharge/Comments:  Pt reports that she is planning to return home with her mother at d/c. "I kind of dread leaving here." Pt reports that she is ruminating about her boyfriend breaking up with her. Otherwise, pt reports feeling "okay" today with no withdrawals and no other medication concerns. Pt [facility] where [facility] receiving meth[facility]one tx and s/a counseling. Pt reports no sleeping at night.   Transportation Means: mother   Supports: mother/some friends   Counselling psychologistmart, OncologistHeather LCSWA

## 2015-02-14 NOTE — Progress Notes (Signed)
Pt attended NA group this evening.  

## 2015-02-14 NOTE — [facility] Group Notes (Signed)
[facility] LCSW Group Therapy  02/14/2015 3:12 PM  Type of Therapy:  Group Therapy  Participation Level:  Minimal  Participation Quality:  Inattentive  Affect:  Appropriate  Cognitive:  Lacking  Insight:  Limited  Engagement in Therapy:  Limited  Modes of Intervention:  Confrontation, Discussion, Education, Exploration, Problem-solving, Rapport Building, Socialization and Support  Summary of Progress/Problems: Today's Topic: Overcoming Obstacles. Pt identified obstacles faced currently and processed barriers involved in overcoming these obstacles. Pt identified steps necessary for overcoming these obstacles and explored motivation (internal and external) for facing these difficulties head on. Pt further identified one area of concern in their lives and chose a skill of focus pulled from their "toolbox." Diane Foley was inattentive and disengaged during today's processing group. She did not actively participate in group discussion and chose to stay at the craft table to color during group. When engaged by CSW, Diane Foley stated that she is angry that she is leaving on Friday and does not want to return home but could not identify what else she needed to work on while at the hospital. Diane Foley continues to demonstrate limited insight with poor group participation. She is distracting at times (due to side conversations) but is redirectable.    Smart, Diane Foley LCSWA  02/14/2015, 3:12 PM

## 2015-02-14 NOTE — Progress Notes (Signed)
D   Pt is pleasant on approach and cooperative   She continues to endorse some depression and anxiety but does report improvement since hospitalization   She attends and participates in groups and interacts appropriately with other pts A   Verbal support given   Medications administered and effectiveness monitored   Q 15 min checks R    Pt safe at present

## 2015-02-14 NOTE — Progress Notes (Signed)
D: Pt denies SI/HI/AV. Pt is pleasant and cooperative. Pt rates depression at a 6, anxiety at a 2, and Helplessness/hopelessness at a 5.  A: Pt was offered support and encouragement. Pt was given scheduled medications. Pt was encourage to attend groups. Q 15 minute checks were done for safety.  R:Pt attends groups and interacts well with peers and staff. Pt taking medication. Pt has no complaints.Pt receptive to treatment and safety maintained on unit.

## 2015-02-15 MED ORDER — BUPROPION HCL ER (XL) 150 MG PO TB24
150.0000 mg | ORAL_TABLET | Freq: Every day | ORAL | Status: DC
  Administered 2015-02-15 – 2015-02-16 (×2): 150 mg via ORAL
  Filled 2015-02-15 (×2): qty 1
  Filled 2015-02-15: qty 14
  Filled 2015-02-15 (×2): qty 1

## 2015-02-15 NOTE — Progress Notes (Signed)
Adult Psychoeducational Group Note  Date:  02/15/2015 Time:  9:00 AM  Group Topic/Focus:  Morning Wellness Group  Participation Level:  Did Not Attend  Additional Comments: Patient was in the bed resting during this group session.  Harold BarbanByrd, Tyson Parkison E 02/15/2015, 2:22 PM

## 2015-02-15 NOTE — Progress Notes (Signed)
D: Patient presents with depressed affect and mood. She reported that her appetite, sleep and ability to concentrate are poor. Patient rates depression "6-7", feelings of hopelessness "5" and anxiety "7". No participation in groups today. In compliance with the medication regimen in place.  A: Support and encouragement provided to patient. Scheduled medications given per MD orders. Maintain Q15 minute checks for safety.  R: Patient receptive. Denies SI/HI and AVH. Patient remains safe on the hall.

## 2015-02-15 NOTE — [facility] Suicide Risk Assessment (Signed)
[facility] INPATIENT:  Family/Significant Other Suicide Prevention Education  Suicide Prevention Education:  Contact Attempts: Sharene SkeansJeta Debruyne (pt's mother) [phone] has been identified by the patient as the family member/significant other with whom the patient will be residing, and identified as the person(s) who will aid the patient in the event of a mental health crisis.  With written consent from the patient, two attempts were made to provide suicide prevention education, prior to and/or following the patient's discharge.  We were unsuccessful in providing suicide prevention education.  A suicide education pamphlet was given to the patient to share with family/significant other.  Date and time of first attempt: 11:00AM 02/15/15 Date and time of second attempt: 02/15/15 3:40PM (voicemail left requesting call back at earliest convenience).   Smart, Eugena Rhue LCSWA  02/15/2015, 3:43 PM

## 2015-02-15 NOTE — Progress Notes (Signed)
[facility] MD Progress Note  02/15/2015 6:32 PM Diane Foley  MRN:  [id number] Subjective:  Continues to have a hard time. States that she is having a hard time with her mother not being supportive. She is concerned as she is not going to be able to keep the methadone unless she is help by her mother. She states she does not want to dance as dancing is a trigger for her use. She is very upset about the situation. Still wants to drop the dosage on an outpatient basis and made to [facility]. [name] Principal Problem: Mood disorder Diagnosis:   Patient Active Problem List   Diagnosis Date Noted  . MDD (major depressive disorder), severe [F32.2] 02/11/2015  . 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 Mood disorder [F39] 02/09/2015  . UTI (urinary tract infection) [N39.0]    Total Time spent with patient: 30 minutes   Past Medical History:  Past Medical History  Diagnosis Date  . UTI (urinary tract infection)   . Sepsis(995.91)   . Suicidal ideation   . Bipolar 1 disorder     bipolar  . Depression   . Anxiety   . PTSD (post-traumatic stress disorder)   . Drug abuse     Past Surgical History  Procedure Laterality Date  . Appendectomy     Family History:  Family History  Problem Relation Age of Onset  . Cancer Mother    Social History:  History  Alcohol Use  . 0.6 oz/week  . 1 Cans of beer per week    Comment: occasionally     History  Drug Use  . Yes  . Special: Heroin, Cocaine    Comment: methadone, heroin, cocaine    History   Social History  . Marital Status: Single    Spouse Name: N/A  . Number of Children: N/A  . Years of Education: N/A   Social History Main Topics  . Smoking status: Current Every Day Smoker -- 0.25 packs/day    Types: Cigarettes  . Smokeless tobacco: Never Used  . Alcohol Use: 0.6 oz/week    1 Cans of beer per week     Comment: occasionally  . Drug Use: Yes    Special: Heroin, Cocaine     Comment: methadone,  heroin, cocaine  . Sexual Activity: Yes    Birth Control/ Protection: Condom   Other Topics Concern  . None   Social History Narrative   Additional History:    Sleep: Fair  Appetite:  Fair   Assessment:   Musculoskeletal: Strength & Muscle Tone: within normal limits Gait & Station: normal Patient leans: N/A   Psychiatric Specialty Exam: Physical Exam  Review of Systems  Constitutional: Negative.   HENT: Negative.   Eyes: Negative.   Respiratory: Negative.   Cardiovascular: Negative.   Gastrointestinal: Negative.   Genitourinary: Negative.   Musculoskeletal: Negative.   Skin: Negative.   Neurological: Negative.   Endo/Heme/Allergies: Negative.   Psychiatric/Behavioral: Positive for depression and substance abuse. The patient is nervous/anxious and has insomnia.     Blood pressure 119/79, pulse 95, temperature 98.2 F (36.8 C), temperature source Oral, resp. rate 14, height  (1.6 m), weight 57.607 kg (127 lb), last menstrual period 12/11/2014, SpO2 100 %.Body mass index is 22.5 kg/(m^2).  General Appearance: Fairly Groomed  Patent attorney::  Fair  Speech:  Clear and Coherent  Volume:  Normal  Mood:  Anxious and worried  Affect:  anxious worried  Thought Process:  Coherent and Goal Directed  Orientation:  Full (Time, Place, and Person)  Thought Content:   events worries concerns  Suicidal Thoughts:  No  Homicidal Thoughts:  No  Memory:  Immediate;   Fair Recent;   Fair Remote;   Fair  Judgement:  Fair  Insight:  Present  Psychomotor Activity:  Restlessness  Concentration:  Fair  Recall:  FiservFair  Fund of Knowledge:Fair  Language: Fair  Akathisia:  No  Handed:  Right  AIMS (if indicated):     Assets:  Desire for Improvement  ADL's:  Intact  Cognition: WNL  Sleep:  Number of Hours: 6     Current Medications: Current Facility-Administered Medications  Medication Dose Route Frequency Provider Last Rate Last Dose  . acetaminophen (TYLENOL) tablet 650  mg  650 mg Oral Q4H PRN Earney NavyJosephine C Onuoha, NP      . alum & mag hydroxide-simeth (MAALOX/MYLANTA) 200-200-20 MG/5ML suspension 30 mL  30 mL Oral PRN Earney NavyJosephine C Onuoha, NP      . buPROPion (WELLBUTRIN XL) 24 hr tablet 150 mg  150 mg Oral Daily Rachael FeeIrving A Kayo Zion, MD   150 mg at 02/15/15 1204  . hydrOXYzine (ATARAX/VISTARIL) tablet 25 mg  25 mg Oral TID PRN Shuvon B Rankin, NP   25 mg at 02/14/15 2216  . lamoTRIgine (LAMICTAL) tablet 25 mg  25 mg Oral Daily Rachael FeeIrving A Gizzelle Lacomb, MD   25 mg at 02/15/15 0847  . methadone (DOLOPHINE) tablet 55 mg  55 mg Oral Daily Rachael FeeIrving A Shaleena Crusoe, MD   55 mg at 02/15/15 0847  . nicotine (NICODERM CQ - dosed in mg/24 hours) patch 21 mg  21 mg Transdermal Daily Earney NavyJosephine C Onuoha, NP   21 mg at 02/10/15 0858  . traZODone (DESYREL) tablet 100 mg  100 mg Oral QHS,MR X 1 Kerry HoughSpencer E Simon, PA-C   100 mg at 02/14/15 2218    Lab Results: No results found for this or any previous visit (from the past 48 hour(s)).  Physical Findings: AIMS: Facial and Oral Movements Muscles of Facial Expression: None, normal Lips and Perioral Area: None, normal Jaw: None, normal Tongue: None, normal,Extremity Movements Upper (arms, wrists, hands, fingers): None, normal Lower (legs, knees, ankles, toes): None, normal, Trunk Movements Neck, shoulders, hips: None, normal, Overall Severity Severity of abnormal movements (highest score from questions above): None, normal Incapacitation due to abnormal movements: None, normal Patient's awareness of abnormal movements (rate only patient's report): No Awareness, Dental Status Current problems with teeth and/or dentures?: No Does patient usually wear dentures?: No  CIWA:  CIWA-Ar Total: 1 COWS:  COWS Total Score: 1  Treatment Plan Summary: Daily contact with patient to assess and evaluate symptoms and progress in treatment and Medication management Major Depression: we have discussed starting Wellbutrin given her lack of energy, lack of motivation wanting  to sleep all the time. Will continue the Lamictal and assess for any possible mood instability that might come from the antidepressant if there is any bipolarity going on Trial with Wellbutrin XL 150 mg in AM Will work a relapse prevention plan; options to being able to keep the methadone if mother is not willing to help her   Medical Decision Making:  Review of Psycho-Social Stressors (1), Review or order clinical lab tests (1), Review of Medication Regimen & Side Effects (2) and Review of New Medication or Change in Dosage (2)     Ashtan Laton A 02/15/2015, 6:32 PM

## 2015-02-15 NOTE — Progress Notes (Signed)
Pt reports she is doing well, but is having some anxiety about her upcoming discharge.  She is concerned about getting a good night's sleep as she says she has not been able to sleep for the last couple of nights.  Writer discussed options with pt and then spoke with PA about pt's concerns.  Orders were given and pt was informed that if she was not able to get to sleep, then the medication could be repeated.  Pt voiced understanding.  Pt states she has been going to groups and interacting well with peers.  Pt makes her needs known to staff.  Discharge plans are in process.  Support and encouragement offered.  Safety maintained with q15 minute checks.

## 2015-02-16 DIAGNOSIS — F1120 Opioid dependence, uncomplicated: Secondary | ICD-10-CM | POA: Diagnosis present

## 2015-02-16 MED ORDER — METHADONE HCL 10 MG/ML PO CONC: 55.0000 mg | Freq: Every day | ORAL | Status: DC

## 2015-02-16 MED ORDER — TRAZODONE HCL 100 MG PO TABS: 100.0000 mg | ORAL_TABLET | Freq: Every evening | ORAL | Status: DC | PRN

## 2015-02-16 MED ORDER — LAMOTRIGINE 25 MG PO TABS: 25.0000 mg | ORAL_TABLET | Freq: Every day | ORAL | Status: DC

## 2015-02-16 MED ORDER — HYDROXYZINE HCL 25 MG PO TABS: 25.0000 mg | ORAL_TABLET | Freq: Three times a day (TID) | ORAL | Status: DC | PRN

## 2015-02-16 MED ORDER — BUPROPION HCL ER (XL) 150 MG PO TB24: 150.0000 mg | ORAL_TABLET | Freq: Every day | ORAL | Status: DC

## 2015-02-16 NOTE — Progress Notes (Signed)
DISCHARGE NOTE: D: Patient was alert, oriented, in stable condition, and ambulatory with a steady gait upon discharge. Pt denies SI/HI and AVH. A: AVS reviewed and given to pt. Follow up reviewed with pt. Resources reviewed with pt, including [facility]. Prescriptions/Medications given to pt. Belongings returned to pt. Pt given time to ask questions and express concerns. R: Pt D/C'd to mother.   

## 2015-02-16 NOTE — Progress Notes (Signed)
Recreation Therapy Notes  Date: 03.11.2016 Time: 9:30am Location: [address] Hall Group Room   Group Topic: Stress Management  Goal Area(s) Addresses:  Patient will actively participate in stress management techniques presented during session.   Behavioral Response: Did not attend.   Marykay Lexenise L Chenoah Mcnally, LRT/CTRS  Marcianne Ozbun L 02/16/2015 10:22 AM

## 2015-02-16 NOTE — Progress Notes (Signed)
  [facility] :  Will you be returning to the same living situation after discharge:  Yes,  patient plans to return home At discharge, do you have transportation home?: Yes,  patient reports access to transportation by family Do you have the ability to pay for your medications: Yes,  patient will be provided with medication samples and prescriptions at discharge.  Release of information consent forms completed and in the chart;  Patient's signature needed at discharge.  Patient to Follow up at: Follow-up Information    [facility]   Why:  Please follow-up with [facility] at discharge daily for continued methadone treatments (5:30AM-10:00AM). Thank you.    Contact information:   [address] [place], [place] [address] Phone: [phone] Fax: [phone]      [facility]  On 02/21/2015.   Why:  Appt on this date at 2:00PM for therapy with Rudi RummageSharon Burkett. Please bring Photo ID to this appt and make sure to call within 48 hours if you have to cancel or reschedule appt. Thank you.    Contact information:   [facility]  [address] [place], [place] [address] Phone: [phone] Fax: [phone]      Patient denies SI/HI: Yes,  denies    Safety Planning and Suicide Prevention discussed: Yes,  with patient   Have you used any form of tobacco in the last 30 days? (Cigarettes, Smokeless Tobacco, Cigars, and/or Pipes): Yes  Has patient been referred to the Quitline?: Patient refused referral  Diane Foley, [place] CarboKristin L 02/16/2015, 10:39 AM

## 2015-02-16 NOTE — [facility] Suicide Risk Assessment (Signed)
[facility]   Demographic Factors:  Adolescent or young adult and Caucasian  Total Time spent with patient: 30 minutes  Musculoskeletal: Strength & Muscle Tone: within normal limits Gait & Station: normal Patient leans: N/A  Psychiatric Specialty Exam: Physical Exam  Review of Systems  Constitutional: Negative.   HENT: Negative.   Eyes: Negative.   Respiratory: Negative.   Cardiovascular: Negative.   Gastrointestinal: Negative.   Genitourinary: Negative.   Musculoskeletal: Negative.   Skin: Negative.   Neurological: Negative.   Endo/Heme/Allergies: Negative.   Psychiatric/Behavioral: Positive for substance abuse. The patient is nervous/anxious and has insomnia.     Blood pressure 82/51, pulse 83, temperature 98.3 F (36.8 C), temperature source Oral, resp. rate 16, height 5\' 3"  (1.6 m), weight 57.607 kg (127 lb), last menstrual period 12/11/2014, SpO2 100 %.Body mass index is 22.5 kg/(m^2).  General Appearance: Fairly Groomed  Patent attorneyye Contact::  Fair  Speech:  Clear and Coherent409  Volume:  Normal  Mood:  Anxious  Affect:  Appropriate  Thought Process:  Coherent and Goal Directed  Orientation:  Full (Time, Place, and Person)  Thought Content:  plans as she moves on, worries concerns  Suicidal Thoughts:  No  Homicidal Thoughts:  No  Memory:  Immediate;   Fair Recent;   Fair Remote;   Fair  Judgement:  Fair  Insight:  Present  Psychomotor Activity:  Normal  Concentration:  Fair  Recall:  FiservFair  Fund of Knowledge:Fair  Language: Fair  Akathisia:  No  Handed:  Right  AIMS (if indicated):     Assets:  Desire for Improvement  Sleep:  Number of Hours: 3.25  Cognition: WNL  ADL's:  Intact   Have you used any form of tobacco in the last 30 days? (Cigarettes, Smokeless Tobacco, Cigars, and/or Pipes): Yes  Has this patient used any form of tobacco in the last 30 days? (Cigarettes, Smokeless Tobacco, Cigars, and/or Pipes) Yes, A prescription for  an FDA-approved tobacco cessation medication was offered at discharge and the patient refused  Mental Status Per Nursing Assessment::   On Admission:  Suicidal ideation indicated by patient  Current Mental Status by Physician: IN full contact with reality. There are no active S/S of withdrawal. There are no active SI plans or intent She is still not sure what is going to happen with her methadone. She has family members other than her mother who she can turn for help. Ideally would like to stay on it and go down by 5 mg every 2 weeks and [facility]   Loss Factors: Financial problems/change in socioeconomic status  Historical Factors: NA  Risk Reduction Factors:   resourceful  Continued Clinical Symptoms:  Depression:   Comorbid alcohol abuse/dependence Alcohol/Substance Abuse/Dependencies  Cognitive Features That Contribute To Risk:  Closed-mindedness, Polarized thinking and Thought constriction (tunnel vision)    Suicide Risk:  Minimal: No identifiable suicidal ideation.  Patients presenting with no risk factors but with morbid ruminations; may be classified as minimal risk based on the severity of the depressive symptoms  Principal Problem: Mood disorder Discharge Diagnoses:  Patient Active Problem List   Diagnosis Date Noted  . MDD (major depressive disorder), severe [F32.2] 02/11/2015  . 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 Mood disorder [F39] 02/09/2015  . UTI (urinary tract infection) [N39.0]     Follow-up Information    [facility]   Why:  Please  follow-up with [facility] at discharge daily for continued methadone treatments (5:30AM-10:00AM). Thank you.    Contact information:   [address] [place], [place] [address] Phone: [phone] Fax: [phone]      Follow up with [facility]  On 02/21/2015.   Why:  Appt on this date at 2:00PM  for therapy with Rudi Rummage. Please bring Photo ID to this appt and make sure to call within 48 hours if you have to cancel or reschedule appt. Thank you.    Contact information:   [facility]  [address] [place] [address] Phone: [phone] Fax: [phone]      Plan Of Care/Follow-up recommendations:  Activity:  as tolerated Diet:  regular [facility] Is patient on multiple antipsychotic therapies at discharge:  No   Has Patient had three or more failed trials of antipsychotic monotherapy by history:  No  Recommended Plan for Multiple Antipsychotic Therapies: NA    Jannely Henthorn A 02/16/2015, 2:54 PM

## 2015-02-16 NOTE — Progress Notes (Signed)
D: Patient is oriented. Pt remains in bed during assessment and states she hasn't sleep well all night. Pt refuses to take morning medications on time and says she will take them after one more hour of sleep. Pt denies SI/HI and AVH. Pt experiencing hypotension this morning (See docflowsheet-vitals), pt reports she usually has a low blood pressure, pt denies having any symptoms. A: MD, [place] made aware of pt's BP concerns, pt encouraged to increase fluids, will reassess BP frequently. Encouragement/Support provided to pt. Active listening by RN. Scheduled medications administered per providers orders (See MAR). 15 minute checks continued per protocol for patient safety.  R: Patient cooperative and receptive to nursing interventions. Pt remains safe.

## 2015-02-16 NOTE — Progress Notes (Signed)
Patient did attend the evening karaoke group.  

## 2015-02-16 NOTE — [facility] Group Notes (Signed)
[facility] LCSW Aftercare Discharge Planning Group Note  02/16/2015  8:45 AM  Participation Quality: Did Not Attend. Patient invited to participate but declined.  Adalee Kathan, MSW, LCSWA Clinical Social Worker [facility] [phone]   
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
## 2015-02-16 NOTE — Progress Notes (Signed)
Pt has been visible on the unit and observed sitting in the dayroom most of the evening talking with peers or talking on the phone.  She did attend [employer]karaoke tonight.  Pt says her day has been frustrating because her mother will not let her return home.  She is also worried about being able to continue the methadone as she cannot afford it and her mother will probably not help her with the cost.  She said she was given a number to call yesterday to get help with meds, but she stayed in bed most of the morning and did not call.  She wanted Clinical research associatewriter to remind her to call tomorrow as she may be discharged tomorrow.  She is not sure where she will be going.  She denies SI/HI/AV at this time.  She wants to stay sober and live a better life.  "I'm not crazy; I can live a better life."  Pt is hopeful for a good night sleep.  Pt makes her needs known to staff.  Meds given as ordered.  Support and encouragement offered.   Safety maintained with q15 minute checks.

## 2015-02-19 LAB — HEPATITIS PANEL, ACUTE
HCV AB: REACTIVE — AB
Hep A IgM: NONREACTIVE
Hep B C IgM: NONREACTIVE
Hepatitis B Surface Ag: NEGATIVE

## 2015-02-20 NOTE — Progress Notes (Signed)
Patient Discharge Instructions:  After Visit Summary (AVS):   Faxed to:  02/20/15 Discharge Summary Note:   Faxed to:  02/20/15 Psychiatric Admission Assessment Note:   Faxed to:  02/20/15 Suicide Risk Assessment - Discharge Assessment:   Faxed to:  02/20/15 Faxed/Sent to the Next Level Care provider:  02/20/15 Faxed to [facility] @ [phone] Faxed to [facility] @ [phone]  Jerelene ReddenSheena E Markle, 02/20/2015, 3:24 PM

## 2016-08-14 ENCOUNTER — Encounter (HOSPITAL_COMMUNITY): Payer: Self-pay

## 2016-08-14 ENCOUNTER — Emergency Department (HOSPITAL_COMMUNITY)
Admission: EM | Admit: 2016-08-14 | Discharge: 2016-08-14 | Disposition: A | Discharge status: Home / Self Care | Payer: Self-pay | Attending: Emergency Medicine | Admitting: Emergency Medicine

## 2016-08-14 DIAGNOSIS — F1123 Opioid dependence with withdrawal: Secondary | ICD-10-CM | POA: Insufficient documentation

## 2016-08-14 DIAGNOSIS — F1193 Opioid use, unspecified with withdrawal: Secondary | ICD-10-CM

## 2016-08-14 DIAGNOSIS — F319 Bipolar disorder, unspecified: Secondary | ICD-10-CM | POA: Insufficient documentation

## 2016-08-14 DIAGNOSIS — F17210 Nicotine dependence, cigarettes, uncomplicated: Secondary | ICD-10-CM | POA: Insufficient documentation

## 2016-08-14 LAB — COMPREHENSIVE METABOLIC PANEL
ALBUMIN: 3.5 g/dL (ref 3.5–5.0)
ALK PHOS: 59 U/L (ref 38–126)
ALT: 13 U/L — AB (ref 14–54)
ANION GAP: 8 (ref 5–15)
AST: 18 U/L (ref 15–41)
BILIRUBIN TOTAL: 0.6 mg/dL (ref 0.3–1.2)
BUN: 8 mg/dL (ref 6–20)
CALCIUM: 9.3 mg/dL (ref 8.9–10.3)
CO2: 26 mmol/L (ref 22–32)
CREATININE: 0.57 mg/dL (ref 0.44–1.00)
Chloride: 104 mmol/L (ref 101–111)
GFR calc Af Amer: >60 mL/min (ref >60)
GFR calc non Af Amer: >60 mL/min (ref >60)
GLUCOSE: 95 mg/dL (ref 65–99)
Potassium: 3.4 mmol/L — ABNORMAL LOW (ref 3.5–5.1)
Sodium: 138 mmol/L (ref 135–145)
TOTAL PROTEIN: 7.4 g/dL (ref 6.5–8.1)

## 2016-08-14 LAB — CBC
HCT: 34.2 % — ABNORMAL LOW (ref 36.0–46.0)
Hemoglobin: 11.2 g/dL — ABNORMAL LOW (ref 12.0–15.0)
MCH: 27.1 pg (ref 26.0–34.0)
MCHC: 32.7 g/dL (ref 30.0–36.0)
MCV: 82.8 fL (ref 78.0–100.0)
PLATELETS: 325 10*3/uL (ref 150–400)
RBC: 4.13 MIL/uL (ref 3.87–5.11)
RDW: 13.9 % (ref 11.5–15.5)
WBC: 8.4 10*3/uL (ref 4.0–10.5)

## 2016-08-14 LAB — RAPID URINE DRUG SCREEN, HOSP PERFORMED
Amphetamines: NOT DETECTED
Barbiturates: NOT DETECTED
Benzodiazepines: POSITIVE — AB
Cocaine: POSITIVE — AB
OPIATES: POSITIVE — AB
Tetrahydrocannabinol: NOT DETECTED

## 2016-08-14 LAB — ETHANOL: Alcohol, Ethyl (B): <5 mg/dL (ref <5)

## 2016-08-14 MED ORDER — METHOCARBAMOL 500 MG PO TABS: 500.0000 mg | ORAL_TABLET | Freq: Four times a day (QID) | ORAL | 0 refills | Status: DC

## 2016-08-14 MED ORDER — CLONIDINE HCL 0.2 MG PO TABS: 0.2000 mg | ORAL_TABLET | Freq: Three times a day (TID) | ORAL | 0 refills | Status: DC

## 2016-08-14 MED ORDER — KETOROLAC TROMETHAMINE 60 MG/2ML IM SOLN
60.0000 mg | Freq: Once | INTRAMUSCULAR | Status: AC
  Administered 2016-08-14: 60 mg via INTRAMUSCULAR
  Filled 2016-08-14: qty 2

## 2016-08-14 MED ORDER — HYDROXYZINE HCL 25 MG PO TABS: 25.0000 mg | ORAL_TABLET | Freq: Four times a day (QID) | ORAL | 0 refills | Status: DC

## 2016-08-14 MED ORDER — ONDANSETRON 4 MG PO TBDP: 4.0000 mg | ORAL_TABLET | Freq: Three times a day (TID) | ORAL | 0 refills | Status: DC | PRN

## 2016-08-14 MED ORDER — ONDANSETRON 4 MG PO TBDP
4.0000 mg | ORAL_TABLET | Freq: Once | ORAL | Status: AC
  Administered 2016-08-14: 4 mg via ORAL
  Filled 2016-08-14: qty 1

## 2016-08-14 NOTE — ED Provider Notes (Signed)
[facility] DEPT Provider Note   CSN: [id number] Arrival date & time: 08/14/16  0532     History   Chief Complaint Chief Complaint  Patient presents with  . Withdrawal    HPI Diane Foley is a 30 y.o. female.  Patient with 10 year history of heroin abuse presents with complaint of opiate withdrawal. Patient last used heroin yesterday. She states that she is now trying to quit. [facility] Her current complaints are "stretchy skin", muscle aches, restless legs, goosebumps. She denies vomiting or diarrhea at the current time. Patient denies any recent symptoms of illness including fevers, chest pain, cough, abdominal pains. Patient injects through her skin. She denies any warm, red, or swollen areas suggestive of infection or abscess. She denies alcohol or other drug use. No treatments prior to arrival. Patient accompanied by her parents. The onset of this condition was acute. The course is constant. Aggravating factors: none. Alleviating factors: none.        Past Medical History:  Diagnosis Date  . Anxiety   . Bipolar 1 disorder (HCC)    bipolar  . Depression   . Drug abuse   . PTSD (post-traumatic stress disorder)   . Sepsis(995.91)   . Suicidal ideation   . UTI (urinary tract infection)     Patient Active Problem List   Diagnosis Date Noted  . Opioid type dependence, continuous (HCC) 02/16/2015  . MDD (major depressive disorder), severe (HCC) 02/11/2015  . 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 Mood disorder (HCC) 02/09/2015  . UTI (urinary tract infection)     Past Surgical History:  Procedure Laterality Date  . APPENDECTOMY      OB History    No data available       Home Medications    Prior to Admission medications   Medication Sig Start Date End Date Taking? Authorizing Provider  buPROPion (WELLBUTRIN XL) 150 MG 24 hr tablet Take 1 tablet (150 mg total) by mouth daily. 02/16/15   Thermon LeylandLaura A Davis, NP    hydrOXYzine (ATARAX/VISTARIL) 25 MG tablet Take 1 tablet (25 mg total) by mouth 3 (three) times daily as needed for anxiety. 02/16/15   Thermon LeylandLaura A Davis, NP  lamoTRIgine (LAMICTAL) 25 MG tablet Take 1 tablet (25 mg total) by mouth daily. 02/16/15   Thermon LeylandLaura A Davis, NP  methadone (DOLOPHINE) 10 MG/ML solution Take 5.5 mLs (55 mg total) by mouth daily. Scheduled. Patient stated that she goes to the methadone pain clinic. 02/16/15   Thermon LeylandLaura A Davis, NP  traZODone (DESYREL) 100 MG tablet Take 1 tablet (100 mg total) by mouth at bedtime and may repeat dose one time if needed. 02/16/15   Thermon LeylandLaura A Davis, NP    Family History Family History  Problem Relation Age of Onset  . Cancer Mother     Social History Social History  Substance Use Topics  . Smoking status: Current Every Day Smoker    Packs/day: 0.25    Types: Cigarettes  . Smokeless tobacco: Never Used  . Alcohol use 0.6 oz/week    1 Cans of beer per week     Comment: occasionally     Allergies   Review of patient's allergies indicates no known allergies.   Review of Systems Review of Systems  Constitutional: Positive for fever.  HENT: Negative for rhinorrhea and sore throat.   Eyes: Negative for redness.  Respiratory: Negative for cough.   Cardiovascular: Negative for chest pain.  Gastrointestinal: Positive for nausea. Negative for abdominal pain, diarrhea and vomiting.  Genitourinary: Negative for dysuria.  Musculoskeletal: Positive for myalgias.  Skin: Negative for rash.       +piloerection  Neurological: Negative for headaches.  Psychiatric/Behavioral: The patient is nervous/anxious.      Physical Exam Updated Vital Signs BP 111/75   Pulse 80   Temp 97.7 F (36.5 C) (Oral)   Resp 10   Ht 5\' 3"  (1.6 m)   Wt 49.9 kg   SpO2 97%   BMI 19.49 kg/m   Physical Exam  Constitutional: She is oriented to person, place, and time. She appears well-developed and well-nourished.  HENT:  Head: Normocephalic and atraumatic.   Mouth/Throat: Oropharynx is clear and moist.  Eyes: Conjunctivae are normal. Right eye exhibits no discharge. Left eye exhibits no discharge.  Neck: Normal range of motion. Neck supple.  Cardiovascular: Normal rate, regular rhythm and normal heart sounds.   No murmur heard. Pulmonary/Chest: Effort normal and breath sounds normal. No respiratory distress. She has no wheezes. She has no rales.  Abdominal: Soft. She exhibits no mass. There is no tenderness. There is no guarding.  Musculoskeletal: She exhibits no edema.  Neurological: She is alert and oriented to person, place, and time. No cranial nerve deficit. She exhibits normal muscle tone.  Patient 'drifts off' during interview but is easily awoken with voice.   Skin: Skin is warm and dry.  Multiple track marks on bilateral arms, none of which appear to be infected.   Psychiatric: She has a normal mood and affect.  Nursing note and vitals reviewed.    ED Treatments / Results  Labs (all labs ordered are listed, but only abnormal results are displayed) Labs Reviewed  COMPREHENSIVE METABOLIC PANEL - Abnormal; Notable for the following:       Result Value   Potassium 3.4 (*)    ALT 13 (*)    All other components within normal limits  CBC - Abnormal; Notable for the following:    Hemoglobin 11.2 (*)    HCT 34.2 (*)    All other components within normal limits  URINE RAPID DRUG SCREEN, HOSP PERFORMED - Abnormal; Notable for the following:    Opiates POSITIVE (*)    Cocaine POSITIVE (*)    Benzodiazepines POSITIVE (*)    All other components within normal limits  ETHANOL    Procedures Procedures (including critical care time)  Medications Ordered in ED Medications - No data to display   Initial Impression / Assessment and Plan / ED Course  I have reviewed the triage vital signs and the nursing notes.  Pertinent labs & imaging results that were available during my care of the patient were reviewed by me and considered in  my medical decision making (see chart for details).  Clinical Course   Patient seen and examined. Awaiting labs. Main complaint right now are muscle pains. Will treat with Toradol and zofran. Will need medications for home. She has plans to go to [facility]. We discussed typical supportive treatments for opiate withdrawals. She is very sleepy.   Vital signs reviewed and are as follows: BP 111/75   Pulse 80   Temp 97.7 F (36.5 C) (Oral)   Resp 10   Ht 5\' 3"  (1.6 m)   Wt 49.9 kg   SpO2 97%   BMI 19.49 kg/m   7:39 AM Patient sleeping. Will ambulate prior to discharge. UDS does show cocaine and benzos. Father thinks she had been doing meth.  Parents at bedside and we discussed lab results.   When safe, she will be discharged with symptomatic therapy and outpatient referrals. Hopefully the patient will decide to follow through with her plan to go to [facility] for treatment.   9:27 AM Patient has ambulated without difficulty. I discussed plan with patient and father at bedside. When told that she would be going home with referrals, she begins to cry and yell. She states that she was placed before when she came to ED. I apologized and explained that placement from the ED is unfortunately no longer an option. Discussed discharge with medications to control her symptoms and for several inpatient treatment plans she and her family may contact. She states that she's not going to go. Will allow her to calm down and discuss again.   9:46 AM Mother now at bedside. We reviewed medication and referrals for appropriate treatment programs. They are comfortable with discharge to home. Nurse at bedside to explain discharge instructions.  Final Clinical Impressions(s) / ED Diagnoses   Final diagnoses:  Opiate withdrawal (HCC)   Patient with opiate withdrawal symptoms. Labs are reassuring. Treatment and outpatient follow-up plan as above. Patient's parents will help facilitate follow-up. No concern for alcohol or  significant benzodiazepine withdrawal at this time per patient's history.   New Prescriptions New Prescriptions   CLONIDINE (CATAPRES) 0.2 MG TABLET    Take 1 tablet (0.2 mg total) by mouth 3 (three) times daily.   HYDROXYZINE (ATARAX/VISTARIL) 25 MG TABLET    Take 1 tablet (25 mg total) by mouth every 6 (six) hours.   METHOCARBAMOL (ROBAXIN) 500 MG TABLET    Take 1 tablet (500 mg total) by mouth 4 (four) times daily.   ONDANSETRON (ZOFRAN ODT) 4 MG DISINTEGRATING TABLET    Take 1 tablet (4 mg total) by mouth every 8 (eight) hours as needed for nausea or vomiting.     Renne Crigler, PA-C 08/14/16 1610    Shon Baton, MD 08/15/16 [phone]

## 2016-08-14 NOTE — Discharge Instructions (Signed)
Please read and follow all provided instructions.  Your diagnoses today include:  1. Opiate withdrawal (HCC)     Tests performed today include:  Blood counts and electrolytes  Vital signs. See below for your results today.   Medications prescribed:   Zofran (ondansetron) - for nausea and vomiting   Robaxin (methocarbamol) - muscle relaxer medication  DO NOT drive or perform any activities that require you to be awake and alert because this medicine can make you drowsy.    Hydroxyzine - antihistamine  You can find this medication over-the-counter.   This medication will make you drowsy. DO NOT drive or perform any activities that require you to be awake and alert if taking this.   Clonidine - medication to help opioid withdrawal symptoms  Do not take if you feel lightheaded tor dizzy.   Take any prescribed medications only as directed.  Home care instructions:  Follow any educational materials contained in this packet.  Follow-up instructions: Please follow-up with your primary care provider in the next 3 days for further evaluation of your symptoms.   Follow-up with [facility] or the referrals given.   Return instructions:   Please return to the Emergency Department if you experience worsening symptoms.   Please return if you have any other emergent concerns.  Additional Information:  Your vital signs today were: BP 102/71    Pulse 68    Temp 97.7 F (36.5 C) (Oral)    Resp 24    Ht 5\' 3"  (1.6 m)    Wt 49.9 kg    SpO2 98%    BMI 19.49 kg/m  If your blood pressure (BP) was elevated above 135/85 this visit, please have this repeated by your doctor within one month. --------------

## 2016-08-14 NOTE — ED Notes (Signed)
Pt was given discharge instructions and reviewed prescriptions and follow up information with pt and pt's mother. Pt will not allow another BP reading or HR and oxygen reading.

## 2016-08-14 NOTE — ED Triage Notes (Signed)
Pt states that she is withdrawing from heroin, last used yesterday, pt states she used "a lot" , pt states that "she is tired of the game and cant keep going threw this" Pt denies SI/HI/AVH.

## 2016-08-14 NOTE — ED Notes (Signed)
Myself and Leeroy BockChelsea, RN ambulated patient from room to nurses' station and back without any difficulty or distress; parents at bedside

## 2017-07-31 ENCOUNTER — Emergency Department (HOSPITAL_COMMUNITY): Payer: Self-pay

## 2017-07-31 ENCOUNTER — Observation Stay (HOSPITAL_COMMUNITY)
Admission: EM | Admit: 2017-07-31 | Discharge: 2017-08-01 | Discharge status: Against Medical Advice | Payer: Self-pay | Attending: Internal Medicine | Admitting: Internal Medicine

## 2017-07-31 DIAGNOSIS — R410 Disorientation, unspecified: Secondary | ICD-10-CM

## 2017-07-31 DIAGNOSIS — D649 Anemia, unspecified: Secondary | ICD-10-CM | POA: Insufficient documentation

## 2017-07-31 DIAGNOSIS — Z5321 Procedure and treatment not carried out due to patient leaving prior to being seen by health care provider: Secondary | ICD-10-CM | POA: Insufficient documentation

## 2017-07-31 DIAGNOSIS — T405X1A Poisoning by cocaine, accidental (unintentional), initial encounter: Principal | ICD-10-CM | POA: Insufficient documentation

## 2017-07-31 DIAGNOSIS — X58XXXA Exposure to other specified factors, initial encounter: Secondary | ICD-10-CM | POA: Insufficient documentation

## 2017-07-31 DIAGNOSIS — E876 Hypokalemia: Secondary | ICD-10-CM | POA: Diagnosis present

## 2017-07-31 DIAGNOSIS — F19921 Other psychoactive substance use, unspecified with intoxication with delirium: Secondary | ICD-10-CM

## 2017-07-31 LAB — COMPREHENSIVE METABOLIC PANEL
ALK PHOS: 55 U/L (ref 38–126)
ALT: 21 U/L (ref 14–54)
ANION GAP: 7 (ref 5–15)
AST: 24 U/L (ref 15–41)
Albumin: 3.1 g/dL — ABNORMAL LOW (ref 3.5–5.0)
BILIRUBIN TOTAL: 0.3 mg/dL (ref 0.3–1.2)
BUN: 8 mg/dL (ref 6–20)
CALCIUM: 8.6 mg/dL — AB (ref 8.9–10.3)
CO2: 28 mmol/L (ref 22–32)
Chloride: 106 mmol/L (ref 101–111)
Creatinine, Ser: 0.81 mg/dL (ref 0.44–1.00)
Glucose, Bld: 127 mg/dL — ABNORMAL HIGH (ref 65–99)
POTASSIUM: 3.4 mmol/L — AB (ref 3.5–5.1)
Sodium: 141 mmol/L (ref 135–145)
TOTAL PROTEIN: 6.1 g/dL — AB (ref 6.5–8.1)

## 2017-07-31 LAB — URINALYSIS, ROUTINE W REFLEX MICROSCOPIC
Bilirubin Urine: NEGATIVE
Glucose, UA: NEGATIVE mg/dL
Hgb urine dipstick: NEGATIVE
KETONES UR: NEGATIVE mg/dL
LEUKOCYTES UA: NEGATIVE
NITRITE: NEGATIVE
PROTEIN: NEGATIVE mg/dL
Specific Gravity, Urine: 1.005 (ref 1.005–1.030)
pH: 6 (ref 5.0–8.0)

## 2017-07-31 LAB — RAPID URINE DRUG SCREEN, HOSP PERFORMED
AMPHETAMINES: POSITIVE — AB
BENZODIAZEPINES: NOT DETECTED
Barbiturates: NOT DETECTED
Cocaine: POSITIVE — AB
OPIATES: POSITIVE — AB
Tetrahydrocannabinol: NOT DETECTED

## 2017-07-31 LAB — CK: Total CK: 48 U/L (ref 38–234)

## 2017-07-31 LAB — I-STAT BETA HCG BLOOD, ED (MC, WL, AP ONLY)

## 2017-07-31 LAB — CBC WITH DIFFERENTIAL/PLATELET
BASOS ABS: 0 10*3/uL (ref 0.0–0.1)
BASOS PCT: 0 %
Eosinophils Absolute: 0.1 10*3/uL (ref 0.0–0.7)
Eosinophils Relative: 2 %
HEMATOCRIT: 31.4 % — AB (ref 36.0–46.0)
Hemoglobin: 10.3 g/dL — ABNORMAL LOW (ref 12.0–15.0)
LYMPHS PCT: 16 %
Lymphs Abs: 0.8 10*3/uL (ref 0.7–4.0)
MCH: 28.1 pg (ref 26.0–34.0)
MCHC: 32.8 g/dL (ref 30.0–36.0)
MCV: 85.8 fL (ref 78.0–100.0)
MONO ABS: 0.3 10*3/uL (ref 0.1–1.0)
Monocytes Relative: 5 %
NEUTROS ABS: 4.1 10*3/uL (ref 1.7–7.7)
Neutrophils Relative %: 77 %
PLATELETS: 198 10*3/uL (ref 150–400)
RBC: 3.66 MIL/uL — AB (ref 3.87–5.11)
RDW: 14.2 % (ref 11.5–15.5)
WBC: 5.3 10*3/uL (ref 4.0–10.5)

## 2017-07-31 LAB — I-STAT CG4 LACTIC ACID, ED
LACTIC ACID, VENOUS: 0.75 mmol/L (ref 0.5–1.9)
Lactic Acid, Venous: 0.76 mmol/L (ref 0.5–1.9)

## 2017-07-31 LAB — ACETAMINOPHEN LEVEL

## 2017-07-31 LAB — I-STAT TROPONIN, ED: TROPONIN I, POC: 0 ng/mL (ref 0.00–0.08)

## 2017-07-31 LAB — ETHANOL

## 2017-07-31 LAB — SALICYLATE LEVEL

## 2017-07-31 MED ORDER — LORAZEPAM 2 MG/ML IJ SOLN
INTRAMUSCULAR | Status: AC
  Filled 2017-07-31: qty 1

## 2017-07-31 MED ORDER — ACETAMINOPHEN 650 MG RE SUPP: 650.0000 mg | Freq: Four times a day (QID) | RECTAL | Status: DC | PRN

## 2017-07-31 MED ORDER — ONDANSETRON HCL 4 MG/2ML IJ SOLN: 4.0000 mg | Freq: Four times a day (QID) | INTRAMUSCULAR | Status: DC | PRN

## 2017-07-31 MED ORDER — ONDANSETRON HCL 4 MG PO TABS: 4.0000 mg | ORAL_TABLET | Freq: Four times a day (QID) | ORAL | Status: DC | PRN

## 2017-07-31 MED ORDER — VITAMIN B-1 100 MG PO TABS
100.0000 mg | ORAL_TABLET | Freq: Every day | ORAL | Status: DC
  Administered 2017-07-31: 100 mg via ORAL
  Filled 2017-07-31: qty 1

## 2017-07-31 MED ORDER — SODIUM CHLORIDE 0.9 % IV BOLUS (SEPSIS)
1000.0000 mL | Freq: Once | INTRAVENOUS | Status: AC
  Administered 2017-07-31: 1000 mL via INTRAVENOUS

## 2017-07-31 MED ORDER — ADULT MULTIVITAMIN W/MINERALS CH
1.0000 | ORAL_TABLET | Freq: Every day | ORAL | Status: DC
  Administered 2017-07-31: 1 via ORAL
  Filled 2017-07-31: qty 1

## 2017-07-31 MED ORDER — ACETAMINOPHEN 325 MG PO TABS
650.0000 mg | ORAL_TABLET | Freq: Four times a day (QID) | ORAL | Status: DC | PRN
Start: 2017-07-31 — End: 2017-08-01

## 2017-07-31 MED ORDER — SODIUM CHLORIDE 0.9% FLUSH
3.0000 mL | Freq: Two times a day (BID) | INTRAVENOUS | Status: DC
  Administered 2017-08-01: 3 mL via INTRAVENOUS

## 2017-07-31 MED ORDER — KETOROLAC TROMETHAMINE 30 MG/ML IJ SOLN
30.0000 mg | Freq: Four times a day (QID) | INTRAMUSCULAR | Status: DC | PRN
  Filled 2017-07-31: qty 1

## 2017-07-31 MED ORDER — POTASSIUM CHLORIDE IN NACL 20-0.9 MEQ/L-% IV SOLN
INTRAVENOUS | Status: DC
  Administered 2017-07-31: 21:00:00 via INTRAVENOUS
  Filled 2017-07-31 (×2): qty 1000

## 2017-07-31 MED ORDER — IBUPROFEN 400 MG PO TABS: 400.0000 mg | ORAL_TABLET | Freq: Four times a day (QID) | ORAL | Status: DC | PRN

## 2017-07-31 MED ORDER — FOLIC ACID 1 MG PO TABS
1.0000 mg | ORAL_TABLET | Freq: Every day | ORAL | Status: DC
  Administered 2017-07-31: 1 mg via ORAL
  Filled 2017-07-31: qty 1

## 2017-07-31 MED ORDER — ENOXAPARIN SODIUM 40 MG/0.4ML ~~LOC~~ SOLN
40.0000 mg | SUBCUTANEOUS | Status: DC
  Administered 2017-07-31: 40 mg via SUBCUTANEOUS
  Filled 2017-07-31: qty 0.4

## 2017-07-31 MED ORDER — LORAZEPAM 2 MG/ML IJ SOLN
2.0000 mg | Freq: Once | INTRAMUSCULAR | Status: AC
  Administered 2017-07-31: 2 mg via INTRAVENOUS

## 2017-07-31 NOTE — ED Notes (Signed)
Pt BP 89/60; starting NS bolus; will continue to assess.

## 2017-07-31 NOTE — ED Triage Notes (Signed)
Pt arrived in POV AMS, per driver of care pt "used pills".  Pt alert, withdraws from pain, nonverbal, combative, warm to touch. Diane Gage, PA, at bedside.

## 2017-07-31 NOTE — ED Notes (Signed)
Phleb at bedside drawing second set of cultures and istats.

## 2017-07-31 NOTE — ED Notes (Signed)
Pt urinated over bed. Pt linens and gown changed. Purewick catheter placed. Pt opening eyes; not following commands and not speaking at this time.

## 2017-07-31 NOTE — ED Provider Notes (Signed)
[facility] Provider Note   CSN: [id number] Arrival date & time: 07/31/17  1033     History   Chief Complaint Chief Complaint  Patient presents with  . Altered Mental Status    HPI Diane Foley is a 31 y.o. female.  Patient presents with altered LOC, suspected overdose. She is not responsive, agitated. + track marks. Level V caveat 2/2 altered LOC.       No past medical history on file.  There are no active problems to display for this patient.   No past surgical history on file.  OB History    No data available       Home Medications    Prior to Admission medications   Not on File    Family History No family history on file.  Social History Social History  Substance Use Topics  . Smoking status: Not on file  . Smokeless tobacco: Not on file  . Alcohol use Not on file     Allergies   Patient has no known allergies.   Review of Systems Review of Systems  Unable to perform ROS: Mental status change     Physical Exam Updated Vital Signs There were no vitals taken for this visit.  Physical Exam  Constitutional: She appears well-developed and well-nourished.  HENT:  Head: Normocephalic and atraumatic.  Nose: Nose normal.  Oral mucosa very dry  Eyes: Conjunctivae are normal. Right eye exhibits no discharge. Left eye exhibits no discharge.  Dilated pupils  Neck: Normal range of motion. Neck supple.  Cardiovascular: Regular rhythm and normal heart sounds.  Tachycardia present.   Pulmonary/Chest: Effort normal and breath sounds normal. Tachypnea noted. No respiratory distress.  Abdominal: Soft. There is no tenderness.  Neurological: She is alert.  Skin: Skin is warm and dry.  Patient with track marks bilateral upper extremities. Tattoo of chemical symbol of heroin noted L arm. No cellulitis of abscess noted on complete body exam. Several mild lesions lower extremities appearing similar to bug bites.   Psychiatric: She has a normal  mood and affect.  Nursing note and vitals reviewed.    ED Treatments / Results  Labs (all labs ordered are listed, but only abnormal results are displayed) Labs Reviewed  COMPREHENSIVE METABOLIC PANEL - Abnormal; Notable for the following:       Result Value   Potassium 3.4 (*)    Glucose, Bld 127 (*)    Calcium 8.6 (*)    Total Protein 6.1 (*)    Albumin 3.1 (*)    All other components within normal limits  CBC WITH DIFFERENTIAL/PLATELET - Abnormal; Notable for the following:    RBC 3.66 (*)    Hemoglobin 10.3 (*)    HCT 31.4 (*)    All other components within normal limits  RAPID URINE DRUG SCREEN, HOSP PERFORMED - Abnormal; Notable for the following:    Opiates POSITIVE (*)    Cocaine POSITIVE (*)    Amphetamines POSITIVE (*)    All other components within normal limits  ACETAMINOPHEN LEVEL - Abnormal; Notable for the following:    Acetaminophen (Tylenol), Serum <10 (*)    All other components within normal limits  CULTURE, BLOOD (ROUTINE X 2)  CULTURE, BLOOD (ROUTINE X 2)  CK  URINALYSIS, ROUTINE W REFLEX MICROSCOPIC  SALICYLATE LEVEL  ETHANOL  I-STAT CG4 LACTIC ACID, ED  I-STAT TROPONIN, ED  I-STAT BETA HCG BLOOD, ED ([facility], [facility], [facility] ONLY)  I-STAT CG4 LACTIC ACID, ED    EKG  EKG Interpretation  Date/Time:  Friday July 31 2017 10:54:43 EDT Ventricular Rate:  145 PR Interval:    QRS Duration: 71 QT Interval:  289 QTC Calculation: 449 R Axis:   85 Text Interpretation:  Sinus tachycardia Borderline right axis deviation No old tracing to compare Confirmed by Marily Memos [phone]) on 07/31/2017 11:01:01 AM       Radiology Ct Head Wo Contrast  Result Date: 07/31/2017 CLINICAL DATA:  Altered level of consciousness. EXAM: CT HEAD WITHOUT CONTRAST TECHNIQUE: Contiguous axial images were obtained from the base of the skull through the vertex without intravenous contrast. COMPARISON:  None. FINDINGS: Brain: No evidence of acute infarction, hemorrhage, hydrocephalus,  extra-axial collection or mass lesion/mass effect. Vascular: No hyperdense vessel or unexpected calcification. Skull: Normal. Negative for fracture or focal lesion. Sinuses/Orbits: Mild bilateral ethmoid and right maxillary sinusitis is noted. Other: None. IMPRESSION: Normal head CT. Electronically Signed   By: Lupita Raider, M.D.   On: 07/31/2017 13:05   Dg Chest Port 1 View  Result Date: 07/31/2017 CLINICAL DATA:  Unresponsive. EXAM: PORTABLE CHEST 1 VIEW COMPARISON:  None. FINDINGS: The cardiomediastinal silhouette is normal in size. Normal pulmonary vascularity. No focal consolidation, pleural effusion, or pneumothorax. No acute osseous abnormality. IMPRESSION: No active cardiopulmonary disease. Electronically Signed   By: Obie Dredge M.D.   On: 07/31/2017 11:52    Procedures Procedures (including critical care time)  Medications Ordered in ED Medications  LORazepam (ATIVAN) injection 2 mg (2 mg Intravenous Given 07/31/17 1047)  sodium chloride 0.9 % bolus 1,000 mL (0 mLs Intravenous Stopped 07/31/17 1242)  sodium chloride 0.9 % bolus 1,000 mL (0 mLs Intravenous Stopped 07/31/17 1443)     Initial Impression / Assessment and Plan / ED Course  I have reviewed the triage vital signs and the nursing notes.  Pertinent labs & imaging results that were available during my care of the patient were reviewed by me and considered in my medical decision making (see chart for details).     10:40 AM Called to patient room on arrival. She is combative and agitated. Suspected overdose but no history other than she was found in a hotel room. Pupils dilated, dry mucous membranes, tachycardic, dry appearing, + incontinence. She required 4 people to hold her still for IV start. She was given 2mg  IV ativan with improvement in agitation. O2 sat borderline. Rectal temp 100.81F. Seen with Dr. Clayborne Dana. Work-up ordered.  Vital signs reviewed and are as follows: Ht 5\' 2"  (1.575 m)   Wt 49.9 kg (110 lb)   BMI  20.12 kg/m   11:45 AM Pt rechecked several times. She is stable, sleeping. No airway concerns at present.   11:48 AM Bladder scan with 250cc.   12:37 PM Pt resting comfortably.    4:13 PM Patient unchanged during ED stay. Remains sleepy. Grimaces to pain.   Will admit to hospitalist. Repeat temp 97.81F.    CRITICAL CARE Performed by: Carolee Rota Total critical care time: 50 minutes Critical care time was exclusive of separately billable procedures and treating other patients. Critical care was necessary to treat or prevent imminent or life-threatening deterioration. Critical care was time spent personally by me on the following activities: development of treatment plan with patient and/or surrogate as well as nursing, discussions with consultants, evaluation of patient's response to treatment, examination of patient, obtaining history from patient or surrogate, ordering and performing treatments and interventions, ordering and review of laboratory studies, ordering and review of radiographic studies, pulse oximetry  and re-evaluation of patient's condition.    Final Clinical Impressions(s) / ED Diagnoses   Final diagnoses:  Delirium  Intoxication by drug, with delirium (HCC)   Admit.   New Prescriptions New Prescriptions   No medications on file     Desmond Dike 07/31/17 1809    Mesner, Barbara Cower, MD 08/02/17 [phone]

## 2017-07-31 NOTE — Progress Notes (Addendum)
Pt requesting food and/or drink. Educated that she is NPO. Page to on-call M. Lynch. New order for Heart healthy diet placed. Dondra Spry, RN

## 2017-07-31 NOTE — Progress Notes (Signed)
Pt has awakened. Pt is confused as to how she got here but reports that she was "floating and could not get out of it". Pt's family is at the bedside. Will continue to monitor closely. Dondra Spry, RN

## 2017-07-31 NOTE — Progress Notes (Addendum)
Pt now bursting into tears while moaning loudly and rocking back and forth. Denies pain. Page to [facility]. Lynch for notification. Dondra Spry, RN

## 2017-07-31 NOTE — ED Notes (Signed)
ED Provider at bedside. 

## 2017-07-31 NOTE — H&P (Addendum)
History and Physical    Diane Foley WRU:[id number] DOB: August 23, 1986 DOA: 07/31/2017  PCP: Patient, No Pcp Per  Patient coming from: Dropped off in a car; came from a hotel    Chief Complaint: Unresponsive  HPI: Diane Foley is a 31 y.o. female with medical history significant for no known medical problems. Review of care everywhere reveals that the patient was [facility] department in September 2017 and had a positive drug screen for opiates and benzodiazepines. That's the only prior history we have on the patient. She presented agitated. She was found in a hotel room and dropped off by an unknown person. He was nonverbal. She has track marks up her arms and a tattoo for the chemical symbol of her Cyndie Chime on her left forearm. She does respond to pain. She initially had an elevated rectal temperature 100.5 it is now normal. There is no further information available on this patient. Is no one to give any history. All the history I have is been given to me from the ER physician.   ED Course: Examination consistent with cocaine overdose. Patient did not have pinpoint pupils are respiratory depression therefore Narcan was not given. She was given some Ativan. A combination of amphetamines, opiates, and cocaine were found in her urine. She was referred to me for further evaluation and management  Review of Systems: Review of systems could not be performed as the patient is unresponsive   No past medical history on file.  No past surgical history on file.   has no tobacco, alcohol, and drug history on file.  Allergies no known allergies  No family history on file. Patient is unresponsive and unable to provide family history  Prior to Admission medications   Not on File    Physical Exam: Vitals:   07/31/17 1515 07/31/17 1545 07/31/17 1600 07/31/17 1615  BP: (!) 98/59 99/65 103/67 103/61  Pulse: 83 86 91 85  Resp: 11 16 11 14   Temp:      TempSrc:      SpO2:  98% 98% 98% 100%  Weight:      Height:       .TCS Constitutional: Opens her eyes occasionally with stimulation does not speak does not follow commands Vitals:   07/31/17 1515 07/31/17 1545 07/31/17 1600 07/31/17 1615  BP: (!) 98/59 99/65 103/67 103/61  Pulse: 83 86 91 85  Resp: 11 16 11 14   Temp:      TempSrc:      SpO2: 98% 98% 98% 100%  Weight:      Height:       Eyes: PERRL, lids and conjunctivae normal ENMT: Mucous membranes are Dry Posterior pharynx clear of any exudate or lesions.Normal dentition.  Neck: normal, supple, no masses, no thyromegaly Respiratory: clear to auscultation bilaterally, no wheezing, no crackles. Normal respiratory effort. No accessory muscle use.  Cardiovascular: Tachycardic rate and rhythm, no murmurs / rubs / gallops. No extremity edema. 2+ pedal pulses. No carotid bruits.  Abdomen: no tenderness, no masses palpated. No hepatosplenomegaly. Bowel sounds positive.  Musculoskeletal: no clubbing / cyanosis. No joint deformity upper and lower extremities. Good ROM, no contractures. Normal muscle tone.  Skin: no rashes, lesions, ulcers. No induration. Left forearm with track marks and chemical symbol for heroin tattooed on her arm Neurologic: Moves all extremities with stimulation opens eyes does not follow commands  Psychiatric: Unresponsive    Labs on Admission: I have personally reviewed following labs and imaging studies  CBC:  Recent Labs Lab 07/31/17 1058  WBC 5.3  NEUTROABS 4.1  HGB 10.3*  HCT 31.4*  MCV 85.8  PLT 198   Basic Metabolic Panel:  Recent Labs Lab 07/31/17 1058  NA 141  K 3.4*  CL 106  CO2 28  GLUCOSE 127*  BUN 8  CREATININE 0.81  CALCIUM 8.6*   GFR: Estimated Creatinine Clearance: 80 mL/min (by C-G formula based on SCr of 0.81 mg/dL). Liver Function Tests:  Recent Labs Lab 07/31/17 1058  AST 24  ALT 21  ALKPHOS 55  BILITOT 0.3  PROT 6.1*  ALBUMIN 3.1*   No results for input(s): LIPASE, AMYLASE in the  last 168 hours. No results for input(s): AMMONIA in the last 168 hours. Coagulation Profile: No results for input(s): INR, PROTIME in the last 168 hours. Cardiac Enzymes:  Recent Labs Lab 07/31/17 1058  CKTOTAL 48   BNP (last 3 results) No results for input(s): PROBNP in the last 8760 hours. HbA1C: No results for input(s): HGBA1C in the last 72 hours. CBG: No results for input(s): GLUCAP in the last 168 hours. Lipid Profile: No results for input(s): CHOL, HDL, LDLCALC, TRIG, CHOLHDL, LDLDIRECT in the last 72 hours. Thyroid Function Tests: No results for input(s): TSH, T4TOTAL, FREET4, T3FREE, THYROIDAB in the last 72 hours. Anemia Panel: No results for input(s): VITAMINB12, FOLATE, FERRITIN, TIBC, IRON, RETICCTPCT in the last 72 hours. Urine analysis:    Component Value Date/Time   COLORURINE YELLOW 07/31/2017 1352   APPEARANCEUR CLEAR 07/31/2017 1352   LABSPEC 1.005 07/31/2017 1352   PHURINE 6.0 07/31/2017 1352   GLUCOSEU NEGATIVE 07/31/2017 1352   HGBUR NEGATIVE 07/31/2017 1352   BILIRUBINUR NEGATIVE 07/31/2017 1352   KETONESUR NEGATIVE 07/31/2017 1352   PROTEINUR NEGATIVE 07/31/2017 1352   NITRITE NEGATIVE 07/31/2017 1352   LEUKOCYTESUR NEGATIVE 07/31/2017 1352    Radiological Exams on Admission: Ct Head Wo Contrast  Result Date: 07/31/2017 CLINICAL DATA:  Altered level of consciousness. EXAM: CT HEAD WITHOUT CONTRAST TECHNIQUE: Contiguous axial images were obtained from the base of the skull through the vertex without intravenous contrast. COMPARISON:  None. FINDINGS: Brain: No evidence of acute infarction, hemorrhage, hydrocephalus, extra-axial collection or mass lesion/mass effect. Vascular: No hyperdense vessel or unexpected calcification. Skull: Normal. Negative for fracture or focal lesion. Sinuses/Orbits: Mild bilateral ethmoid and right maxillary sinusitis is noted. Other: None. IMPRESSION: Normal head CT. Electronically Signed   By: Lupita Raider, M.D.   On:  07/31/2017 13:05   Dg Chest Port 1 View  Result Date: 07/31/2017 CLINICAL DATA:  Unresponsive. EXAM: PORTABLE CHEST 1 VIEW COMPARISON:  None. FINDINGS: The cardiomediastinal silhouette is normal in size. Normal pulmonary vascularity. No focal consolidation, pleural effusion, or pneumothorax. No acute osseous abnormality. IMPRESSION: No active cardiopulmonary disease. Electronically Signed   By: Obie Dredge M.D.   On: 07/31/2017 11:52    EKG: Independently reviewed. Sinus tachycardia  Assessment/Plan Active Problems:   Cocaine overdose, accidental or unintentional, initial encounter   Hypokalemia   Normochromic normocytic anemia    1. Cocaine overdose, accidental or unintentional. Will place the patient in observation in the hospital and monitor her closely. At this point I do not see the need for any additional medications. Will monitor her neurological exam. Hopefully she will wake up slowly and without any sequelae.  2. Hypokalemia this will be repleted in her IV fluids over the next 24 hours.  3. Normocytic normochromic anemia: This will require outpatient further evaluation.  DVT prophylaxis: Lovenox Code  Status: Full  Family Communication: No one presented with the patient  Disposition Plan: Hopefully home  Consults called: None  Admission status: Observation    Lahoma Crocker MD, FACP [facility] Pager [phone]  If 7PM-7AM, please contact night-coverage [email or web address] Password [facility]  07/31/2017, 4:31 PM

## 2017-07-31 NOTE — ED Notes (Signed)
Patient transported to CT 

## 2017-08-01 ENCOUNTER — Emergency Department (HOSPITAL_COMMUNITY)
Admission: EM | Admit: 2017-08-01 | Discharge: 2017-08-02 | Disposition: A | Discharge status: Home / Self Care | Payer: Self-pay | Attending: Emergency Medicine | Admitting: Emergency Medicine

## 2017-08-01 ENCOUNTER — Encounter (HOSPITAL_COMMUNITY): Payer: Self-pay | Admitting: *Deleted

## 2017-08-01 DIAGNOSIS — F1910 Other psychoactive substance abuse, uncomplicated: Secondary | ICD-10-CM | POA: Insufficient documentation

## 2017-08-01 DIAGNOSIS — F1193 Opioid use, unspecified with withdrawal: Secondary | ICD-10-CM | POA: Insufficient documentation

## 2017-08-01 DIAGNOSIS — F1123 Opioid dependence with withdrawal: Secondary | ICD-10-CM

## 2017-08-01 DIAGNOSIS — F17210 Nicotine dependence, cigarettes, uncomplicated: Secondary | ICD-10-CM | POA: Insufficient documentation

## 2017-08-01 DIAGNOSIS — E876 Hypokalemia: Secondary | ICD-10-CM

## 2017-08-01 DIAGNOSIS — D649 Anemia, unspecified: Secondary | ICD-10-CM

## 2017-08-01 DIAGNOSIS — F19921 Other psychoactive substance use, unspecified with intoxication with delirium: Secondary | ICD-10-CM

## 2017-08-01 HISTORY — DX: Suicidal ideations: R45.851

## 2017-08-01 HISTORY — DX: Opioid abuse, uncomplicated: F11.10

## 2017-08-01 HISTORY — DX: Cocaine abuse, uncomplicated: F14.10

## 2017-08-01 LAB — COMPREHENSIVE METABOLIC PANEL
ALBUMIN: 3.4 g/dL — AB (ref 3.5–5.0)
ALT: 21 U/L (ref 14–54)
ANION GAP: 8 (ref 5–15)
AST: 29 U/L (ref 15–41)
Alkaline Phosphatase: 63 U/L (ref 38–126)
BUN: 6 mg/dL (ref 6–20)
CHLORIDE: 107 mmol/L (ref 101–111)
CO2: 24 mmol/L (ref 22–32)
Calcium: 8.9 mg/dL (ref 8.9–10.3)
Creatinine, Ser: 0.74 mg/dL (ref 0.44–1.00)
GFR calc Af Amer: >60 mL/min (ref >60)
GFR calc non Af Amer: >60 mL/min (ref >60)
GLUCOSE: 100 mg/dL — AB (ref 65–99)
POTASSIUM: 3.6 mmol/L (ref 3.5–5.1)
SODIUM: 139 mmol/L (ref 135–145)
Total Bilirubin: 0.4 mg/dL (ref 0.3–1.2)
Total Protein: 7 g/dL (ref 6.5–8.1)

## 2017-08-01 LAB — CBC
HEMATOCRIT: 34.3 % — AB (ref 36.0–46.0)
HEMOGLOBIN: 11.4 g/dL — AB (ref 12.0–15.0)
MCH: 28.6 pg (ref 26.0–34.0)
MCHC: 33.2 g/dL (ref 30.0–36.0)
MCV: 86 fL (ref 78.0–100.0)
Platelets: 198 10*3/uL (ref 150–400)
RBC: 3.99 MIL/uL (ref 3.87–5.11)
RDW: 14.1 % (ref 11.5–15.5)
WBC: 5.1 10*3/uL (ref 4.0–10.5)

## 2017-08-01 LAB — PREGNANCY, URINE: PREG TEST UR: NEGATIVE

## 2017-08-01 LAB — RAPID URINE DRUG SCREEN, HOSP PERFORMED
Amphetamines: NOT DETECTED
BARBITURATES: NOT DETECTED
BENZODIAZEPINES: POSITIVE — AB
COCAINE: POSITIVE — AB
OPIATES: NOT DETECTED
TETRAHYDROCANNABINOL: NOT DETECTED

## 2017-08-01 LAB — ACETAMINOPHEN LEVEL

## 2017-08-01 LAB — SALICYLATE LEVEL

## 2017-08-01 LAB — ETHANOL: Alcohol, Ethyl (B): <5 mg/dL (ref <5)

## 2017-08-01 LAB — HIV ANTIBODY (ROUTINE TESTING W REFLEX): HIV SCREEN 4TH GENERATION: NONREACTIVE

## 2017-08-01 MED ORDER — NAPROXEN 250 MG PO TABS
500.0000 mg | ORAL_TABLET | Freq: Two times a day (BID) | ORAL | Status: DC | PRN
  Administered 2017-08-01 – 2017-08-02 (×2): 500 mg via ORAL
  Filled 2017-08-01 (×2): qty 2

## 2017-08-01 MED ORDER — LOPERAMIDE HCL 2 MG PO CAPS: 2.0000 mg | ORAL_CAPSULE | ORAL | Status: DC | PRN

## 2017-08-01 MED ORDER — ZIPRASIDONE MESYLATE 20 MG IM SOLR
20.0000 mg | Freq: Once | INTRAMUSCULAR | Status: AC
  Administered 2017-08-01: 20 mg via INTRAMUSCULAR
  Filled 2017-08-01: qty 20

## 2017-08-01 MED ORDER — METHOCARBAMOL 500 MG PO TABS
500.0000 mg | ORAL_TABLET | Freq: Three times a day (TID) | ORAL | Status: DC | PRN
  Administered 2017-08-01 – 2017-08-02 (×2): 500 mg via ORAL
  Filled 2017-08-01 (×2): qty 1

## 2017-08-01 MED ORDER — CLONIDINE HCL 0.2 MG PO TABS
0.1000 mg | ORAL_TABLET | Freq: Four times a day (QID) | ORAL | Status: DC
  Administered 2017-08-01 – 2017-08-02 (×4): 0.1 mg via ORAL
  Filled 2017-08-01 (×4): qty 1

## 2017-08-01 MED ORDER — LORAZEPAM 1 MG PO TABS
1.0000 mg | ORAL_TABLET | Freq: Once | ORAL | Status: AC
  Administered 2017-08-01: 1 mg via ORAL
  Filled 2017-08-01: qty 1

## 2017-08-01 MED ORDER — DICYCLOMINE HCL 20 MG PO TABS
20.0000 mg | ORAL_TABLET | Freq: Four times a day (QID) | ORAL | Status: DC | PRN
  Administered 2017-08-01 – 2017-08-02 (×2): 20 mg via ORAL
  Filled 2017-08-01 (×2): qty 1

## 2017-08-01 MED ORDER — ONDANSETRON 4 MG PO TBDP
4.0000 mg | ORAL_TABLET | Freq: Four times a day (QID) | ORAL | Status: DC | PRN
  Administered 2017-08-02: 4 mg via ORAL
  Filled 2017-08-01: qty 1

## 2017-08-01 MED ORDER — CLONIDINE HCL 0.2 MG PO TABS
0.1000 mg | ORAL_TABLET | Freq: Every day | ORAL | Status: DC
Start: 2017-08-06 — End: 2017-08-02

## 2017-08-01 MED ORDER — HYDROXYZINE HCL 25 MG PO TABS
25.0000 mg | ORAL_TABLET | Freq: Four times a day (QID) | ORAL | Status: DC | PRN
  Administered 2017-08-01 – 2017-08-02 (×2): 25 mg via ORAL
  Filled 2017-08-01 (×3): qty 1

## 2017-08-01 MED ORDER — LORAZEPAM 2 MG/ML IJ SOLN
1.0000 mg | Freq: Once | INTRAMUSCULAR | Status: AC
  Administered 2017-08-01: 1 mg via INTRAVENOUS
  Filled 2017-08-01: qty 1

## 2017-08-01 MED ORDER — CLONIDINE HCL 0.2 MG PO TABS: 0.1000 mg | ORAL_TABLET | ORAL | Status: DC

## 2017-08-01 NOTE — ED Notes (Signed)
Regular Diet Ordered for Dinner. 

## 2017-08-01 NOTE — Discharge Summary (Signed)
Physician Discharge Summary  [place]ervoir SJG:[id number] DOB: [birth year]  PCP: Patient, No Pcp Per   Patient left the hospital [facility].  Admit date: 07/31/2017 Discharge date: 08/01/2017  Recommendations for Outpatient Follow-up:  1. Patient and patient's aunt at bedside where clearly advised to seek immediate medical attention if there was any deterioration in her medical condition.  Home Health: N/A Equipment/Devices: N/A    Discharge Condition: Guarded and at risk for decline including death. This was discussed in detail with the patient and her aunt at bedside.  CODE STATUS: Full  Diet recommendation: Regular diet.  Discharge Diagnoses:  Principal Problem:   Cocaine overdose, accidental or unintentional, initial encounter Active Problems:   Hypokalemia   Normochromic normocytic anemia   Brief Summary: 31 year old female with no known significant PMH, substance abuse ([facility] ED visit in September 2017 showed UDS positive for opiates and benzodiazepines), cocaine, was apparently found in a hotel room and dropped off to the ED by an unknown person. She was initially unresponsive and nonverbal. She had track marks upper arms. Admitting physician was unable to obtain history due to unresponsiveness and history was obtained by discussing with the ED physician and chart review. Drug overdose/cocaine overdose was suspected. She subsequently became combative and agitated in the ED. At that time her pupils were dilated, dry mucous membranes, tachycardic, dry appearing, incontinent. She required for people to hold her to start an IV. She was given 2 mg of IV Ativan with improvement in agitation. Transient low-grade fever. Oxygen saturations were borderline. No airway concerns. Due to no pinpoint pupils or respiratory depression, no Narcan was given. Hospitalist admission was requested.  She was admitted to telemetry overnight for close observation, evaluation  and management. Overnight she became alert, irritable, confused and uncooperative with care (refused vitals, pulled off telemetry, refused IV fluids, found smoking in the hospital room and even attempted to leave the hospital via emergency exit). She stated that she did not feel like she was withdrawing but was instead having "a bad trip as if she had smoked too much marijuana".   By the time I arrived on the floor, [place] [facility] police were at her door for security purposes. Patient's RN had met with patient and her aunt and patient refused further hospitalization and had signed AMA paperwork and was getting dressed and ready to leave. I went in and met with patient and her aunt. Patient was standing up next to the bedside, unsteady, attempting to put on her clothes, alert and oriented with no obvious focal deficits, anxious-appearing and irritable but in no distress, demanding to go out to smoke. She was advised that she was not yet stable for discharge home. She was advised to allow [place] to continue inpatient evaluation, treatment and stabilization before safe discharge home which could take another day or 2. Patient was coherent but persistently declined further hospitalization and insisted on leaving AMA. Patient and her aunt were warned that leaving the hospital early and prior to medical stabilization could lead to significant decline in her medical condition, multiple complications and even death. She however was not convinced and left AMA. She was advised to seek immediate medical attention in case there was any further decline in her condition. We believe that she has capacity to make medical decisions but is making poor choices/judgment. I discussed in detail with patient's RN.   Hypokalemia was replaced. Anemia was stable.  Consultations:  None  Procedures:  None  Discharge Instructions N/A. Please see above.    Medication List    You have not been prescribed any  medications.     No Known Allergies    Procedures/Studies: Ct Head Wo Contrast  Result Date: 07/31/2017 CLINICAL DATA:  Altered level of consciousness. EXAM: CT HEAD WITHOUT CONTRAST TECHNIQUE: Contiguous axial images were obtained from the base of the skull through the vertex without intravenous contrast. COMPARISON:  None. FINDINGS: Brain: No evidence of acute infarction, hemorrhage, hydrocephalus, extra-axial collection or mass lesion/mass effect. Vascular: No hyperdense vessel or unexpected calcification. Skull: Normal. Negative for fracture or focal lesion. Sinuses/Orbits: Mild bilateral ethmoid and right maxillary sinusitis is noted. Other: None. IMPRESSION: Normal head CT. Electronically Signed   By: Marijo Conception, M.D.   On: 07/31/2017 13:05   Dg Chest Port 1 View  Result Date: 07/31/2017 CLINICAL DATA:  Unresponsive. EXAM: PORTABLE CHEST 1 VIEW COMPARISON:  None. FINDINGS: The cardiomediastinal silhouette is normal in size. Normal pulmonary vascularity. No focal consolidation, pleural effusion, or pneumothorax. No acute osseous abnormality. IMPRESSION: No active cardiopulmonary disease. Electronically Signed   By: Titus Dubin M.D.   On: 07/31/2017 11:52      Subjective: "I want to go out and smoke".   Discharge Exam:  Vitals:   07/31/17 1615 07/31/17 1630 07/31/17 1645 07/31/17 2054  BP: 103/61 107/67 110/66 (!) 98/55  Pulse: 85 89 85 83  Resp: 14 13 (!) 9 12  Temp:    98.5 F (36.9 C)  TempSrc:      SpO2: 100% 98% 99% 99%  Weight:    48.1 kg (106 lb)  Height:        Patient had signed out [place] paperwork and was getting ready to leave and not cooperative for any evaluation or treatment.    The results of significant diagnostics from this hospitalization (including imaging, microbiology, ancillary and laboratory) are listed below for reference.     Microbiology: Recent Results (from the past 240 hour(s))  Blood Culture (routine x 2)     Status: None  (Preliminary result)   Collection Time: 07/31/17 11:45 AM  Result Value Ref Range Status   Specimen Description BLOOD RIGHT HAND  Final   Special Requests   Final    BOTTLES DRAWN AEROBIC AND ANAEROBIC Blood Culture adequate volume   Culture NO GROWTH < 24 HOURS  Final   Report Status PENDING  Incomplete  Blood Culture (routine x 2)     Status: None (Preliminary result)   Collection Time: 07/31/17 11:55 AM  Result Value Ref Range Status   Specimen Description BLOOD LEFT HAND  Final   Special Requests   Final    BOTTLES DRAWN AEROBIC AND ANAEROBIC Blood Culture adequate volume   Culture NO GROWTH < 24 HOURS  Final   Report Status PENDING  Incomplete     Labs: CBC:  Recent Labs Lab 07/31/17 1058 08/01/17 1122  WBC 5.3 5.1  NEUTROABS 4.1  --   HGB 10.3* 11.4*  HCT 31.4* 34.3*  MCV 85.8 86.0  PLT 198 382   Basic Metabolic Panel:  Recent Labs Lab 07/31/17 1058 08/01/17 1122  NA 141 139  K 3.4* 3.6  CL 106 107  CO2 28 24  GLUCOSE 127* 100*  BUN 8 6  CREATININE 0.81 0.74  CALCIUM 8.6* 8.9   Liver Function Tests:  Recent Labs Lab 07/31/17 1058 08/01/17 1122  AST 24 29  ALT 21 21  ALKPHOS 55 63  BILITOT 0.3 0.4  PROT 6.1* 7.0  ALBUMIN 3.1* 3.4*   Cardiac Enzymes:  Recent Labs Lab 07/31/17 1058  CKTOTAL 48   Urinalysis    Component Value Date/Time   COLORURINE YELLOW 07/31/2017 1352   APPEARANCEUR CLEAR 07/31/2017 1352   LABSPEC 1.005 07/31/2017 1352   PHURINE 6.0 07/31/2017 1352   GLUCOSEU NEGATIVE 07/31/2017 1352   HGBUR NEGATIVE 07/31/2017 1352   BILIRUBINUR NEGATIVE 07/31/2017 1352   KETONESUR NEGATIVE 07/31/2017 1352   PROTEINUR NEGATIVE 07/31/2017 1352   NITRITE NEGATIVE 07/31/2017 1352   LEUKOCYTESUR NEGATIVE 07/31/2017 1352   POC troponin 1 negative, venous lactate negative, serum acetaminophen level <09, serum salicylate level <7, HIV screen nonreactive, initial UDS positive for cocaine, opiates and amphetamines, blood alcohol level  <5, i-STAT hCG <5 .   I discussed with patient's aunt at bedside.   Time coordinating discharge: Over 30 minutes  SIGNED:  Vernell Leep, MD, FACP, [place]. [facility] Pager [phone]  If 7PM-7AM, please contact night-coverage [email or web address] Password [facility] 08/01/2017, 2:36 PM

## 2017-08-01 NOTE — ED Provider Notes (Signed)
[facility]-EMERGENCY DEPT Provider Note   CSN: [id number] Arrival date & time: 08/01/17  1036     History   Chief Complaint Chief Complaint  Patient presents with  . IVC  . Withdrawal    HPI Diane Foley is a 31 y.o. female.  She presents for evaluation of concern for drug poisoning, because the heroin she injected yesterday made her feel "different than usual."  She states that after injecting into her left wrist yesterday, she immediately was "stoned."  After that she presented to the ED, where she was evaluated and found to have decreased responsiveness so was admitted to the hospital.  Earlier this morning she awoke and became agitated, began smoking in her hospital room, then wandered away from her room.  She ultimately signed out AGAINST MEDICAL ADVICE.  Today family members brought her back, and have wanted the magistrate and arranged for involuntary commitment.  They stated that she is "not sleeping or taking care of herself.  They report that she "wants to die."  Family members believe that she is trying to avoid getting medical help.  They proceeded to petition for commitment, patient has been served this paperwork in the emergency department.  patient apparently continues to use heroin for times a day.  She denies other recent illnesses.  There are no other known modifying factors.  HPI  Past Medical History:  Diagnosis Date  . Anxiety   . Cocaine abuse   . Depression   . Drug abuse   . Opioid abuse   . PTSD (post-traumatic stress disorder)   . Sepsis (HCC)   . Suicidal ideations   . UTI (urinary tract infection)     Patient Active Problem List   Diagnosis Date Noted  . Cocaine overdose, accidental or unintentional, initial encounter 07/31/2017  . Hypokalemia 07/31/2017  . Normochromic normocytic anemia 07/31/2017    Past Surgical History:  Procedure Laterality Date  . APPENDECTOMY      OB History    No data available       Home Medications    Prior to  Admission medications   Medication Sig Start Date End Date Taking? Authorizing Provider  cloNIDine (CATAPRES) 0.1 MG tablet Take 1 tablet (0.1 mg total) by mouth 2 (two) times daily. Take 1 tablet (0.1 mg) by mouth twice daily for 2 days, then 1 tablet (0.1 mg) once daily for 3 more days 08/02/17   Shaune Pollack, MD  dicyclomine (BENTYL) 20 MG tablet Take 1 tablet (20 mg total) by mouth 4 (four) times daily -  before meals and at bedtime. 08/02/17 08/07/17  Shaune Pollack, MD  hydrOXYzine (ATARAX/VISTARIL) 25 MG tablet Take 1 tablet (25 mg total) by mouth every 6 (six) hours as needed for anxiety. 08/02/17   Shaune Pollack, MD  loperamide (IMODIUM) 2 MG capsule Take 1 capsule (2 mg total) by mouth 4 (four) times daily as needed for diarrhea or loose stools. 08/02/17   Shaune Pollack, MD  naproxen (NAPROSYN) 375 MG tablet Take 1 tablet (375 mg total) by mouth 2 (two) times daily as needed for moderate pain. 08/02/17 08/09/17  Shaune Pollack, MD    Family History No family history on file.  Social History Social History  Substance Use Topics  . Smoking status: Current Every Day Smoker    Types: Cigarettes  . Smokeless tobacco: Not on file  . Alcohol use 0.6 oz/week    1 Cans of beer per week     Allergies   Patient has  no known allergies.   Review of Systems Review of Systems  All other systems reviewed and are negative.    Physical Exam Updated Vital Signs BP 129/89 (BP Location: Right Arm)   Pulse (!) 55   Temp 98 F (36.7 C) (Oral)   Resp 16   SpO2 100%   Physical Exam  Constitutional: She is oriented to person, place, and time. She appears well-developed.  She appears under nourished and unkempt  HENT:  Head: Normocephalic and atraumatic.  Eyes: Pupils are equal, round, and reactive to light. Conjunctivae and EOM are normal.  Neck: Normal range of motion and phonation normal. Neck supple.  Cardiovascular: Normal rate and regular rhythm.   Pulmonary/Chest: Effort  normal and breath sounds normal. She exhibits no tenderness.  Abdominal: Soft. She exhibits no distension. There is no tenderness. There is no guarding.  Musculoskeletal: Normal range of motion.  Neurological: She is alert and oriented to person, place, and time. She exhibits normal muscle tone.  Skin: Skin is warm and dry.  Track marks left volar forearm and wrist.  No areas of associated induration, fluctuance, or tenderness.  Psychiatric:  Agitated, tearful, inappropriate response to examiner patient relationship.  Nursing note and vitals reviewed.    ED Treatments / Results  Labs (all labs ordered are listed, but only abnormal results are displayed) Labs Reviewed  COMPREHENSIVE METABOLIC PANEL - Abnormal; Notable for the following:       Result Value   Glucose, Bld 100 (*)    Albumin 3.4 (*)    All other components within normal limits  ACETAMINOPHEN LEVEL - Abnormal; Notable for the following:    Acetaminophen (Tylenol), Serum <10 (*)    All other components within normal limits  CBC - Abnormal; Notable for the following:    Hemoglobin 11.4 (*)    HCT 34.3 (*)    All other components within normal limits  RAPID URINE DRUG SCREEN, HOSP PERFORMED - Abnormal; Notable for the following:    Cocaine POSITIVE (*)    Benzodiazepines POSITIVE (*)    All other components within normal limits  ETHANOL  SALICYLATE LEVEL  PREGNANCY, URINE    EKG  EKG Interpretation None       Radiology Ct Head Wo Contrast  Result Date: 07/31/2017 CLINICAL DATA:  Altered level of consciousness. EXAM: CT HEAD WITHOUT CONTRAST TECHNIQUE: Contiguous axial images were obtained from the base of the skull through the vertex without intravenous contrast. COMPARISON:  None. FINDINGS: Brain: No evidence of acute infarction, hemorrhage, hydrocephalus, extra-axial collection or mass lesion/mass effect. Vascular: No hyperdense vessel or unexpected calcification. Skull: Normal. Negative for fracture or focal  lesion. Sinuses/Orbits: Mild bilateral ethmoid and right maxillary sinusitis is noted. Other: None. IMPRESSION: Normal head CT. Electronically Signed   By: Lupita Raider, M.D.   On: 07/31/2017 13:05   Dg Chest Port 1 View  Result Date: 07/31/2017 CLINICAL DATA:  Unresponsive. EXAM: PORTABLE CHEST 1 VIEW COMPARISON:  None. FINDINGS: The cardiomediastinal silhouette is normal in size. Normal pulmonary vascularity. No focal consolidation, pleural effusion, or pneumothorax. No acute osseous abnormality. IMPRESSION: No active cardiopulmonary disease. Electronically Signed   By: Obie Dredge M.D.   On: 07/31/2017 11:52    Procedures Procedures (including critical care time)  Medications Ordered in ED Medications  dicyclomine (BENTYL) tablet 20 mg (20 mg Oral Given 08/02/17 0744)  hydrOXYzine (ATARAX/VISTARIL) tablet 25 mg (25 mg Oral Given 08/02/17 0744)  loperamide (IMODIUM) capsule 2-4 mg (not administered)  methocarbamol (  ROBAXIN) tablet 500 mg (500 mg Oral Given 08/02/17 0907)  naproxen (NAPROSYN) tablet 500 mg (500 mg Oral Given 08/02/17 0907)  ondansetron (ZOFRAN-ODT) disintegrating tablet 4 mg (4 mg Oral Given 08/02/17 0744)  cloNIDine (CATAPRES) tablet 0.1 mg (0.1 mg Oral Given 08/02/17 0907)    Followed by  cloNIDine (CATAPRES) tablet 0.1 mg (not administered)    Followed by  cloNIDine (CATAPRES) tablet 0.1 mg (not administered)  ziprasidone (GEODON) injection 20 mg (20 mg Intramuscular Given 08/01/17 1545)     Initial Impression / Assessment and Plan / ED Course  I have reviewed the triage vital signs and the nursing notes.  Pertinent labs & imaging results that were available during my care of the patient were reviewed by me and considered in my medical decision making (see chart for details).    Patient Vitals for the past 24 hrs:  BP Temp Temp src Pulse Resp SpO2  08/02/17 1040 129/89 98 F (36.7 C) Oral (!) 55 16 100 %  08/02/17 0615 114/71 97.7 F (36.5 C) Axillary 67 18  99 %  08/01/17 1835 116/74 98 F (36.7 C) Oral (!) 53 16 99 %  08/01/17 1652 (!) 123/106 97.8 F (36.6 C) Oral (!) 59 16 100 %  08/01/17 1419 (!) 135/91 - - - - -    Involuntary commitment upheld, by me.  First opinion paperwork filled out.   Final Clinical Impressions(s) / ED Diagnoses   Final diagnoses:  Narcotic withdrawal (HCC)  Polysubstance abuse   Narcotic withdrawal, with suicidal ideation.  Patient requires hospitalization for assessment and treatment by psychiatry.  Nursing Notes Reviewed/ Care Coordinated Applicable Imaging Reviewed Interpretation of Laboratory Data incorporated into ED treatment  Plan-as per oncoming provider team in conjunction with TTS   New Prescriptions Current Discharge Medication List    START taking these medications   Details  cloNIDine (CATAPRES) 0.1 MG tablet Take 1 tablet (0.1 mg total) by mouth 2 (two) times daily. Take 1 tablet (0.1 mg) by mouth twice daily for 2 days, then 1 tablet (0.1 mg) once daily for 3 more days Qty: 7 tablet, Refills: 0    dicyclomine (BENTYL) 20 MG tablet Take 1 tablet (20 mg total) by mouth 4 (four) times daily -  before meals and at bedtime. Qty: 20 tablet, Refills: 0    hydrOXYzine (ATARAX/VISTARIL) 25 MG tablet Take 1 tablet (25 mg total) by mouth every 6 (six) hours as needed for anxiety. Qty: 20 tablet, Refills: 0    loperamide (IMODIUM) 2 MG capsule Take 1 capsule (2 mg total) by mouth 4 (four) times daily as needed for diarrhea or loose stools. Qty: 12 capsule, Refills: 0    naproxen (NAPROSYN) 375 MG tablet Take 1 tablet (375 mg total) by mouth 2 (two) times daily as needed for moderate pain. Qty: 20 tablet, Refills: 0         Mancel Bale, MD 08/02/17 1109

## 2017-08-01 NOTE — Progress Notes (Signed)
Pt is crying and begging to be left alone which can be heard down the hallway. Pt's mother and Celine Ahr are at the bedside. Upon entering the room pt is walking in circles pulling on her IV line and heart monitor. Pt is tearful. Encouraged pt to stay and sit back down, as she is unsteady on her feet. Family concerned that pt will leave without getting the help she needs. Page to [facility]. [place] for notification. Family made aware that we can not make her stay should she choose to leave AMA. Family educated that they may attempt to go to magistrate and have her IVC'd should they choose to do so.   Pt also reports that she does not feel like she is withdrawing but instead is having "a bad trip as if she had smoked too much marijuana". New order for ativan 1 mg IV per M. Lynch. Will continue to monitor. Dondra Spry, RN

## 2017-08-01 NOTE — Progress Notes (Signed)
Patient found smoking in room. Explained to patient the importance of not smoking, the risks, and hospital policy. Security paged to come talk to family and patient. Patient still refusing IV fluids, and refusing telemetry.  MD aware.   Avelina Laine RN

## 2017-08-01 NOTE — ED Notes (Signed)
Dr Effie Shy completed 1st exam - copy of IVC papers faxed to [facility], copy sent to Medical Records, original placed in folder for Magistrate, and all 3 sets placed on clipboard.

## 2017-08-01 NOTE — Progress Notes (Signed)
Pt refused vitals, Kami Notified

## 2017-08-01 NOTE — ED Notes (Signed)
Pt attempting to leave room. Sitter and RN attempting to discuss w/pt. Pt continues to state she wants to leave and is refusing to be cooperative w/staff - pt attempted to get close to RN's face as she repetitively stated "I want to go!" Shanda Bumps, Consulting civil engineer, aware - Engineer, water.

## 2017-08-01 NOTE — ED Notes (Addendum)
Pt lying on bed - noted to be moaning and yelling out at times.

## 2017-08-01 NOTE — ED Notes (Signed)
Pt ambulated to bathroom and back to room w/assistance from [place]. Pt asking to shower - advised unable to do so d/t unstable and too sleepy. Encouraged pt to lie down on stretcher and rest.

## 2017-08-01 NOTE — ED Notes (Addendum)
Geodon drawn by Clelia Schaumann. Pt tolerated injection well.

## 2017-08-01 NOTE — BH Assessment (Signed)
Tele Assessment Note   Patient Name: Diane Foley MRN: [id number] Referring Physician: Effie Shy Location of Patient: [facility] Location of Provider: Behavioral Health TTS Department  Amyah Clawson is an 31 y.o. female who presents to [facility] under involuntary commitment. Patient was brought to [facility] on 8/24 by unknown person from a hotel. It was a suspected overdose as patient was positive for opiates, cocaine, and amphetamines. Patient droggy, confused and irritable when she woke up. Patient left AMA. Mother IVC'd her and brought patient back to ED on 8/25. Patient now positive for cocaine and benzos.  During assessment patient denies SI, HI and AVH. Patient admits to using heroin and crack yesterday and stated she has been using for 10 years. Patient stated someone put "something else in it." Patient presents to be going through withdrawals, asking if she can just go to sleep. Patient irritable, states she is restless. Patient refusing treatment. Patient walked off during assessment and went to the bathroom.   Collateral information obtained from mother Janmarie Smoot who was present. Patient's aunt also present. Patient's mother reported that patient endorsed SI earlier that day and yesterday stating she wanted to die. Mother stated she thinks patient got a hold of some "bad drugs." Mother stated "she needs help or she is going to die." Mother unaware of previous OD. Mother reported patient was in [facility] 3-4 years ago for SI. Mother denies knowledge of suicide attempts. Mother denies patient has history of seizures. 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 Mother states patient went to [facility] clinic years ago.  Diagnosis: Cocaine Use Disorder, severe Opiate Use Disorder, severe  Past Medical History: No past medical history on file.  No past surgical history on file.  Family History: No family history on file.  Social History:  has no tobacco, alcohol, and drug history on  file.  Additional Social History:  Alcohol / Drug Use Pain Medications: none reported Prescriptions: none reported  Over the Counter: none reported History of alcohol / drug use?: Yes Substance #1 Name of Substance 1: heroine 1 - Age of First Use: 21 1 - Amount (size/oz): unk 1 - Frequency: daily 1 - Duration: ongoing 1 - Last Use / Amount: 07/31/17 Substance #2 Name of Substance 2: crack cocaine 2 - Age of First Use: 21 2 - Amount (size/oz): unk  2 - Frequency: daily 2 - Duration: ongoing 2 - Last Use / Amount: 07/31/17  CIWA: CIWA-Ar BP: (!) 135/91 Pulse Rate: (!) 119 COWS:    PATIENT STRENGTHS: (choose at least two) Communication skills Supportive family/friends  Allergies: No Known Allergies  Home Medications:  (Not in a hospital admission)  OB/GYN Status:  No LMP recorded.  General Assessment Data Location of Assessment: [facility] ED TTS Assessment: In system Is this a Tele or Face-to-Face Assessment?: Tele Assessment Is this an Initial Assessment or a Re-assessment for this encounter?: Initial Assessment Marital status: Single Maiden name: Herzig Is patient pregnant?: Unknown Pregnancy Status: Unknown Living Arrangements: Other (Comment) Can pt return to current living arrangement?: No Admission Status: Involuntary Is patient capable of signing voluntary admission?: Yes Referral Source: Self/Family/Friend Insurance type: unk     Crisis Care Plan Living Arrangements: Other (Comment) Name of Psychiatrist:  (none reported) Name of Therapist:  (none reported)     Risk to self with the past 6 months Suicidal Ideation: Yes-Currently Present Has patient been a risk to self within the past 6 months prior to admission? : Yes Suicidal Intent: Yes-Currently Present  Has patient had any suicidal intent within the past 6 months prior to admission? : No Is patient at risk for suicide?: Yes Suicidal Plan?: No-Not Currently/Within Last 6 Months Has patient had any  suicidal plan within the past 6 months prior to admission? : No Access to Means: No What has been your use of drugs/alcohol within the last 12 months?: positive for cocaine, benzos, opiates, amphatemines Previous Attempts/Gestures: No How many times?: 0 Other Self Harm Risks: none reported Triggers for Past Attempts: Unknown Intentional Self Injurious Behavior: None Family Suicide History: Unknown Recent stressful life event(s): Other (Comment) (drug use) Persecutory voices/beliefs?: No Depression: No Depression Symptoms: Feeling angry/irritable Substance abuse history and/or treatment for substance abuse?: Yes Suicide prevention information given to non-admitted patients: Not applicable  Risk to Others within the past 6 months Homicidal Ideation: No Does patient have any lifetime risk of violence toward others beyond the six months prior to admission? : No Thoughts of Harm to Others: No Current Homicidal Intent: No Current Homicidal Plan: No Access to Homicidal Means: No Identified Victim:  (NA) History of harm to others?: No Assessment of Violence: None Noted Violent Behavior Description: none reported Does patient have access to weapons?: No Criminal Charges Pending?: No Does patient have a court date: No Is patient on probation?: Unknown  Psychosis Hallucinations: None noted Delusions: None noted  Mental Status Report Appearance/Hygiene: Bizarre, Disheveled Eye Contact: Poor Motor Activity: Restlessness Speech: Logical/coherent Level of Consciousness: Restless, Irritable Mood: Irritable Affect: Irritable Anxiety Level: Severe Thought Processes: Coherent, Relevant Judgement: Impaired Orientation: Person, Place, Situation Obsessive Compulsive Thoughts/Behaviors: Moderate  Cognitive Functioning Concentration: Decreased Memory: Recent Intact, Remote Intact IQ:  (UTA) Insight: Poor Impulse Control: Poor Appetite: Poor Weight Loss:  (Unk) Weight Gain:   (unk) Sleep: Unable to Assess Total Hours of Sleep:  (unk) Vegetative Symptoms: Unable to Assess  ADLScreening [facility] Assessment Services) Patient's cognitive ability adequate to safely complete daily activities?: Yes Patient able to express need for assistance with ADLs?: No Independently performs ADLs?: Yes (appropriate for developmental age)  Prior Inpatient Therapy Prior Inpatient Therapy: Yes Prior Therapy Dates: 3-4 years ago Prior Therapy Facilty/Provider(s): [facility] Reason for Treatment: SI  Prior Outpatient Therapy Prior Outpatient Therapy: No Prior Therapy Dates:  (NA) Prior Therapy Facilty/Provider(s):  (NA) Reason for Treatment:  (NA) Does patient have an ACCT team?: No Does patient have Intensive In-House Services?  : No Does patient have [facility] services? : No Does patient have P4CC services?: No  ADL Screening (condition at time of admission) Patient's cognitive ability adequate to safely complete daily activities?: Yes Is the patient deaf or have difficulty hearing?: No Does the patient have difficulty seeing, even when wearing glasses/contacts?: No Does the patient have difficulty concentrating, remembering, or making decisions?: No Patient able to express need for assistance with ADLs?: No Does the patient have difficulty dressing or bathing?: No Independently performs ADLs?: Yes (appropriate for developmental age)       Abuse/Neglect Assessment (Assessment to be complete while patient is alone) Physical Abuse: Denies Verbal Abuse: Denies Sexual Abuse: Denies Exploitation of patient/patient's resources: Denies Self-Neglect: Denies     Merchant navy officer (For Healthcare) Does Patient Have a Medical Advance Directive?: No    Additional Information 1:1 In Past 12 Months?: No CIRT Risk: Yes Elopement Risk: Yes Does patient have medical clearance?: Yes     Disposition:  Disposition Initial Assessment Completed for this Encounter: Yes Disposition of  Patient: Inpatient treatment program Type of inpatient treatment program: Adult  This  service was provided via telemedicine using a 2-way, interactive audio and Immunologist.    Hessie Dibble 08/01/2017 2:52 PM

## 2017-08-01 NOTE — Progress Notes (Signed)
Patient has been referred to the following inpatient psychiatric treatment programs at:   [facility], [place], and [place].  At capacity: [address], [place], [place], [place], [place], [place], and [place].  CSW in disposition will continue to seek placement for this patient.  Melbourne Abts, MSW, LCSWA Clinical social worker in disposition [facility], TTS Office [phone] and [phone] 08/01/2017 5:02 PM

## 2017-08-01 NOTE — BHH Counselor (Signed)
Clinician contacted patient's nurse Toni Amend to arrange set up of tele-assessment. Clinician informed tele assessment to be set up in next 5 mins.   Nira Retort, MSW, LCSW Triage Specialist [phone],

## 2017-08-01 NOTE — ED Notes (Signed)
Pt initially calmed after Geodon given. Pt now back w/moaning loudly, has removed pants and ambulating around in room, and yelling "Help me!"

## 2017-08-01 NOTE — ED Notes (Signed)
Pt yelling out in room "give me my pills", and hitting door glass.  Toni Amend RN in another pt room, this nurse gave pt medications.  Pt now eating applesauce.

## 2017-08-01 NOTE — Progress Notes (Signed)
Pt is up and and notably anxious. Pt is pulling on telemetry. Pt continues to refuse IV fluids, With reports of "being in hell" when she passed out. Pt's mother and aunt remain at the bedside. Page to [facility]. [place] for notification. New order for ativan 1 mg IV Dondra Spry, RN

## 2017-08-01 NOTE — BHH Counselor (Addendum)
Clinician contacted patient's nurse Courtney to arrange set up of tele-assessment. Clinician informed tele assessment to be set up in next 5 mins.   Lendora Keys, MSW, LCSW Triage Specialist [phone],  

## 2017-08-01 NOTE — ED Triage Notes (Signed)
Pt is here -- IVC 'd-- left AMA from [facility] in the middle of the night. Mother took papers out-- pt is in hospital gown, is "dope sick"-- has dry heaves,

## 2017-08-01 NOTE — Progress Notes (Signed)
Patient refusing care. Screaming and yelling, and insisting on smoking a cigarette. Patient tried to go down the side stairwell.   Security called. Patient stated she is leaving. AMA paper prepared and patient signed AMA. Security and Police escorted the patient off the unit.  Diane Laine RN

## 2017-08-01 NOTE — ED Notes (Signed)
Fuller Song, Lab Tech, to add on urine pregnancy.

## 2017-08-01 NOTE — ED Notes (Signed)
Pt noted to be moaning and yelling "Help me!" - continuously. Meds given to assist pt w/ withdrawal complaints. Staff asked pt to please lower tone of voice and stop hitting side of bed. Pt states "I'm leaving!"

## 2017-08-01 NOTE — Progress Notes (Signed)
Pt has gone to sleep and is resting comfortably with her mother and aunt at the bedside. Pt's significant other and friend have come to see pt. Visitor's informed that pt is sleeping and that now is not a good time. Pt's mother and pt's friends have an exchange of dialogue, at which time pt's significant other and friend are asked to leave. Pt continues to rest comfortably. Will continue to monitor. Dondra Spry, RN

## 2017-08-01 NOTE — BH Assessment (Signed)
Clinician consulted with Lerry Paterson, NP who states Pt meets criteria for inpatient admission. Per Berneice Heinrich, AC no available bed at this time. Disposition CSW will seek alternative placement bed.   Nira Retort, MSW, LCSW Triage Specialist [phone],

## 2017-08-02 MED ORDER — DICYCLOMINE HCL 20 MG PO TABS: 20.0000 mg | ORAL_TABLET | Freq: Three times a day (TID) | ORAL | 0 refills | Status: DC

## 2017-08-02 MED ORDER — LOPERAMIDE HCL 2 MG PO CAPS: 2.0000 mg | ORAL_CAPSULE | Freq: Four times a day (QID) | ORAL | 0 refills | Status: DC | PRN

## 2017-08-02 MED ORDER — NAPROXEN 375 MG PO TABS: 375.0000 mg | ORAL_TABLET | Freq: Two times a day (BID) | ORAL | 0 refills | Status: DC | PRN

## 2017-08-02 MED ORDER — HYDROXYZINE HCL 25 MG PO TABS: 25.0000 mg | ORAL_TABLET | Freq: Four times a day (QID) | ORAL | 0 refills | Status: DC | PRN

## 2017-08-02 MED ORDER — CLONIDINE HCL 0.1 MG PO TABS: 0.1000 mg | ORAL_TABLET | Freq: Two times a day (BID) | ORAL | 0 refills | Status: DC

## 2017-08-02 MED ORDER — NAPROXEN 375 MG PO TABS: 375.0000 mg | ORAL_TABLET | Freq: Two times a day (BID) | ORAL | 0 refills | Status: AC | PRN

## 2017-08-02 NOTE — ED Notes (Signed)
Pt continuing to yell out. Asked pt if she is serious about receiving tx - pt refused to answer - stated "I just want to feel comfortable. Every other hospital gives me medicine to make me comfortable". Asked what she was given - states Clonidine and sleep med. Advised her she is receiving these meds - unable to give sleep med at this time d/t day time. Continues to state she wants sleep med. Then asked for Ginger Ale - given.

## 2017-08-02 NOTE — ED Notes (Signed)
Pt c/o nausea - Zofran given. States "I just don't feel good".

## 2017-08-02 NOTE — ED Notes (Signed)
Pt would not give number on pain scale. States "I just don't feel good".

## 2017-08-02 NOTE — ED Notes (Signed)
Pt's mother picked up pt from ED. Voiced understanding of d/c plan and resources given.

## 2017-08-02 NOTE — ED Notes (Signed)
IVC papers rescinded by Dr Erma Heritage - copy faxed to [facility], copy sent to Medical Records, and original placed in folder for Magistrate/Clerk of Court.

## 2017-08-02 NOTE — ED Notes (Signed)
Pt did not eat breakfast. Pt lying on bed w/eyes closed - yelling "Someone help me please!" When asked what she needed - states "That medicine you gave me didn't do shit!" Requested for pt to lower tone of voice. Advised will give additional med as ordered.

## 2017-08-02 NOTE — ED Notes (Addendum)
Called pt's mother, Natira Lungren, and advised of pt's d/c. States she will come to ED to pick up pt. RN assisted pt w/putting on her pants and mix-matched socks - no shirt or shoes noted. Pt states she wants to go to her mother's house. Continues to deny SI/HI.

## 2017-08-02 NOTE — ED Provider Notes (Signed)
The patient has been reassessed by TTS this morning. The patient currently denies any SI, HI, or auditory or visual hallucinations. She is requesting discharge and per TTS, they recommend rescinding the patient's IVC and discharge. Patient will be provided with outpatient resource as well as prescriptions for opiate withdrawal. She currently denies any SI, HI, or auditory visual hallucinations on my examination. She appears hemodynamically stable.  This note was prepared with assistance of Conservation officer, historic buildings. Occasional wrong-word or sound-a-like substitutions may have occurred due to the inherent limitations of voice recognition software.    Shaune Pollack, MD 08/02/17 1032

## 2017-08-02 NOTE — Progress Notes (Signed)
Patient is on the [facility] waitlist, pe Dyke Maes, on 08/02/17.  Patient's referral was followed up at the following inpatient psychiatric treatment programs: [facility] - per [place], refax. [facility] - per [place], referral not found. Referral refaxed. Referral has been sent today to: [facility], [facility], [facility].  At capacity: [address], [place], [place], [place], [place], and [place].  CSW in disposition will continue to seek placement for this patient.  Melbourne Abts, MSW, LCSWA Clinical social worker in disposition [facility], TTS Office [phone] and [phone] 08/02/2017 10:18 AM

## 2017-08-02 NOTE — ED Notes (Signed)
Re-TTS being performed.  

## 2017-08-02 NOTE — BH Assessment (Signed)
Writer reassessed pt. She was sleepy and irritable. She answered all questions with one word answers. Pt replied "no" when asked if she was suicidal or she wanted to harm anyone. She replied "no" when asked re: [facility]. No delusions noted. When asked why her mom said in IVC that pt was trying to kill herself with drug use, pt says, "I don't know." Pt reports she slept "horrible." Pt reports poor appetite. She reports no prior suicide attempts. Leighton Ruff NP recommends pt's IVC be rescinded as pt does not meet inpatient criteria. Per Beryle Lathe NP, pt can be d/c. Kriste Basque RN will provide outpatient SA resources to pt.

## 2017-08-03 ENCOUNTER — Encounter (HOSPITAL_COMMUNITY): Payer: Self-pay

## 2017-08-05 LAB — CULTURE, BLOOD (ROUTINE X 2)
CULTURE: NO GROWTH
Culture: NO GROWTH
SPECIAL REQUESTS: ADEQUATE
SPECIAL REQUESTS: ADEQUATE

## 2017-12-19 ENCOUNTER — Other Ambulatory Visit: Payer: Self-pay

## 2017-12-19 ENCOUNTER — Encounter (HOSPITAL_COMMUNITY): Payer: Self-pay

## 2017-12-19 ENCOUNTER — Emergency Department (HOSPITAL_COMMUNITY)

## 2017-12-19 ENCOUNTER — Inpatient Hospital Stay (HOSPITAL_COMMUNITY)
Admission: EM | Admit: 2017-12-19 | Discharge: 2017-12-23 | DRG: 917 | Disposition: A | Discharge status: Home / Self Care | Attending: Internal Medicine | Admitting: Internal Medicine

## 2017-12-19 DIAGNOSIS — R9431 Abnormal electrocardiogram [ECG] [EKG]: Secondary | ICD-10-CM | POA: Diagnosis present

## 2017-12-19 DIAGNOSIS — I4581 Long QT syndrome: Secondary | ICD-10-CM | POA: Diagnosis present

## 2017-12-19 DIAGNOSIS — B9620 Unspecified Escherichia coli [E. coli] as the cause of diseases classified elsewhere: Secondary | ICD-10-CM | POA: Diagnosis present

## 2017-12-19 DIAGNOSIS — R112 Nausea with vomiting, unspecified: Secondary | ICD-10-CM

## 2017-12-19 DIAGNOSIS — F1193 Opioid use, unspecified with withdrawal: Secondary | ICD-10-CM

## 2017-12-19 DIAGNOSIS — R4182 Altered mental status, unspecified: Secondary | ICD-10-CM | POA: Diagnosis not present

## 2017-12-19 DIAGNOSIS — E860 Dehydration: Secondary | ICD-10-CM

## 2017-12-19 DIAGNOSIS — R7689 Other specified abnormal immunological findings in serum: Secondary | ICD-10-CM | POA: Diagnosis present

## 2017-12-19 DIAGNOSIS — G92 Toxic encephalopathy: Secondary | ICD-10-CM | POA: Diagnosis present

## 2017-12-19 DIAGNOSIS — F319 Bipolar disorder, unspecified: Secondary | ICD-10-CM | POA: Diagnosis present

## 2017-12-19 DIAGNOSIS — G9340 Encephalopathy, unspecified: Secondary | ICD-10-CM | POA: Diagnosis present

## 2017-12-19 DIAGNOSIS — F17210 Nicotine dependence, cigarettes, uncomplicated: Secondary | ICD-10-CM | POA: Diagnosis present

## 2017-12-19 DIAGNOSIS — T401X1A Poisoning by heroin, accidental (unintentional), initial encounter: Secondary | ICD-10-CM | POA: Diagnosis not present

## 2017-12-19 DIAGNOSIS — F1910 Other psychoactive substance abuse, uncomplicated: Secondary | ICD-10-CM | POA: Diagnosis present

## 2017-12-19 DIAGNOSIS — R197 Diarrhea, unspecified: Secondary | ICD-10-CM

## 2017-12-19 DIAGNOSIS — F1120 Opioid dependence, uncomplicated: Secondary | ICD-10-CM | POA: Diagnosis present

## 2017-12-19 DIAGNOSIS — N390 Urinary tract infection, site not specified: Secondary | ICD-10-CM | POA: Diagnosis present

## 2017-12-19 DIAGNOSIS — R5383 Other fatigue: Secondary | ICD-10-CM

## 2017-12-19 DIAGNOSIS — F1123 Opioid dependence with withdrawal: Secondary | ICD-10-CM | POA: Diagnosis present

## 2017-12-19 DIAGNOSIS — R768 Other specified abnormal immunological findings in serum: Secondary | ICD-10-CM | POA: Diagnosis present

## 2017-12-19 DIAGNOSIS — F1410 Cocaine abuse, uncomplicated: Secondary | ICD-10-CM | POA: Diagnosis present

## 2017-12-19 LAB — I-STAT CG4 LACTIC ACID, ED
Lactic Acid, Venous: 2.21 mmol/L (ref 0.5–1.9)
Lactic Acid, Venous: 2.78 mmol/L (ref 0.5–1.9)

## 2017-12-19 LAB — ETHANOL: Alcohol, Ethyl (B): <10 mg/dL (ref <10)

## 2017-12-19 LAB — COMPREHENSIVE METABOLIC PANEL
ALK PHOS: 60 U/L (ref 38–126)
ALT: 21 U/L (ref 14–54)
AST: 25 U/L (ref 15–41)
Albumin: 4.1 g/dL (ref 3.5–5.0)
Anion gap: 14 (ref 5–15)
BUN: 9 mg/dL (ref 6–20)
CALCIUM: 9.6 mg/dL (ref 8.9–10.3)
CO2: 21 mmol/L — AB (ref 22–32)
CREATININE: 0.72 mg/dL (ref 0.44–1.00)
Chloride: 102 mmol/L (ref 101–111)
GFR calc non Af Amer: >60 mL/min (ref >60)
GLUCOSE: 135 mg/dL — AB (ref 65–99)
Potassium: 3.7 mmol/L (ref 3.5–5.1)
SODIUM: 137 mmol/L (ref 135–145)
Total Bilirubin: 0.9 mg/dL (ref 0.3–1.2)
Total Protein: 8 g/dL (ref 6.5–8.1)

## 2017-12-19 LAB — I-STAT CHEM 8, ED
BUN: 9 mg/dL (ref 6–20)
CALCIUM ION: 1.14 mmol/L — AB (ref 1.15–1.40)
CHLORIDE: 103 mmol/L (ref 101–111)
CREATININE: 0.6 mg/dL (ref 0.44–1.00)
GLUCOSE: 138 mg/dL — AB (ref 65–99)
HCT: 44 % (ref 36.0–46.0)
Hemoglobin: 15 g/dL (ref 12.0–15.0)
POTASSIUM: 3.8 mmol/L (ref 3.5–5.1)
Sodium: 140 mmol/L (ref 135–145)
TCO2: 25 mmol/L (ref 22–32)

## 2017-12-19 LAB — CBC
HCT: 43 % (ref 36.0–46.0)
Hemoglobin: 14.9 g/dL (ref 12.0–15.0)
MCH: 29.8 pg (ref 26.0–34.0)
MCHC: 34.7 g/dL (ref 30.0–36.0)
MCV: 86 fL (ref 78.0–100.0)
Platelets: 330 10*3/uL (ref 150–400)
RBC: 5 MIL/uL (ref 3.87–5.11)
RDW: 13.6 % (ref 11.5–15.5)
WBC: 12 10*3/uL — AB (ref 4.0–10.5)

## 2017-12-19 LAB — I-STAT BETA HCG BLOOD, ED (MC, WL, AP ONLY): I-stat hCG, quantitative: <5 m[IU]/mL (ref <5)

## 2017-12-19 LAB — LIPASE, BLOOD: Lipase: 23 U/L (ref 11–51)

## 2017-12-19 MED ORDER — NALOXONE HCL 0.4 MG/ML IJ SOLN
0.4000 mg | Freq: Once | INTRAMUSCULAR | Status: AC
  Administered 2017-12-19: 0.4 mg via INTRAVENOUS
  Filled 2017-12-19: qty 1

## 2017-12-19 MED ORDER — ONDANSETRON HCL 4 MG/2ML IJ SOLN
INTRAMUSCULAR | Status: AC
  Filled 2017-12-19: qty 2

## 2017-12-19 MED ORDER — METHOCARBAMOL 1000 MG/10ML IJ SOLN
1000.0000 mg | Freq: Once | INTRAMUSCULAR | Status: AC
  Administered 2017-12-19: 1000 mg via INTRAMUSCULAR
  Filled 2017-12-19: qty 10

## 2017-12-19 MED ORDER — SODIUM CHLORIDE 0.9 % IV BOLUS (SEPSIS)
1000.0000 mL | Freq: Once | INTRAVENOUS | Status: AC
  Administered 2017-12-20: 1000 mL via INTRAVENOUS

## 2017-12-19 MED ORDER — SODIUM CHLORIDE 0.9 % IV BOLUS (SEPSIS)
1000.0000 mL | Freq: Once | INTRAVENOUS | Status: AC
  Administered 2017-12-19: 1000 mL via INTRAVENOUS

## 2017-12-19 MED ORDER — DICYCLOMINE HCL 10 MG/ML IM SOLN
20.0000 mg | Freq: Once | INTRAMUSCULAR | Status: AC
  Administered 2017-12-19: 20 mg via INTRAMUSCULAR
  Filled 2017-12-19: qty 2

## 2017-12-19 MED ORDER — PROMETHAZINE HCL 25 MG/ML IJ SOLN
25.0000 mg | Freq: Once | INTRAMUSCULAR | Status: AC
  Administered 2017-12-19: 25 mg via INTRAVENOUS
  Filled 2017-12-19: qty 1

## 2017-12-19 MED ORDER — ONDANSETRON HCL 4 MG/2ML IJ SOLN
4.0000 mg | Freq: Once | INTRAMUSCULAR | Status: AC
  Administered 2017-12-19: 4 mg via INTRAVENOUS

## 2017-12-19 MED ORDER — KETOROLAC TROMETHAMINE 30 MG/ML IJ SOLN
30.0000 mg | Freq: Once | INTRAMUSCULAR | Status: AC
  Administered 2017-12-19: 30 mg via INTRAVENOUS
  Filled 2017-12-19: qty 1

## 2017-12-19 MED ORDER — DIPHENHYDRAMINE HCL 50 MG/ML IJ SOLN
12.5000 mg | Freq: Once | INTRAMUSCULAR | Status: AC
  Administered 2017-12-19: 12.5 mg via INTRAVENOUS
  Filled 2017-12-19: qty 1

## 2017-12-19 NOTE — ED Notes (Signed)
Dr Rubin PayorPickering given a copy of lactic acid results 2.78

## 2017-12-19 NOTE — ED Provider Notes (Signed)
[facility] Provider Note   CSN: [id number] Arrival date & time: 12/19/17  1821     History   Chief Complaint Chief Complaint  Patient presents with  . Withdrawal  . Emesis   Level 5 caveat due to ALtered mental status HPI Diane Foley is a 32 y.o. [facility] and [facility].  Patient has history of cocaine abuse and heroin dependence.  She is currently incarcerated.  Her last heroin use was about 11 hours ago.  Patient began having vomiting.  She has had persistent vomiting and diarrhea with abdominal pain.  She arrives lethargic and answering questions minimally.  Patient states that she began vomiting this morning.  She is slow to respond and is a difficult historian.  HPI  Past Medical History:  Diagnosis Date  . Anxiety   . Bipolar 1 disorder (HCC)    bipolar  . Cocaine abuse (HCC)   . Depression   . Drug abuse (HCC)   . Opioid abuse (HCC)   . PTSD (post-traumatic stress disorder)   . Sepsis (HCC)   . Sepsis(995.91)   . Suicidal ideation   . Suicidal ideations   . UTI (urinary tract infection)     Patient Active Problem List   Diagnosis Date Noted  . Cocaine overdose, accidental or unintentional, initial encounter (HCC) 07/31/2017  . Hypokalemia 07/31/2017  . Normochromic normocytic anemia 07/31/2017  . Opioid type dependence, continuous (HCC) 02/16/2015  . MDD (major depressive disorder), severe (HCC) 02/11/2015  . Major depressive disorder without psychotic features 02/09/2015  . Mood disorder (HCC) 02/09/2015  . UTI (urinary tract infection)     Past Surgical History:  Procedure Laterality Date  . APPENDECTOMY      OB History    No data available       Home Medications    Prior to Admission medications   Medication Sig Start Date End Date Taking? Authorizing Provider  METHADONE HCL PO Take 75 mg by mouth daily.   Yes [provider]    cloNIDine (CATAPRES) 0.1 MG tablet Take 1 tablet (0.1 mg total) by mouth 2 (two) times daily. Take 1 tablet (0.1 mg) by mouth twice daily for 2 days, then 1 tablet (0.1 mg) once daily for 3 more days Patient not taking: Reported on 12/19/2017 08/02/17   Shaune PollackIsaacs, Cameron, MD  cloNIDine (CATAPRES) 0.2 MG tablet Take 1 tablet (0.2 mg total) by mouth 3 (three) times daily. Patient not taking: Reported on 12/19/2017 08/14/16   Renne CriglerGeiple, Joshua, PA-C  dicyclomine (BENTYL) 20 MG tablet Take 1 tablet (20 mg total) by mouth 4 (four) times daily -  before meals and at bedtime. 08/02/17 08/07/17  Shaune PollackIsaacs, Cameron, MD  hydrOXYzine (ATARAX/VISTARIL) 25 MG tablet Take 1 tablet (25 mg total) by mouth every 6 (six) hours. Patient not taking: Reported on 12/19/2017 08/14/16   Renne CriglerGeiple, Joshua, PA-C  hydrOXYzine (ATARAX/VISTARIL) 25 MG tablet Take 1 tablet (25 mg total) by mouth every 6 (six) hours as needed for anxiety. Patient not taking: Reported on 12/19/2017 08/02/17   Shaune PollackIsaacs, Cameron, MD  loperamide (IMODIUM) 2 MG capsule Take 1 capsule (2 mg total) by mouth 4 (four) times daily as needed for diarrhea or loose stools. Patient not taking: Reported on 12/19/2017 08/02/17   Shaune PollackIsaacs, Cameron, MD  methocarbamol (ROBAXIN) 500 MG tablet Take 1 tablet (500 mg total) by mouth 4 (four) times daily. Patient not taking: Reported on 12/19/2017 08/14/16  Renne Crigler, PA-C  ondansetron (ZOFRAN ODT) 4 MG disintegrating tablet Take 1 tablet (4 mg total) by mouth every 8 (eight) hours as needed for nausea or vomiting. Patient not taking: Reported on 12/19/2017 08/14/16   Renne Crigler, PA-C    Family History Family History  Problem Relation Age of Onset  . Cancer Mother     Social History Social History   Tobacco Use  . Smoking status: Current Every Day Smoker    Packs/day: 0.25    Types: Cigarettes  . Smokeless tobacco: Never Used  Substance Use Topics  . Alcohol use: Yes    Alcohol/week: 0.6 oz    Types: 1 Cans of beer per week     Frequency: Never    Comment: occasionally  . Drug use: Yes    Types: Heroin, IV    Comment: heroin last used 12-18-17     Allergies   Patient has no known allergies.   Review of Systems Review of Systems  Unable to perform ROS: Mental status change     Physical Exam Updated Vital Signs BP (!) 144/98   Pulse 71   Temp 98.3 F (36.8 C) (Oral)   Resp (!) 30   LMP  (LMP Unknown)   SpO2 100%   Physical Exam  Constitutional: She appears well-developed. She appears lethargic. She appears ill.  HENT:  Head: Normocephalic and atraumatic.  Eyes: Conjunctivae and EOM are normal.  Anisocoria  Neck: Normal range of motion. Neck supple.  Cardiovascular: Normal rate and regular rhythm.  Pulmonary/Chest: Effort normal and breath sounds normal.  Abdominal: Soft. Bowel sounds are normal. She exhibits no distension. There is no tenderness.  Musculoskeletal: Normal range of motion.  Neurological: She appears lethargic. GCS eye subscore is 3. GCS verbal subscore is 5. GCS motor subscore is 6.  Skin: Skin is warm and dry. Capillary refill takes less than 2 seconds.  Nursing note and vitals reviewed.    ED Treatments / Results  Labs (all labs ordered are listed, but only abnormal results are displayed) Labs Reviewed  COMPREHENSIVE METABOLIC PANEL - Abnormal; Notable for the following components:      Result Value   CO2 21 (*)    Glucose, Bld 135 (*)    All other components within normal limits  CBC - Abnormal; Notable for the following components:   WBC 12.0 (*)    All other components within normal limits  I-STAT CHEM 8, ED - Abnormal; Notable for the following components:   Glucose, Bld 138 (*)    Calcium, Ion 1.14 (*)    All other components within normal limits  I-STAT CG4 LACTIC ACID, ED - Abnormal; Notable for the following components:   Lactic Acid, Venous 2.78 (*)    All other components within normal limits  ETHANOL  LIPASE, BLOOD  RAPID URINE DRUG SCREEN, HOSP  PERFORMED  URINALYSIS, ROUTINE W REFLEX MICROSCOPIC  RAPID URINE DRUG SCREEN, HOSP PERFORMED  I-STAT BETA HCG BLOOD, ED ([facility], [facility], AP ONLY)  I-STAT CG4 LACTIC ACID, ED    EKG  EKG Interpretation None       Radiology Ct Head Wo Contrast  Result Date: 12/19/2017 CLINICAL DATA:  Lethargic EXAM: CT HEAD WITHOUT CONTRAST TECHNIQUE: Contiguous axial images were obtained from the base of the skull through the vertex without intravenous contrast. COMPARISON:  None. FINDINGS: Brain: No evidence of acute infarction, hemorrhage, hydrocephalus, extra-axial collection or mass lesion/mass effect. Vascular: No hyperdense vessel or unexpected calcification. Skull: Normal. Negative for fracture  or focal lesion. Sinuses/Orbits: Mucosal thickening in the ethmoid sinuses. Mucous retention cysts in the maxillary sinuses. No acute orbital abnormality. Other: None IMPRESSION: No CT evidence for acute intracranial abnormality. Negative non contrasted CT of the brain Electronically Signed   By: Jasmine Pang M.D.   On: 12/19/2017 21:12    Procedures Procedures (including critical care time)  Medications Ordered in ED Medications  ondansetron (ZOFRAN) 4 MG/2ML injection (not administered)  methocarbamol (ROBAXIN) injection 1,000 mg (not administered)  sodium chloride 0.9 % bolus 1,000 mL (1,000 mLs Intravenous New Bag/Given 12/19/17 2019)  promethazine (PHENERGAN) injection 25 mg (25 mg Intravenous Given 12/19/17 2019)  ondansetron (ZOFRAN) injection 4 mg (4 mg Intravenous Given 12/19/17 2047)  sodium chloride 0.9 % bolus 1,000 mL (1,000 mLs Intravenous New Bag/Given 12/19/17 2136)  ketorolac (TORADOL) 30 MG/ML injection 30 mg (30 mg Intravenous Given 12/19/17 2133)  diphenhydrAMINE (BENADRYL) injection 12.5 mg (12.5 mg Intravenous Given 12/19/17 2133)  dicyclomine (BENTYL) injection 20 mg (20 mg Intramuscular Given 12/19/17 2132)     Initial Impression / Assessment and Plan / ED Course  I have reviewed the  triage vital signs and the nursing notes.  Pertinent labs & imaging results that were available during my care of the patient were reviewed by me and considered in my medical decision making (see chart for details).  Clinical Course as of Dec 20 2218  Wynelle Link Dec 20, 2017  0014 Patient with elevated respiratory rate.  She did not have an anion gap.  EKG shows QT prolongation.  She is otherwise normal vital signs.  I have given sign out to Dr. Manus Gunning who will assume care of the patient.  [AH]    Clinical Course User Index [AH] Arthor Captain, PA-C     Final Clinical Impressions(s) / ED Diagnoses   Final diagnoses:  None    ED Discharge Orders    None       Arthor Captain, PA-C 12/20/17 2313    Benjiman Core, MD 12/21/17 0030

## 2017-12-19 NOTE — ED Notes (Signed)
Patient transported to CT 

## 2017-12-19 NOTE — ED Notes (Signed)
Dr Rubin PayorPickering given a copy of lactic acid result 2.21

## 2017-12-19 NOTE — ED Triage Notes (Signed)
[facility]  Admission date 12-18-17.  Last used heroin prior to admission.  Uses 2 - 2.5 grams heroin IV daily.  There is question of pregnancy r/t pts response to the question on admission.  No w/d medications givfen d/t questionable pregnancy.  Per notes from center unable to eat/drink.  Vomited bile last 3 hours PTA.  pt sleeping in w/c, lethargic, slow to arouse, answers questions with short answers, closes eyes.  Pt has runny nose.

## 2017-12-20 ENCOUNTER — Observation Stay (HOSPITAL_COMMUNITY)

## 2017-12-20 ENCOUNTER — Encounter (HOSPITAL_COMMUNITY): Payer: Self-pay | Admitting: Family Medicine

## 2017-12-20 DIAGNOSIS — G9340 Encephalopathy, unspecified: Secondary | ICD-10-CM | POA: Diagnosis present

## 2017-12-20 DIAGNOSIS — F319 Bipolar disorder, unspecified: Secondary | ICD-10-CM | POA: Diagnosis present

## 2017-12-20 DIAGNOSIS — F17210 Nicotine dependence, cigarettes, uncomplicated: Secondary | ICD-10-CM | POA: Diagnosis present

## 2017-12-20 DIAGNOSIS — I4581 Long QT syndrome: Secondary | ICD-10-CM | POA: Diagnosis present

## 2017-12-20 DIAGNOSIS — T401X1A Poisoning by heroin, accidental (unintentional), initial encounter: Secondary | ICD-10-CM | POA: Diagnosis present

## 2017-12-20 DIAGNOSIS — R4182 Altered mental status, unspecified: Secondary | ICD-10-CM | POA: Diagnosis present

## 2017-12-20 DIAGNOSIS — R9431 Abnormal electrocardiogram [ECG] [EKG]: Secondary | ICD-10-CM | POA: Diagnosis present

## 2017-12-20 DIAGNOSIS — N390 Urinary tract infection, site not specified: Secondary | ICD-10-CM | POA: Diagnosis present

## 2017-12-20 DIAGNOSIS — F1123 Opioid dependence with withdrawal: Secondary | ICD-10-CM | POA: Diagnosis present

## 2017-12-20 DIAGNOSIS — F1910 Other psychoactive substance abuse, uncomplicated: Secondary | ICD-10-CM | POA: Diagnosis present

## 2017-12-20 DIAGNOSIS — G92 Toxic encephalopathy: Secondary | ICD-10-CM | POA: Diagnosis present

## 2017-12-20 DIAGNOSIS — B9620 Unspecified Escherichia coli [E. coli] as the cause of diseases classified elsewhere: Secondary | ICD-10-CM | POA: Diagnosis present

## 2017-12-20 DIAGNOSIS — F1410 Cocaine abuse, uncomplicated: Secondary | ICD-10-CM | POA: Diagnosis present

## 2017-12-20 DIAGNOSIS — F1120 Opioid dependence, uncomplicated: Secondary | ICD-10-CM

## 2017-12-20 LAB — TSH: TSH: 0.388 u[IU]/mL (ref 0.350–4.500)

## 2017-12-20 LAB — CBC WITH DIFFERENTIAL/PLATELET
BASOS ABS: 0 10*3/uL (ref 0.0–0.1)
BASOS PCT: 0 %
BASOS PCT: 0 %
Basophils Absolute: 0 10*3/uL (ref 0.0–0.1)
EOS ABS: 0 10*3/uL (ref 0.0–0.7)
EOS PCT: 0 %
Eosinophils Absolute: 0 10*3/uL (ref 0.0–0.7)
Eosinophils Relative: 0 %
HCT: 40.7 % (ref 36.0–46.0)
HEMATOCRIT: 39.2 % (ref 36.0–46.0)
Hemoglobin: 13.5 g/dL (ref 12.0–15.0)
Hemoglobin: 13.1 g/dL (ref 12.0–15.0)
Lymphocytes Relative: 10 %
Lymphocytes Relative: 10 %
Lymphs Abs: 1.1 10*3/uL (ref 0.7–4.0)
Lymphs Abs: 1.2 10*3/uL (ref 0.7–4.0)
MCH: 28.6 pg (ref 26.0–34.0)
MCH: 29.5 pg (ref 26.0–34.0)
MCHC: 33.2 g/dL (ref 30.0–36.0)
MCHC: 33.4 g/dL (ref 30.0–36.0)
MCV: 86.2 fL (ref 78.0–100.0)
MCV: 88.3 fL (ref 78.0–100.0)
MONO ABS: 0.4 10*3/uL (ref 0.1–1.0)
MONO ABS: 0.4 10*3/uL (ref 0.1–1.0)
MONOS PCT: 4 %
MONOS PCT: 3 %
NEUTROS ABS: 10.4 10*3/uL — AB (ref 1.7–7.7)
Neutro Abs: 9.8 10*3/uL — ABNORMAL HIGH (ref 1.7–7.7)
Neutrophils Relative %: 86 %
Neutrophils Relative %: 87 %
PLATELETS: 257 10*3/uL (ref 150–400)
Platelets: 281 10*3/uL (ref 150–400)
RBC: 4.72 MIL/uL (ref 3.87–5.11)
RBC: 4.44 MIL/uL (ref 3.87–5.11)
RDW: 13.9 % (ref 11.5–15.5)
RDW: 14.5 % (ref 11.5–15.5)
WBC: 11.3 10*3/uL — ABNORMAL HIGH (ref 4.0–10.5)
WBC: 12.1 10*3/uL — ABNORMAL HIGH (ref 4.0–10.5)

## 2017-12-20 LAB — URINALYSIS, ROUTINE W REFLEX MICROSCOPIC
Bilirubin Urine: NEGATIVE
GLUCOSE, UA: NEGATIVE mg/dL
HGB URINE DIPSTICK: NEGATIVE
KETONES UR: 80 mg/dL — AB
NITRITE: NEGATIVE
PH: 7 (ref 5.0–8.0)
PROTEIN: 30 mg/dL — AB
Specific Gravity, Urine: 1.024 (ref 1.005–1.030)

## 2017-12-20 LAB — I-STAT ARTERIAL BLOOD GAS, ED
ACID-BASE DEFICIT: 1 mmol/L (ref 0.0–2.0)
Bicarbonate: 20.9 mmol/L (ref 20.0–28.0)
O2 Saturation: 98 %
PH ART: 7.494 — AB (ref 7.350–7.450)
PO2 ART: 95 mmHg (ref 83.0–108.0)
TCO2: 22 mmol/L (ref 22–32)
pCO2 arterial: 27.2 mmHg — ABNORMAL LOW (ref 32.0–48.0)

## 2017-12-20 LAB — RAPID URINE DRUG SCREEN, HOSP PERFORMED
Amphetamines: NOT DETECTED
BARBITURATES: NOT DETECTED
Benzodiazepines: NOT DETECTED
Cocaine: POSITIVE — AB
Opiates: POSITIVE — AB
Tetrahydrocannabinol: NOT DETECTED

## 2017-12-20 LAB — BASIC METABOLIC PANEL
Anion gap: 11 (ref 5–15)
BUN: 9 mg/dL (ref 6–20)
CALCIUM: 8.8 mg/dL — AB (ref 8.9–10.3)
CO2: 20 mmol/L — ABNORMAL LOW (ref 22–32)
Chloride: 108 mmol/L (ref 101–111)
Creatinine, Ser: 0.71 mg/dL (ref 0.44–1.00)
GFR calc Af Amer: >60 mL/min (ref >60)
GFR calc non Af Amer: >60 mL/min (ref >60)
GLUCOSE: 119 mg/dL — AB (ref 65–99)
POTASSIUM: 3.4 mmol/L — AB (ref 3.5–5.1)
SODIUM: 139 mmol/L (ref 135–145)

## 2017-12-20 LAB — AMMONIA: Ammonia: 15 umol/L (ref 9–35)

## 2017-12-20 LAB — MAGNESIUM: MAGNESIUM: 1.7 mg/dL (ref 1.7–2.4)

## 2017-12-20 LAB — ACETAMINOPHEN LEVEL

## 2017-12-20 LAB — SALICYLATE LEVEL

## 2017-12-20 LAB — HIV ANTIBODY (ROUTINE TESTING W REFLEX): HIV Screen 4th Generation wRfx: NONREACTIVE

## 2017-12-20 LAB — GLUCOSE, CAPILLARY: Glucose-Capillary: 109 mg/dL — ABNORMAL HIGH (ref 65–99)

## 2017-12-20 LAB — CK: Total CK: 178 U/L (ref 38–234)

## 2017-12-20 LAB — MRSA PCR SCREENING: MRSA BY PCR: NEGATIVE

## 2017-12-20 LAB — VITAMIN B12: VITAMIN B 12: 519 pg/mL (ref 180–914)

## 2017-12-20 LAB — RPR: RPR: NONREACTIVE

## 2017-12-20 MED ORDER — KETOROLAC TROMETHAMINE 30 MG/ML IJ SOLN
30.0000 mg | Freq: Four times a day (QID) | INTRAMUSCULAR | Status: DC | PRN
Start: 2017-12-20 — End: 2017-12-23

## 2017-12-20 MED ORDER — CLONIDINE HCL 0.1 MG PO TABS: 0.1000 mg | ORAL_TABLET | Freq: Three times a day (TID) | ORAL | Status: DC

## 2017-12-20 MED ORDER — LORAZEPAM 2 MG/ML IJ SOLN
0.5000 mg | INTRAMUSCULAR | Status: DC | PRN
  Administered 2017-12-20 – 2017-12-23 (×5): 0.5 mg via INTRAVENOUS
  Filled 2017-12-20 (×5): qty 1

## 2017-12-20 MED ORDER — METHADONE HCL 10 MG/ML IJ SOLN: 10.0000 mg | Freq: Once | INTRAMUSCULAR | Status: DC

## 2017-12-20 MED ORDER — CLONIDINE HCL 0.1 MG PO TABS
0.1000 mg | ORAL_TABLET | Freq: Every day | ORAL | Status: DC
  Administered 2017-12-22 – 2017-12-23 (×2): 0.1 mg via ORAL
  Filled 2017-12-20 (×2): qty 1

## 2017-12-20 MED ORDER — ENOXAPARIN SODIUM 40 MG/0.4ML ~~LOC~~ SOLN
40.0000 mg | SUBCUTANEOUS | Status: DC
  Administered 2017-12-20 – 2017-12-23 (×4): 40 mg via SUBCUTANEOUS
  Filled 2017-12-20 (×5): qty 0.4

## 2017-12-20 MED ORDER — POTASSIUM CHLORIDE IN NACL 20-0.9 MEQ/L-% IV SOLN
INTRAVENOUS | Status: AC
  Administered 2017-12-20: 21:00:00 via INTRAVENOUS
  Filled 2017-12-20 (×3): qty 1000

## 2017-12-20 MED ORDER — CLONIDINE HCL 0.1 MG PO TABS
0.1000 mg | ORAL_TABLET | Freq: Two times a day (BID) | ORAL | Status: AC
  Administered 2017-12-20 – 2017-12-21 (×4): 0.1 mg via ORAL
  Filled 2017-12-20 (×4): qty 1

## 2017-12-20 MED ORDER — MAGNESIUM SULFATE 2 GM/50ML IV SOLN
2.0000 g | Freq: Once | INTRAVENOUS | Status: AC
  Administered 2017-12-20: 2 g via INTRAVENOUS
  Filled 2017-12-20: qty 50

## 2017-12-20 MED ORDER — ACETAMINOPHEN 325 MG PO TABS: 650.0000 mg | ORAL_TABLET | Freq: Four times a day (QID) | ORAL | Status: DC | PRN

## 2017-12-20 MED ORDER — POTASSIUM CHLORIDE IN NACL 20-0.9 MEQ/L-% IV SOLN
INTRAVENOUS | Status: AC
  Administered 2017-12-20: 04:00:00 via INTRAVENOUS
  Filled 2017-12-20 (×2): qty 1000

## 2017-12-20 MED ORDER — DICYCLOMINE HCL 20 MG PO TABS
20.0000 mg | ORAL_TABLET | Freq: Three times a day (TID) | ORAL | Status: DC
  Administered 2017-12-20: 20 mg via ORAL
  Filled 2017-12-20: qty 1

## 2017-12-20 MED ORDER — ACETAMINOPHEN 650 MG RE SUPP: 650.0000 mg | Freq: Four times a day (QID) | RECTAL | Status: DC | PRN

## 2017-12-20 MED ORDER — DICYCLOMINE HCL 10 MG PO CAPS
10.0000 mg | ORAL_CAPSULE | Freq: Three times a day (TID) | ORAL | Status: DC
  Administered 2017-12-20 – 2017-12-22 (×6): 10 mg via ORAL
  Filled 2017-12-20 (×5): qty 1

## 2017-12-20 NOTE — H&P (Signed)
History and Physical    Diane ShellHolly Foley WUJ:[id number] DOB: Dec 31, 1985 DOA: 12/19/2017  PCP: Patient, No Pcp Per   [facility] from: [facility]   Chief Complaint: Malaise, vomiting, "withdrawing really bad"   HPI: Diane ShellHolly Foley is a 32 y.o. female with medical history significant for depression, anxiety, and polysubstance abuse, now presenting to the emergency department for evaluation of general malaise with vomiting.  Patient is dependent on opiates and also abuses cocaine, reporting her last opiate use at approximately 8 AM.  She has since been in the custody of department of corrections and noted to develop nonbloody vomiting and restlessness.  Patient complains of general malaise and "withdrawing really bad."  She reports a history of similar symptoms with opiate withdrawal.  Denies any recent fall or trauma.  Denies focal numbness or weakness.  ED Course: Upon arrival to the ED, patient is found to have a temp of 37.9 C, tachypnea, saturating well on room air, and with vitals otherwise normal.  EKG features a sinus rhythm with QTc interval prolonged to 638 ms.  Noncontrast head CT is negative for acute intracranial abnormality.  Chemistry panel is unremarkable and CBC features a leukocytosis to 12,000.  Lactic acid is mildly elevated.  Acetaminophen, salicylate, and ethanol levels are undetectable.  Patient was given 3 L of normal saline, Toradol, Robaxin, Bentyl, Benadryl, and Narcan in the emergency department.  She remains restless, in obvious discomfort, and will be observed on the telemetry unit for ongoing evaluation and management of acute encephalopathy, likely related to polysubstance abuse.   Review of Systems:  All other systems reviewed and apart from HPI, are negative.  Past Medical History:  Diagnosis Date  . Anxiety   . Bipolar 1 disorder (HCC)    bipolar  . Cocaine abuse (HCC)   . Depression   . Drug abuse (HCC)   . Opioid abuse (HCC)   . PTSD (post-traumatic  stress disorder)   . Sepsis (HCC)   . Sepsis(995.91)   . Suicidal ideation   . Suicidal ideations   . UTI (urinary tract infection)     Past Surgical History:  Procedure Laterality Date  . APPENDECTOMY       reports that she has been smoking cigarettes.  She has been smoking about 0.25 packs per day. she has never used smokeless tobacco. She reports that she drinks about 0.6 oz of alcohol per week. She reports that she uses drugs. Drugs: Heroin and IV.  No Known Allergies  Family History  Problem Relation Age of Onset  . Cancer Mother      Prior to Admission medications   Medication Sig Start Date End Date Taking? Authorizing Provider  METHADONE HCL PO Take 75 mg by mouth daily.   Yes [provider]  cloNIDine (CATAPRES) 0.1 MG tablet Take 1 tablet (0.1 mg total) by mouth 2 (two) times daily. Take 1 tablet (0.1 mg) by mouth twice daily for 2 days, then 1 tablet (0.1 mg) once daily for 3 more days Patient not taking: Reported on 12/19/2017 08/02/17   Shaune PollackIsaacs, Cameron, MD  cloNIDine (CATAPRES) 0.2 MG tablet Take 1 tablet (0.2 mg total) by mouth 3 (three) times daily. Patient not taking: Reported on 12/19/2017 08/14/16   Renne CriglerGeiple, Joshua, PA-C  dicyclomine (BENTYL) 20 MG tablet Take 1 tablet (20 mg total) by mouth 4 (four) times daily -  before meals and at bedtime. 08/02/17 08/07/17  Shaune PollackIsaacs, Cameron, MD  hydrOXYzine (ATARAX/VISTARIL) 25 MG tablet Take 1 tablet (25  mg total) by mouth every 6 (six) hours. Patient not taking: Reported on 12/19/2017 08/14/16   Renne Crigler, PA-C  hydrOXYzine (ATARAX/VISTARIL) 25 MG tablet Take 1 tablet (25 mg total) by mouth every 6 (six) hours as needed for anxiety. Patient not taking: Reported on 12/19/2017 08/02/17   Shaune Pollack, MD  loperamide (IMODIUM) 2 MG capsule Take 1 capsule (2 mg total) by mouth 4 (four) times daily as needed for diarrhea or loose stools. Patient not taking: Reported on 12/19/2017 08/02/17   Shaune Pollack, MD  methocarbamol  (ROBAXIN) 500 MG tablet Take 1 tablet (500 mg total) by mouth 4 (four) times daily. Patient not taking: Reported on 12/19/2017 08/14/16   Renne Crigler, PA-C  ondansetron (ZOFRAN ODT) 4 MG disintegrating tablet Take 1 tablet (4 mg total) by mouth every 8 (eight) hours as needed for nausea or vomiting. Patient not taking: Reported on 12/19/2017 08/14/16   Renne Crigler, PA-C    Physical Exam: Vitals:   12/20/17 0100 12/20/17 0130 12/20/17 0131 12/20/17 0230  BP: 137/84 (!) 149/99  138/82  Pulse: 74 80  74  Resp: (!) 23 (!) 45  (!) 40  Temp:   100.2 F (37.9 C)   TempSrc:   Rectal   SpO2: 99% 99%  98%      Constitutional: NAD, restless, in obvious discomfort, writhing in bed Eyes: PERTLA, lids and conjunctivae normal ENMT: Mucous membranes are dry. Posterior pharynx clear of any exudate or lesions.   Neck: normal, supple, no masses, no thyromegaly Respiratory: clear to auscultation bilaterally, no wheezing, no crackles. Mild tachypnea. Normal respiratory effort.   Cardiovascular: S1 & S2 heard, regular rate and rhythm. No extremity edema. No significant JVD. Abdomen: No distension, no tenderness, no masses palpated. Bowel sounds normal.  Musculoskeletal: no clubbing / cyanosis. No joint deformity upper and lower extremities.   Skin: no significant rashes, lesions, ulcers. Poor turgor. Neurologic: CN 2-12 grossly intact. Sensation intact. Moving all extremities.  Psychiatric: Occasionally will make eye-contact and answer questions appropriately, but otherwise keeps eyes closed and does not respond. Restless.      Labs on Admission: I have personally reviewed following labs and imaging studies  CBC: Recent Labs  Lab 12/19/17 1918 12/19/17 1938 12/20/17 0215  WBC 12.0*  --  11.3*  NEUTROABS  --   --  9.8*  HGB 14.9 15.0 13.5  HCT 43.0 44.0 40.7  MCV 86.0  --  86.2  PLT 330  --  281   Basic Metabolic Panel: Recent Labs  Lab 12/19/17 1918 12/19/17 1938  NA 137 140  K 3.7  3.8  CL 102 103  CO2 21*  --   GLUCOSE 135* 138*  BUN 9 9  CREATININE 0.72 0.60  CALCIUM 9.6  --    GFR: CrCl cannot be calculated (Unknown ideal weight.). Liver Function Tests: Recent Labs  Lab 12/19/17 1918  AST 25  ALT 21  ALKPHOS 60  BILITOT 0.9  PROT 8.0  ALBUMIN 4.1   Recent Labs  Lab 12/19/17 1918  LIPASE 23   No results for input(s): AMMONIA in the last 168 hours. Coagulation Profile: No results for input(s): INR, PROTIME in the last 168 hours. Cardiac Enzymes: Recent Labs  Lab 12/20/17 0037  CKTOTAL 178   BNP (last 3 results) No results for input(s): PROBNP in the last 8760 hours. HbA1C: No results for input(s): HGBA1C in the last 72 hours. CBG: No results for input(s): GLUCAP in the last 168 hours. Lipid Profile: No  results for input(s): CHOL, HDL, LDLCALC, TRIG, CHOLHDL, LDLDIRECT in the last 72 hours. Thyroid Function Tests: No results for input(s): TSH, T4TOTAL, FREET4, T3FREE, THYROIDAB in the last 72 hours. Anemia Panel: No results for input(s): VITAMINB12, FOLATE, FERRITIN, TIBC, IRON, RETICCTPCT in the last 72 hours. Urine analysis:    Component Value Date/Time   COLORURINE AMBER (A) 12/20/2017 0130   APPEARANCEUR CLOUDY (A) 12/20/2017 0130   APPEARANCEUR Hazy 04/03/2014 2109   LABSPEC 1.024 12/20/2017 0130   LABSPEC 1.014 04/03/2014 2109   PHURINE 7.0 12/20/2017 0130   GLUCOSEU NEGATIVE 12/20/2017 0130   GLUCOSEU Negative 04/03/2014 2109   HGBUR NEGATIVE 12/20/2017 0130   BILIRUBINUR NEGATIVE 12/20/2017 0130   BILIRUBINUR Negative 04/03/2014 2109   KETONESUR 80 (A) 12/20/2017 0130   PROTEINUR 30 (A) 12/20/2017 0130   UROBILINOGEN 4.0 (H) 06/13/2013 1953   NITRITE NEGATIVE 12/20/2017 0130   LEUKOCYTESUR SMALL (A) 12/20/2017 0130   LEUKOCYTESUR Trace 04/03/2014 2109   Sepsis Labs: @LABRCNTIP (procalcitonin:4,lacticidven:4) )No results found for this or any previous visit (from the past 240 hour(s)).   Radiological Exams on  Admission: Ct Head Wo Contrast  Result Date: 12/19/2017 CLINICAL DATA:  Lethargic EXAM: CT HEAD WITHOUT CONTRAST TECHNIQUE: Contiguous axial images were obtained from the base of the skull through the vertex without intravenous contrast. COMPARISON:  None. FINDINGS: Brain: No evidence of acute infarction, hemorrhage, hydrocephalus, extra-axial collection or mass lesion/mass effect. Vascular: No hyperdense vessel or unexpected calcification. Skull: Normal. Negative for fracture or focal lesion. Sinuses/Orbits: Mucosal thickening in the ethmoid sinuses. Mucous retention cysts in the maxillary sinuses. No acute orbital abnormality. Other: None IMPRESSION: No CT evidence for acute intracranial abnormality. Negative non contrasted CT of the brain Electronically Signed   By: Jasmine Pang M.D.   On: 12/19/2017 21:12    EKG: Independently reviewed. Sinus rhythm, QTc 638 ms.   Assessment/Plan  1. Acute encephalopathy  - Presents with malaise and vomiting, restless and writhing in bed, but only opens eyes and converses intermittently  - Head CT negative for acute intracranial abnormality and no focal deficits found on exam  - Suspect this is related to her polysubstance abuse, possibly opiate withdrawal - Check TSH, ammonia, B12, folate, RPR - Continue supportive care    2. Polysubstance abuse  - Reports opiate use ~8 am on day of presentation - Also has hx of cocaine abuse  - Counseled toward cessation  - Social work consultation requested    3. Prolonged QT interval  - QTc 638 on admission  - Continue cardiac monitoring, replace K to 4 and mag to 2, avoid offending agents    DVT prophylaxis: Lovenox Code Status: Full  Family Communication: Discussed with patient Disposition Plan: Observe on telemetry Consults called: None Admission status: Observation    Briscoe Deutscher, MD [facility] Pager [phone]  If 7PM-7AM, please contact night-coverage [email or web address] Password  [email or web address]  12/20/2017, 2:52 AM

## 2017-12-20 NOTE — Progress Notes (Signed)
Patient is vomited and dark green  in color .

## 2017-12-20 NOTE — ED Notes (Signed)
After narcan given, pt moving around in bed more, sniffling. VSS. Will open eyes and talk when ask to. A&Ox 4 at this time. Officer at bedside. Pt states that she doesn't feel like she needs to urinate at this time. Will I&O cath pt.

## 2017-12-20 NOTE — ED Notes (Signed)
Pt rolling around in bed. States that she just can't get comfortable.

## 2017-12-20 NOTE — Progress Notes (Signed)
Patient is non-compliant with following directions, continues to sink down in bed.  Patient still vomiting and will not hold container to mouth, incontinent of bowel and bladder.  While cleaning up the patient a rash was noted on her right arm that was not present at admission.  Will continue to monitor.

## 2017-12-20 NOTE — Progress Notes (Signed)
PROGRESS NOTE    Diane Foley  ZOX:[id number] DOB: [birth year] DOA: 12/19/2017 PCP: Patient, No Pcp Per  Brief Narrative: Ms. Diane Foley reverses 33 year old female brought to the emergency room by [facility] officers for nausea and vomiting, she has a history of polysubstance abuse and heroin dependence/active IV drug abuse, last use being 1/12 at 8 AM. Patient unable to provide any history she is confused, slow to respond an extremely difficult historian. She was suspected to be withdrawing from opiates and given them till Robaxin and Toradol and Narcan in the emergency room  Assessment & Plan:   1. Acute metabolic encephalopathy -Likely related to polysubstance abuse versus withdrawal -She is also at risk for bacteremia/endocarditis will check blood cultures -Supportive care with IV fluids, antiemetics and clonidine for withdrawal -Clear liquid diet -Further management his condition evolves  2. Nausea and vomiting -Likely related to withdrawal versus infectious etiology -No vomiting noted this morning, start clears, supportive care and monitor -Follow-up blood cultures -Urine hCG less than 5  3. Prolonged QTC -Likely related to substance abuse/heroin -Repeat EKG in a.m. -Keep potassium greater than 4 and mag around 2  4. Polysubstance abuse -History of heroin dependence and cocaine abuse -Per ED triage notes uses 2-2.5 g of heroin IV daily -Social work consult been more awake   DVT prophylaxis: Lovenox  Code Status:  full code  Family Communication: no family, Public relations account executive at bedside  Disposition Plan: Pending clinical improvement  Consultants:   None    Procedures:   Antimicrobials:    Subjective: -No vomiting noted, remains confused, mumbles and says a few words here and there   Objective: Vitals:   12/20/17 0330 12/20/17 0400 12/20/17 0426 12/20/17 1100  BP: (!) 138/94 (!) 138/92 (!) 146/84   Pulse: 71 72 74   Resp: (!) 35 (!) 31 18     Temp:   98.6 F (37 C)   TempSrc:   Oral   SpO2: 100% 100% 100% 98%  Weight:   46.7 kg (102 lb 15.3 oz)   Height:   5\' 3"  (1.6 m)     Intake/Output Summary (Last 24 hours) at 12/20/2017 1154 Last data filed at 12/20/2017 8119 Gross per 24 hour  Intake 2247.92 ml  Output -  Net 2247.92 ml   Filed Weights   12/20/17 0426  Weight: 46.7 kg (102 lb 15.3 oz)    Examination:  General exam: somnolent, arousable, mumbles a few words, confused  Respiratory system: clear anteriorly  Cardiovascular system: S1 & S2 heard, RRR. No JVD, murmurs Gastrointestinal system: Abdomen is nondistended, soft and nontender.Normal bowel sounds heard. Central nervous system: Confused, moves all extremities no localizing signs  Extremities: no edema, track marks noted  Skin: IV track sites noted throughout arms  Psychiatry: Confused, unable to assess    Data Reviewed:   CBC: Recent Labs  Lab 12/19/17 1918 12/19/17 1938 12/20/17 0215 12/20/17 0334  WBC 12.0*  --  11.3* 12.1*  NEUTROABS  --   --  9.8* 10.4*  HGB 14.9 15.0 13.5 13.1  HCT 43.0 44.0 40.7 39.2  MCV 86.0  --  86.2 88.3  PLT 330  --  281 257   Basic Metabolic Panel: Recent Labs  Lab 12/19/17 1918 12/19/17 1938 12/20/17 0334  NA 137 140 139  K 3.7 3.8 3.4*  CL 102 103 108  CO2 21*  --  20*  GLUCOSE 135* 138* 119*  BUN 9 9 9   CREATININE 0.72 0.60 0.71  CALCIUM  9.6  --  8.8*  MG  --   --  1.7   GFR: Estimated Creatinine Clearance: 75.8 mL/min (by C-G formula based on SCr of 0.71 mg/dL). Liver Function Tests: Recent Labs  Lab 12/19/17 1918  AST 25  ALT 21  ALKPHOS 60  BILITOT 0.9  PROT 8.0  ALBUMIN 4.1   Recent Labs  Lab 12/19/17 1918  LIPASE 23   Recent Labs  Lab 12/20/17 0334  AMMONIA 15   Coagulation Profile: No results for input(s): INR, PROTIME in the last 168 hours. Cardiac Enzymes: Recent Labs  Lab 12/20/17 0037  CKTOTAL 178   BNP (last 3 results) No results for input(s): PROBNP in the  last 8760 hours. HbA1C: No results for input(s): HGBA1C in the last 72 hours. CBG: Recent Labs  Lab 12/20/17 0748  GLUCAP 109*   Lipid Profile: No results for input(s): CHOL, HDL, LDLCALC, TRIG, CHOLHDL, LDLDIRECT in the last 72 hours. Thyroid Function Tests: Recent Labs    12/20/17 0333  TSH 0.388   Anemia Panel: Recent Labs    12/20/17 0334  VITAMINB12 519   Urine analysis:    Component Value Date/Time   COLORURINE AMBER (A) 12/20/2017 0130   APPEARANCEUR CLOUDY (A) 12/20/2017 0130   APPEARANCEUR Hazy 04/03/2014 2109   LABSPEC 1.024 12/20/2017 0130   LABSPEC 1.014 04/03/2014 2109   PHURINE 7.0 12/20/2017 0130   GLUCOSEU NEGATIVE 12/20/2017 0130   GLUCOSEU Negative 04/03/2014 2109   HGBUR NEGATIVE 12/20/2017 0130   BILIRUBINUR NEGATIVE 12/20/2017 0130   BILIRUBINUR Negative 04/03/2014 2109   KETONESUR 80 (A) 12/20/2017 0130   PROTEINUR 30 (A) 12/20/2017 0130   UROBILINOGEN 4.0 (H) 06/13/2013 1953   NITRITE NEGATIVE 12/20/2017 0130   LEUKOCYTESUR SMALL (A) 12/20/2017 0130   LEUKOCYTESUR Trace 04/03/2014 2109   Sepsis Labs: @LABRCNTIP (procalcitonin:4,lacticidven:4)  ) Recent Results (from the past 240 hour(s))  MRSA PCR Screening     Status: None   Collection Time: 12/20/17  4:36 AM  Result Value Ref Range Status   MRSA by PCR NEGATIVE NEGATIVE Final    Comment:        The GeneXpert MRSA Assay (FDA approved for NASAL specimens only), is one component of a comprehensive MRSA colonization surveillance program. It is not intended to diagnose MRSA infection nor to guide or monitor treatment for MRSA infections.          Radiology Studies: Dg Chest 2 View  Result Date: 12/20/2017 CLINICAL DATA:  Nonresponsive, tachypnea EXAM: CHEST  2 VIEW COMPARISON:  04/03/2014 FINDINGS: The heart size and mediastinal contours are within normal limits. Both lungs are clear. The visualized skeletal structures are unremarkable. IMPRESSION: No active cardiopulmonary  disease. Electronically Signed   By: Jasmine PangKim  Fujinaga M.D.   On: 12/20/2017 03:26   Ct Head Wo Contrast  Result Date: 12/19/2017 CLINICAL DATA:  Lethargic EXAM: CT HEAD WITHOUT CONTRAST TECHNIQUE: Contiguous axial images were obtained from the base of the skull through the vertex without intravenous contrast. COMPARISON:  None. FINDINGS: Brain: No evidence of acute infarction, hemorrhage, hydrocephalus, extra-axial collection or mass lesion/mass effect. Vascular: No hyperdense vessel or unexpected calcification. Skull: Normal. Negative for fracture or focal lesion. Sinuses/Orbits: Mucosal thickening in the ethmoid sinuses. Mucous retention cysts in the maxillary sinuses. No acute orbital abnormality. Other: None IMPRESSION: No CT evidence for acute intracranial abnormality. Negative non contrasted CT of the brain Electronically Signed   By: Jasmine PangKim  Fujinaga M.D.   On: 12/19/2017 21:12  Scheduled Meds: . cloNIDine  0.1 mg Oral BID  . [START ON 12/22/2017] cloNIDine  0.1 mg Oral Daily  . dicyclomine  10 mg Oral TID AC  . enoxaparin (LOVENOX) injection  40 mg Subcutaneous Q24H   Continuous Infusions: . 0.9 % NaCl with KCl 20 mEq / L 125 mL/hr at 12/20/17 0429     LOS: 0 days    Time spent:    Zannie Cove, MD [facility] Page via [email or web address], password [email or web address] After 7PM please contact night-coverage  12/20/2017, 11:54 AM

## 2017-12-20 NOTE — ED Notes (Signed)
Patient transported to X-ray. Police with pt.

## 2017-12-20 NOTE — ED Provider Notes (Signed)
Care assumed from PA C Harris at shift change.  [facility] from [facility] with nausea vomiting, and lethargy.  History of heroin abuse last use around 8 AM.  Denies any other ingestions.  Patient with persistent tachypnea and hypertension.  She remains obtunded and difficult to arouse.  Labs show normal anion gap.  Negative salicylate and acetaminophen levels. negative alcohol.  Patient remains obtunded, protecting airway.  Remains tachypneic.  Will check chest x-ray.  Drug screen positive for opiates and cocaine only.  Normal anion gap on repeat labs.  Observation admission for altered mental status discussed with Dr.Opyd.   Diane Foley, Diane Kihn, MD 12/20/17 [phone]

## 2017-12-20 NOTE — ED Notes (Signed)
Lab at bedside drawing blood. Pt states that she can get up to urinate.

## 2017-12-20 NOTE — ED Notes (Signed)
Called main lab abt blood that was sent  at 0040.  Blood is there

## 2017-12-20 NOTE — ED Notes (Signed)
Police released shackles and handcuffs. Pt up to restroom. Steady on feet. States that she still feels horrible. Pt urinated and had BM.

## 2017-12-20 NOTE — ED Notes (Signed)
Admitting md at bedside

## 2017-12-20 NOTE — Progress Notes (Signed)
Patient arrived via stretcher from the emergency room accompanied by a Emergency planning/management officersheriff officer.  Patient has IV in LAC and right foot.  Patient has to be coached several times to answer questions.  Placed on box 22 tele.  Skin intact except for track marks on bilateral upper extremities.

## 2017-12-21 ENCOUNTER — Other Ambulatory Visit: Payer: Self-pay

## 2017-12-21 DIAGNOSIS — R112 Nausea with vomiting, unspecified: Secondary | ICD-10-CM

## 2017-12-21 DIAGNOSIS — F1123 Opioid dependence with withdrawal: Secondary | ICD-10-CM

## 2017-12-21 DIAGNOSIS — R197 Diarrhea, unspecified: Secondary | ICD-10-CM

## 2017-12-21 LAB — HEPATITIS PANEL, ACUTE
HCV Ab: >11 s/co ratio — ABNORMAL HIGH (ref 0.0–0.9)
Hep A IgM: NEGATIVE
Hep B C IgM: NEGATIVE
Hepatitis B Surface Ag: NEGATIVE

## 2017-12-21 LAB — GLUCOSE, CAPILLARY: GLUCOSE-CAPILLARY: 105 mg/dL — AB (ref 65–99)

## 2017-12-21 MED ORDER — DEXTROSE 5 % IV SOLN
1.0000 g | INTRAVENOUS | Status: DC
  Administered 2017-12-21 – 2017-12-22 (×2): 1 g via INTRAVENOUS
  Filled 2017-12-21 (×3): qty 10

## 2017-12-21 MED ORDER — POTASSIUM CHLORIDE IN NACL 20-0.9 MEQ/L-% IV SOLN
INTRAVENOUS | Status: AC
  Administered 2017-12-21 – 2017-12-22 (×2): via INTRAVENOUS
  Filled 2017-12-21 (×2): qty 1000

## 2017-12-21 NOTE — Progress Notes (Addendum)
PROGRESS NOTE    Diane Foley  WUJ:[id number] DOB: August 23, 1986 DOA: 12/19/2017 PCP: Patient, No Pcp Per  Brief Narrative: Ms. [name] reverses 32 year old female brought to the emergency room by [facility] officers for nausea and vomiting, she has a history of polysubstance abuse and heroin dependence/active IV drug abuse, last use being 1/12 at 8 AM. Patient unable to provide any history she is confused, slow to respond an extremely difficult historian. She was suspected to be withdrawing from opiates and given them till Robaxin and Toradol and Narcan in the emergency room  Assessment & Plan:   1. Acute metabolic encephalopathy -Likely related to heroin withdrawal vs UTI -mentation much improved -at risk for bacteremia/endocarditis, Blood cultures negative so far, urine Cx with 100K GNR-will start IV ceftriaxone -Continue supportive care with IV fluids, antiemetics -continue clonidine scheduled for withdrawal  -Advance diet as tolerated   2. Nausea and vomiting -Likely related to withdrawal versus UTI -Starting IV ceftriaxone -Clear liquid diet, advance as tolerated  -Blood cultures no growth to date  -Urine hCG less than 5  3. Prolonged QTC -Likely related to substance abuse/heroin -Repeat EKG today  4. Polysubstance abuse -History of heroin dependence and cocaine abuse -Per ED triage notes uses 2-2.5 g of heroin IV daily -HIV negative, hepatitis panel pending -Social work consult been more awake   DVT prophylaxis: Lovenox  Code Status:  full code  Family Communication: no family, Public relations account executive at bedside  Disposition Plan: Pending clinical improvement  Consultants:   None    Procedures:   Antimicrobials:    Subjective: -She had an episode of vomiting last night however feels overall better today -Still nauseated not able to eat much this morning  Objective: Vitals:   12/20/17 1100 12/20/17 1539 12/20/17 2226 12/21/17 0624  BP:  (!)  148/92 (!) 157/100 (!) 154/97  Pulse:  66 (!) 57 68  Resp:      Temp:  98.8 F (37.1 C) 97.8 F (36.6 C) 98.5 F (36.9 C)  TempSrc:  Oral Oral Oral  SpO2: 98% 100% 100% 100%  Weight:      Height:       No intake or output data in the 24 hours ending 12/21/17 1329 Filed Weights   12/20/17 0426  Weight: 46.7 kg (102 lb 15.3 oz)    Examination:  Gen: Somnolent, easily arousable, much more lucid today HEENT: PERRLA, Neck supple, no JVD Lungs: Good air movement bilaterally, CTAB CVS: RRR,No Gallops,Rubs or new Murmurs Abd: soft, Non tender, non distended, BS present Extremities: No Cyanosis, Clubbing or edema Skin: IV track marks throughout arms Psych: Appropriate mood and affect this morning  Data Reviewed:   CBC: Recent Labs  Lab 12/19/17 1918 12/19/17 1938 12/20/17 0215 12/20/17 0334  WBC 12.0*  --  11.3* 12.1*  NEUTROABS  --   --  9.8* 10.4*  HGB 14.9 15.0 13.5 13.1  HCT 43.0 44.0 40.7 39.2  MCV 86.0  --  86.2 88.3  PLT 330  --  281 257   Basic Metabolic Panel: Recent Labs  Lab 12/19/17 1918 12/19/17 1938 12/20/17 0334  NA 137 140 139  K 3.7 3.8 3.4*  CL 102 103 108  CO2 21*  --  20*  GLUCOSE 135* 138* 119*  BUN 9 9 9   CREATININE 0.72 0.60 0.71  CALCIUM 9.6  --  8.8*  MG  --   --  1.7   GFR: Estimated Creatinine Clearance: 75.8 mL/min (by C-G formula based on  SCr of 0.71 mg/dL). Liver Function Tests: Recent Labs  Lab 12/19/17 1918  AST 25  ALT 21  ALKPHOS 60  BILITOT 0.9  PROT 8.0  ALBUMIN 4.1   Recent Labs  Lab 12/19/17 1918  LIPASE 23   Recent Labs  Lab 12/20/17 0334  AMMONIA 15   Coagulation Profile: No results for input(s): INR, PROTIME in the last 168 hours. Cardiac Enzymes: Recent Labs  Lab 12/20/17 0037  CKTOTAL 178   BNP (last 3 results) No results for input(s): PROBNP in the last 8760 hours. HbA1C: No results for input(s): HGBA1C in the last 72 hours. CBG: Recent Labs  Lab 12/20/17 0748 12/21/17 1215  GLUCAP  109* 105*   Lipid Profile: No results for input(s): CHOL, HDL, LDLCALC, TRIG, CHOLHDL, LDLDIRECT in the last 72 hours. Thyroid Function Tests: Recent Labs    12/20/17 0333  TSH 0.388   Anemia Panel: Recent Labs    12/20/17 0334  VITAMINB12 519   Urine analysis:    Component Value Date/Time   COLORURINE AMBER (A) 12/20/2017 0130   APPEARANCEUR CLOUDY (A) 12/20/2017 0130   APPEARANCEUR Hazy 04/03/2014 2109   LABSPEC 1.024 12/20/2017 0130   LABSPEC 1.014 04/03/2014 2109   PHURINE 7.0 12/20/2017 0130   GLUCOSEU NEGATIVE 12/20/2017 0130   GLUCOSEU Negative 04/03/2014 2109   HGBUR NEGATIVE 12/20/2017 0130   BILIRUBINUR NEGATIVE 12/20/2017 0130   BILIRUBINUR Negative 04/03/2014 2109   KETONESUR 80 (A) 12/20/2017 0130   PROTEINUR 30 (A) 12/20/2017 0130   UROBILINOGEN 4.0 (H) 06/13/2013 1953   NITRITE NEGATIVE 12/20/2017 0130   LEUKOCYTESUR SMALL (A) 12/20/2017 0130   LEUKOCYTESUR Trace 04/03/2014 2109   Sepsis Labs: @LABRCNTIP (procalcitonin:4,lacticidven:4)  ) Recent Results (from the past 240 hour(s))  Urine culture     Status: Abnormal (Preliminary result)   Collection Time: 12/20/17  1:30 AM  Result Value Ref Range Status   Specimen Description URINE, RANDOM  Final   Special Requests NONE  Final   Culture >=100,000 COLONIES/mL GRAM NEGATIVE RODS (A)  Final   Report Status PENDING  Incomplete  MRSA PCR Screening     Status: None   Collection Time: 12/20/17  4:36 AM  Result Value Ref Range Status   MRSA by PCR NEGATIVE NEGATIVE Final    Comment:        The GeneXpert MRSA Assay (FDA approved for NASAL specimens only), is one component of a comprehensive MRSA colonization surveillance program. It is not intended to diagnose MRSA infection nor to guide or monitor treatment for MRSA infections.          Radiology Studies: Dg Chest 2 View  Result Date: 12/20/2017 CLINICAL DATA:  Nonresponsive, tachypnea EXAM: CHEST  2 VIEW COMPARISON:  04/03/2014 FINDINGS:  The heart size and mediastinal contours are within normal limits. Both lungs are clear. The visualized skeletal structures are unremarkable. IMPRESSION: No active cardiopulmonary disease. Electronically Signed   By: Jasmine Pang M.D.   On: 12/20/2017 03:26   Ct Head Wo Contrast  Result Date: 12/19/2017 CLINICAL DATA:  Lethargic EXAM: CT HEAD WITHOUT CONTRAST TECHNIQUE: Contiguous axial images were obtained from the base of the skull through the vertex without intravenous contrast. COMPARISON:  None. FINDINGS: Brain: No evidence of acute infarction, hemorrhage, hydrocephalus, extra-axial collection or mass lesion/mass effect. Vascular: No hyperdense vessel or unexpected calcification. Skull: Normal. Negative for fracture or focal lesion. Sinuses/Orbits: Mucosal thickening in the ethmoid sinuses. Mucous retention cysts in the maxillary sinuses. No acute orbital abnormality. Other: None IMPRESSION:  No CT evidence for acute intracranial abnormality. Negative non contrasted CT of the brain Electronically Signed   By: Jasmine PangKim  Fujinaga M.D.   On: 12/19/2017 21:12        Scheduled Meds: . cloNIDine  0.1 mg Oral BID  . [START ON 12/22/2017] cloNIDine  0.1 mg Oral Daily  . dicyclomine  10 mg Oral TID AC  . enoxaparin (LOVENOX) injection  40 mg Subcutaneous Q24H   Continuous Infusions:    LOS: 1 day    Time spent: 35min    Zannie CovePreetha Aeon Kessner, MD [facility] Page via [email or web address], password [email or web address] After 7PM please contact night-coverage  12/21/2017, 1:29 PM

## 2017-12-21 NOTE — Plan of Care (Signed)
Progressing

## 2017-12-22 DIAGNOSIS — E860 Dehydration: Secondary | ICD-10-CM

## 2017-12-22 DIAGNOSIS — R4182 Altered mental status, unspecified: Secondary | ICD-10-CM

## 2017-12-22 LAB — URINE CULTURE

## 2017-12-22 LAB — BASIC METABOLIC PANEL
Anion gap: 11 (ref 5–15)
BUN: 8 mg/dL (ref 6–20)
CALCIUM: 8.8 mg/dL — AB (ref 8.9–10.3)
CHLORIDE: 109 mmol/L (ref 101–111)
CO2: 20 mmol/L — ABNORMAL LOW (ref 22–32)
CREATININE: 0.66 mg/dL (ref 0.44–1.00)
GFR calc Af Amer: >60 mL/min (ref >60)
GFR calc non Af Amer: >60 mL/min (ref >60)
GLUCOSE: 105 mg/dL — AB (ref 65–99)
Potassium: 3.1 mmol/L — ABNORMAL LOW (ref 3.5–5.1)
Sodium: 140 mmol/L (ref 135–145)

## 2017-12-22 LAB — CBC
HCT: 36.5 % (ref 36.0–46.0)
Hemoglobin: 12 g/dL (ref 12.0–15.0)
MCH: 28.6 pg (ref 26.0–34.0)
MCHC: 32.9 g/dL (ref 30.0–36.0)
MCV: 86.9 fL (ref 78.0–100.0)
PLATELETS: 199 10*3/uL (ref 150–400)
RBC: 4.2 MIL/uL (ref 3.87–5.11)
RDW: 13.7 % (ref 11.5–15.5)
WBC: 7.8 10*3/uL (ref 4.0–10.5)

## 2017-12-22 LAB — FOLATE RBC
FOLATE, RBC: 1196 ng/mL (ref >498)
Folate, Hemolysate: 455.8 ng/mL
Hematocrit: 38.1 % (ref 34.0–46.6)

## 2017-12-22 LAB — GLUCOSE, CAPILLARY: GLUCOSE-CAPILLARY: 126 mg/dL — AB (ref 65–99)

## 2017-12-22 MED ORDER — DICYCLOMINE HCL 10 MG PO CAPS
10.0000 mg | ORAL_CAPSULE | Freq: Three times a day (TID) | ORAL | Status: DC | PRN
  Filled 2017-12-22: qty 1

## 2017-12-22 NOTE — Progress Notes (Signed)
PROGRESS NOTE    Diane Foley  WUJ:[id number] DOB: August 18, 1986 DOA: 12/19/2017 PCP: Patient, No Pcp Per  Brief Narrative: Ms. [name] reverses 32 year old female brought to the emergency room by [facility] officers for nausea and vomiting, she has a history of polysubstance abuse and heroin dependence/active IV drug abuse, last use being 1/12 at 8 AM. Patient unable to provide any history she is confused, slow to respond an extremely difficult historian. She was suspected to be withdrawing from opiates and given them till Robaxin and Toradol and Narcan in the emergency room  Assessment & Plan:   1. Acute metabolic encephalopathy -Likely related to heroin withdrawal vs UTI -mentation much improved -Blood cultures negative so far, urine Cx with 100K Ecoli-Continue IV ceftriaxone -continue clonidine scheduled for withdrawal  -improving well, advance diet  2. Nausea and vomiting -Likely related to withdrawal versus UTI -continue Day2 of IV ceftriaxone -advance diet to regular, FU urine Cx  -Blood cultures no growth to date  -Urine hCG less than 5  3. Prolonged QTC -Likely related to substance abuse/heroin -Repeat EKG -QTc much improved  4. Polysubstance abuse -History of heroin dependence and cocaine abuse, not on methadone any more -Per ED triage notes uses 2-2.5 g of heroin IV daily -HIV negative, hepatitis C Ab pending, needs quantiferon and genotype and FU -Social work consult been more awake   DVT prophylaxis: Lovenox  Code Status:  full code  Family Communication: no family, Public relations account executive at bedside  Disposition Plan: discharge later today vs 1/16  Consultants:   None    Procedures:   Antimicrobials:    Subjective: -feels better, very hungry wants food  Objective: Vitals:   12/21/17 0624 12/21/17 1424 12/21/17 2223 12/22/17 0509  BP: (!) 154/97 131/84 (!) 133/94 129/82  Pulse: 68 63 (!) 56 (!) 56  Resp:  20 18 18   Temp: 98.5 F (36.9  C) 98.3 F (36.8 C) 98.6 F (37 C) (!) 97.5 F (36.4 C)  TempSrc: Oral Oral Oral Oral  SpO2: 100% 99% 99% 100%  Weight:      Height:        Intake/Output Summary (Last 24 hours) at 12/22/2017 1336 Last data filed at 12/22/2017 0527 Gross per 24 hour  Intake 1140 ml  Output -  Net 1140 ml   Filed Weights   12/20/17 0426  Weight: 46.7 kg (102 lb 15.3 oz)    Examination:  Gen: Awake, Alert, Oriented X 3,  HEENT: PERRLA, Neck supple, no JVD Lungs: Good air movement bilaterally, CTAB CVS: RRR,No Gallops,Rubs or new Murmurs Abd: soft, Non tender, non distended, BS present Extremities: No Cyanosis, Clubbing or edema Skin: no new rashes Psych: improved and appropriate now  Data Reviewed:   CBC: Recent Labs  Lab 12/19/17 1918 12/19/17 1938 12/20/17 0215 12/20/17 0334 12/22/17 1058  WBC 12.0*  --  11.3* 12.1* 7.8  NEUTROABS  --   --  9.8* 10.4*  --   HGB 14.9 15.0 13.5 13.1 12.0  HCT 43.0 44.0 40.7 39.2 36.5  MCV 86.0  --  86.2 88.3 86.9  PLT 330  --  281 257 199   Basic Metabolic Panel: Recent Labs  Lab 12/19/17 1918 12/19/17 1938 12/20/17 0334 12/22/17 0556  NA 137 140 139 140  K 3.7 3.8 3.4* 3.1*  CL 102 103 108 109  CO2 21*  --  20* 20*  GLUCOSE 135* 138* 119* 105*  BUN 9 9 9 8   CREATININE 0.72 0.60 0.71 0.66  CALCIUM 9.6  --  8.8* 8.8*  MG  --   --  1.7  --    GFR: Estimated Creatinine Clearance: 75.8 mL/min (by C-G formula based on SCr of 0.66 mg/dL). Liver Function Tests: Recent Labs  Lab 12/19/17 1918  AST 25  ALT 21  ALKPHOS 60  BILITOT 0.9  PROT 8.0  ALBUMIN 4.1   Recent Labs  Lab 12/19/17 1918  LIPASE 23   Recent Labs  Lab 12/20/17 0334  AMMONIA 15   Coagulation Profile: No results for input(s): INR, PROTIME in the last 168 hours. Cardiac Enzymes: Recent Labs  Lab 12/20/17 0037  CKTOTAL 178   BNP (last 3 results) No results for input(s): PROBNP in the last 8760 hours. HbA1C: No results for input(s): HGBA1C in the  last 72 hours. CBG: Recent Labs  Lab 12/20/17 0748 12/21/17 1215 12/22/17 0803  GLUCAP 109* 105* 126*   Lipid Profile: No results for input(s): CHOL, HDL, LDLCALC, TRIG, CHOLHDL, LDLDIRECT in the last 72 hours. Thyroid Function Tests: Recent Labs    12/20/17 0333  TSH 0.388   Anemia Panel: Recent Labs    12/20/17 0334  VITAMINB12 519   Urine analysis:    Component Value Date/Time   COLORURINE AMBER (A) 12/20/2017 0130   APPEARANCEUR CLOUDY (A) 12/20/2017 0130   APPEARANCEUR Hazy 04/03/2014 2109   LABSPEC 1.024 12/20/2017 0130   LABSPEC 1.014 04/03/2014 2109   PHURINE 7.0 12/20/2017 0130   GLUCOSEU NEGATIVE 12/20/2017 0130   GLUCOSEU Negative 04/03/2014 2109   HGBUR NEGATIVE 12/20/2017 0130   BILIRUBINUR NEGATIVE 12/20/2017 0130   BILIRUBINUR Negative 04/03/2014 2109   KETONESUR 80 (A) 12/20/2017 0130   PROTEINUR 30 (A) 12/20/2017 0130   UROBILINOGEN 4.0 (H) 06/13/2013 1953   NITRITE NEGATIVE 12/20/2017 0130   LEUKOCYTESUR SMALL (A) 12/20/2017 0130   LEUKOCYTESUR Trace 04/03/2014 2109   Sepsis Labs: @LABRCNTIP (procalcitonin:4,lacticidven:4)  ) Recent Results (from the past 240 hour(s))  Urine culture     Status: Abnormal (Preliminary result)   Collection Time: 12/20/17  1:30 AM  Result Value Ref Range Status   Specimen Description URINE, RANDOM  Final   Special Requests NONE  Final   Culture >=100,000 COLONIES/mL ESCHERICHIA COLI (A)  Final   Report Status PENDING  Incomplete  MRSA PCR Screening     Status: None   Collection Time: 12/20/17  4:36 AM  Result Value Ref Range Status   MRSA by PCR NEGATIVE NEGATIVE Final    Comment:        The GeneXpert MRSA Assay (FDA approved for NASAL specimens only), is one component of a comprehensive MRSA colonization surveillance program. It is not intended to diagnose MRSA infection nor to guide or monitor treatment for MRSA infections.   Culture, blood (routine x 2)     Status: None (Preliminary result)    Collection Time: 12/20/17 10:41 AM  Result Value Ref Range Status   Specimen Description BLOOD LEFT HAND  Final   Special Requests IN PEDIATRIC BOTTLE Blood Culture adequate volume  Final   Culture NO GROWTH 2 DAYS  Final   Report Status PENDING  Incomplete  Culture, blood (routine x 2)     Status: None (Preliminary result)   Collection Time: 12/20/17 10:42 AM  Result Value Ref Range Status   Specimen Description BLOOD LEFT HAND  Final   Special Requests IN PEDIATRIC BOTTLE Blood Culture adequate volume  Final   Culture NO GROWTH 2 DAYS  Final   Report Status PENDING  Incomplete         Radiology Studies: No results found.      Scheduled Meds: . cloNIDine  0.1 mg Oral Daily  . enoxaparin (LOVENOX) injection  40 mg Subcutaneous Q24H   Continuous Infusions: . 0.9 % NaCl with KCl 20 mEq / L 75 mL/hr at 12/22/17 0219  . cefTRIAXone (ROCEPHIN)  IV Stopped (12/21/17 1429)     LOS: 2 days    Time spent:    Zannie Cove, MD [facility] Page via [email or web address], password [email or web address] After 7PM please contact night-coverage  12/22/2017, 1:36 PM

## 2017-12-23 DIAGNOSIS — G9340 Encephalopathy, unspecified: Secondary | ICD-10-CM

## 2017-12-23 DIAGNOSIS — R768 Other specified abnormal immunological findings in serum: Secondary | ICD-10-CM | POA: Diagnosis present

## 2017-12-23 LAB — GLUCOSE, CAPILLARY: GLUCOSE-CAPILLARY: 109 mg/dL — AB (ref 65–99)

## 2017-12-23 MED ORDER — ACETAMINOPHEN 325 MG PO TABS: 650.0000 mg | ORAL_TABLET | Freq: Four times a day (QID) | ORAL | Status: DC | PRN

## 2017-12-23 MED ORDER — CEPHALEXIN 250 MG PO CAPS: 250.0000 mg | ORAL_CAPSULE | Freq: Three times a day (TID) | ORAL | 0 refills | Status: DC

## 2017-12-23 MED ORDER — CLONIDINE HCL 0.1 MG PO TABS: 0.1000 mg | ORAL_TABLET | Freq: Every day | ORAL | 0 refills | Status: DC

## 2017-12-23 MED ORDER — CEPHALEXIN 250 MG PO CAPS
250.0000 mg | ORAL_CAPSULE | Freq: Three times a day (TID) | ORAL | Status: DC
  Administered 2017-12-23 (×2): 250 mg via ORAL
  Filled 2017-12-23 (×2): qty 1

## 2017-12-23 NOTE — Progress Notes (Signed)
[facility]

## 2017-12-23 NOTE — Progress Notes (Signed)
Patient discharge teaching given, including activity, diet, follow-up appoints, and medications. Patient verbalized understanding of all discharge instructions. IV access was d/c'd. Vitals are stable. Skin is intact except as charted in most recent assessments. Pt to be escorted out by NT/guard, to be [facility] back [facility] jail.

## 2017-12-23 NOTE — Discharge Summary (Signed)
Physician Discharge Summary  [place] VWU:[id number] DOB: [birth year] DOA: 12/19/2017  PCP: Patient, No Pcp Per  Admit date: 12/19/2017 Discharge date: 12/23/2017  Time spent: 45 minutes  Recommendations for Outpatient Follow-up:  1. MD at Prison/Jail    Discharge Diagnoses:  Principal Problem:   Acute encephalopathy   Ecoli UTI   Opioid type dependence, continuous (HCC)   Polysubstance abuse (HCC)   Prolonged QT interval   Hepatitis C antibody test positive   Discharge Condition: stable  Diet recommendation: regular  Filed Weights   12/20/17 0426  Weight: 46.7 kg (102 lb 15.3 oz)    History of present illness:  Diane Foley reverses 32 year old female brought to the emergency room by [facility] officers for nausea and vomiting, she has a history of polysubstance abuse and heroin dependence/active IV drug abuse  Hospital Course:   1. Acute metabolic encephalopathy -Likely related to heroin withdrawal vs UTI -mentation improved back to normal baseline -Blood cultures negative x3days, urine Cx grew 100K Ecoli-was on IV ceftriaxone, discharged on Keflex -continue clonidine scheduled for withdrawal for days -improving well, advanced diet  2. Nausea and vomiting -Likely related to withdrawal versus UTI -resolved, completed 2days of IV ceftriaxone transitioned to oral kelfex at discharge  -advance diet to regular tolerating this  -Blood cultures no growth to date  -Urine hCG less than 5  3. Prolonged QTC -Likely related to substance abuse/heroin -Repeat EKG -QTc much improved  4. Polysubstance abuse -History of heroin dependence and cocaine abuse, not on methadone any more -counseled -Per ED triage notes uses 2-2.5 g of heroin IV daily -HIV negative -she is aware of all local resources, currently going back to prison  5. Ecoli UTI -Abx as listed above    Discharge Exam: Vitals:   12/22/17 2140 12/23/17 0649  BP: 133/87 130/85   Pulse: (!) 52 (!) 51  Resp: 18 17  Temp: 98.1 F (36.7 C) 98.1 F (36.7 C)  SpO2: 99% 99%    General: AAOx3 Cardiovascular: S1s2/RRR Respiratory: CTAB  Discharge Instructions    Allergies as of 12/23/2017   No Known Allergies     Medication List    TAKE these medications   acetaminophen 325 MG tablet Commonly known as:  TYLENOL Take 2 tablets (650 mg total) by mouth every 6 (six) hours as needed for mild pain or fever (or Fever >/= 101).   cephALEXin 250 MG capsule Commonly known as:  KEFLEX Take 1 capsule (250 mg total) by mouth every 8 (eight) hours. For 3days   cloNIDine 0.1 MG tablet Commonly known as:  CATAPRES Take 1 tablet (0.1 mg total) by mouth daily. For 3days What changed:    when to take this  additional instructions   dicyclomine 20 MG tablet Commonly known as:  BENTYL Take 1 tablet (20 mg total) by mouth 4 (four) times daily -  before meals and at bedtime.      No Known Allergies Follow-up Information    PCP. Schedule an appointment as soon as possible for a visit in 1 week(s).            The results of significant diagnostics from this hospitalization (including imaging, microbiology, ancillary and laboratory) are listed below for reference.    Significant Diagnostic Studies: Dg Chest 2 View  Result Date: 12/20/2017 CLINICAL DATA:  Nonresponsive, tachypnea EXAM: CHEST  2 VIEW COMPARISON:  04/03/2014 FINDINGS: The heart size and mediastinal contours are within normal limits. Both lungs are clear. The visualized  skeletal structures are unremarkable. IMPRESSION: No active cardiopulmonary disease. Electronically Signed   By: Jasmine PangKim  Fujinaga M.D.   On: 12/20/2017 03:26   Ct Head Wo Contrast  Result Date: 12/19/2017 CLINICAL DATA:  Lethargic EXAM: CT HEAD WITHOUT CONTRAST TECHNIQUE: Contiguous axial images were obtained from the base of the skull through the vertex without intravenous contrast. COMPARISON:  None. FINDINGS: Brain: No evidence of  acute infarction, hemorrhage, hydrocephalus, extra-axial collection or mass lesion/mass effect. Vascular: No hyperdense vessel or unexpected calcification. Skull: Normal. Negative for fracture or focal lesion. Sinuses/Orbits: Mucosal thickening in the ethmoid sinuses. Mucous retention cysts in the maxillary sinuses. No acute orbital abnormality. Other: None IMPRESSION: No CT evidence for acute intracranial abnormality. Negative non contrasted CT of the brain Electronically Signed   By: Jasmine PangKim  Fujinaga M.D.   On: 12/19/2017 21:12    Microbiology: Recent Results (from the past 240 hour(s))  Urine culture     Status: Abnormal   Collection Time: 12/20/17  1:30 AM  Result Value Ref Range Status   Specimen Description URINE, RANDOM  Final   Special Requests NONE  Final   Culture >=100,000 COLONIES/mL ESCHERICHIA COLI (A)  Final   Report Status 12/22/2017 FINAL  Final   Organism ID, Bacteria ESCHERICHIA COLI (A)  Final      Susceptibility   Escherichia coli - MIC*    AMPICILLIN <=2 SENSITIVE Sensitive     CEFAZOLIN <=4 SENSITIVE Sensitive     CEFTRIAXONE <=1 SENSITIVE Sensitive     CIPROFLOXACIN >=4 RESISTANT Resistant     GENTAMICIN <=1 SENSITIVE Sensitive     IMIPENEM <=0.25 SENSITIVE Sensitive     NITROFURANTOIN <=16 SENSITIVE Sensitive     TRIMETH/SULFA <=20 SENSITIVE Sensitive     AMPICILLIN/SULBACTAM <=2 SENSITIVE Sensitive     PIP/TAZO <=4 SENSITIVE Sensitive     Extended ESBL NEGATIVE Sensitive     * >=100,000 COLONIES/mL ESCHERICHIA COLI  MRSA PCR Screening     Status: None   Collection Time: 12/20/17  4:36 AM  Result Value Ref Range Status   MRSA by PCR NEGATIVE NEGATIVE Final    Comment:        The GeneXpert MRSA Assay (FDA approved for NASAL specimens only), is one component of a comprehensive MRSA colonization surveillance program. It is not intended to diagnose MRSA infection nor to guide or monitor treatment for MRSA infections.   Culture, blood (routine x 2)      Status: None (Preliminary result)   Collection Time: 12/20/17 10:41 AM  Result Value Ref Range Status   Specimen Description BLOOD LEFT HAND  Final   Special Requests IN PEDIATRIC BOTTLE Blood Culture adequate volume  Final   Culture NO GROWTH 2 DAYS  Final   Report Status PENDING  Incomplete  Culture, blood (routine x 2)     Status: None (Preliminary result)   Collection Time: 12/20/17 10:42 AM  Result Value Ref Range Status   Specimen Description BLOOD LEFT HAND  Final   Special Requests IN PEDIATRIC BOTTLE Blood Culture adequate volume  Final   Culture NO GROWTH 2 DAYS  Final   Report Status PENDING  Incomplete     Labs: Basic Metabolic Panel: Recent Labs  Lab 12/19/17 1918 12/19/17 1938 12/20/17 0334 12/22/17 0556  NA 137 140 139 140  K 3.7 3.8 3.4* 3.1*  CL 102 103 108 109  CO2 21*  --  20* 20*  GLUCOSE 135* 138* 119* 105*  BUN 9 9 9 8   CREATININE  0.72 0.60 0.71 0.66  CALCIUM 9.6  --  8.8* 8.8*  MG  --   --  1.7  --    Liver Function Tests: Recent Labs  Lab 12/19/17 1918  AST 25  ALT 21  ALKPHOS 60  BILITOT 0.9  PROT 8.0  ALBUMIN 4.1   Recent Labs  Lab 12/19/17 1918  LIPASE 23   Recent Labs  Lab 12/20/17 0334  AMMONIA 15   CBC: Recent Labs  Lab 12/19/17 1918 12/19/17 1938 12/20/17 0215 12/20/17 0334 12/22/17 1058  WBC 12.0*  --  11.3* 12.1* 7.8  NEUTROABS  --   --  9.8* 10.4*  --   HGB 14.9 15.0 13.5 13.1 12.0  HCT 43.0 44.0 40.7 39.2  38.1 36.5  MCV 86.0  --  86.2 88.3 86.9  PLT 330  --  281 257 199   Cardiac Enzymes: Recent Labs  Lab 12/20/17 0037  CKTOTAL 178   BNP: BNP (last 3 results) No results for input(s): BNP in the last 8760 hours.  ProBNP (last 3 results) No results for input(s): PROBNP in the last 8760 hours.  CBG: Recent Labs  Lab 12/20/17 0748 12/21/17 1215 12/22/17 0803 12/23/17 0800  GLUCAP 109* 105* 126* 109*       Signed:  Zannie Cove MD.  [facility] 12/23/2017, 11:57 AM

## 2017-12-25 LAB — CULTURE, BLOOD (ROUTINE X 2)
CULTURE: NO GROWTH
Culture: NO GROWTH
SPECIAL REQUESTS: ADEQUATE
Special Requests: ADEQUATE

## 2018-05-20 DIAGNOSIS — F1410 Cocaine abuse, uncomplicated: Secondary | ICD-10-CM | POA: Diagnosis present

## 2018-11-20 ENCOUNTER — Other Ambulatory Visit: Payer: Self-pay

## 2018-11-20 ENCOUNTER — Inpatient Hospital Stay (HOSPITAL_COMMUNITY)
Admission: EM | Admit: 2018-11-20 | Discharge: 2018-11-22 | DRG: 831 | Disposition: A | Discharge status: Home / Self Care | Attending: Internal Medicine | Admitting: Internal Medicine

## 2018-11-20 ENCOUNTER — Inpatient Hospital Stay (HOSPITAL_COMMUNITY)

## 2018-11-20 ENCOUNTER — Emergency Department (HOSPITAL_COMMUNITY)

## 2018-11-20 ENCOUNTER — Encounter (HOSPITAL_COMMUNITY): Payer: Self-pay

## 2018-11-20 DIAGNOSIS — G9340 Encephalopathy, unspecified: Secondary | ICD-10-CM

## 2018-11-20 DIAGNOSIS — F1910 Other psychoactive substance abuse, uncomplicated: Secondary | ICD-10-CM | POA: Diagnosis present

## 2018-11-20 DIAGNOSIS — O99322 Drug use complicating pregnancy, second trimester: Principal | ICD-10-CM | POA: Diagnosis present

## 2018-11-20 DIAGNOSIS — E876 Hypokalemia: Secondary | ICD-10-CM | POA: Diagnosis not present

## 2018-11-20 DIAGNOSIS — G9341 Metabolic encephalopathy: Secondary | ICD-10-CM | POA: Diagnosis present

## 2018-11-20 DIAGNOSIS — F39 Unspecified mood [affective] disorder: Secondary | ICD-10-CM | POA: Diagnosis present

## 2018-11-20 DIAGNOSIS — O99342 Other mental disorders complicating pregnancy, second trimester: Secondary | ICD-10-CM | POA: Diagnosis present

## 2018-11-20 DIAGNOSIS — T40604A Poisoning by unspecified narcotics, undetermined, initial encounter: Secondary | ICD-10-CM

## 2018-11-20 DIAGNOSIS — Z781 Physical restraint status: Secondary | ICD-10-CM | POA: Diagnosis not present

## 2018-11-20 DIAGNOSIS — O99352 Diseases of the nervous system complicating pregnancy, second trimester: Secondary | ICD-10-CM | POA: Diagnosis present

## 2018-11-20 DIAGNOSIS — F1123 Opioid dependence with withdrawal: Secondary | ICD-10-CM | POA: Diagnosis present

## 2018-11-20 DIAGNOSIS — F329 Major depressive disorder, single episode, unspecified: Secondary | ICD-10-CM | POA: Diagnosis present

## 2018-11-20 DIAGNOSIS — K920 Hematemesis: Secondary | ICD-10-CM | POA: Diagnosis present

## 2018-11-20 DIAGNOSIS — F1193 Opioid use, unspecified with withdrawal: Secondary | ICD-10-CM | POA: Diagnosis present

## 2018-11-20 DIAGNOSIS — R112 Nausea with vomiting, unspecified: Secondary | ICD-10-CM

## 2018-11-20 DIAGNOSIS — E873 Alkalosis: Secondary | ICD-10-CM | POA: Diagnosis present

## 2018-11-20 DIAGNOSIS — F17210 Nicotine dependence, cigarettes, uncomplicated: Secondary | ICD-10-CM | POA: Diagnosis present

## 2018-11-20 DIAGNOSIS — O99012 Anemia complicating pregnancy, second trimester: Secondary | ICD-10-CM | POA: Diagnosis present

## 2018-11-20 DIAGNOSIS — K922 Gastrointestinal hemorrhage, unspecified: Secondary | ICD-10-CM | POA: Diagnosis not present

## 2018-11-20 DIAGNOSIS — O99332 Smoking (tobacco) complicating pregnancy, second trimester: Secondary | ICD-10-CM | POA: Diagnosis present

## 2018-11-20 DIAGNOSIS — Z3A24 24 weeks gestation of pregnancy: Secondary | ICD-10-CM

## 2018-11-20 DIAGNOSIS — F1120 Opioid dependence, uncomplicated: Secondary | ICD-10-CM | POA: Diagnosis present

## 2018-11-20 DIAGNOSIS — O99320 Drug use complicating pregnancy, unspecified trimester: Secondary | ICD-10-CM

## 2018-11-20 DIAGNOSIS — D649 Anemia, unspecified: Secondary | ICD-10-CM | POA: Diagnosis present

## 2018-11-20 DIAGNOSIS — R7689 Other specified abnormal immunological findings in serum: Secondary | ICD-10-CM | POA: Diagnosis present

## 2018-11-20 DIAGNOSIS — R768 Other specified abnormal immunological findings in serum: Secondary | ICD-10-CM | POA: Diagnosis present

## 2018-11-20 DIAGNOSIS — Z3A21 21 weeks gestation of pregnancy: Secondary | ICD-10-CM

## 2018-11-20 DIAGNOSIS — F322 Major depressive disorder, single episode, severe without psychotic features: Secondary | ICD-10-CM | POA: Diagnosis present

## 2018-11-20 HISTORY — DX: 24 weeks gestation of pregnancy: Z3A.24

## 2018-11-20 LAB — RAPID URINE DRUG SCREEN, HOSP PERFORMED
Amphetamines: NOT DETECTED
BARBITURATES: NOT DETECTED
Benzodiazepines: NOT DETECTED
Cocaine: POSITIVE — AB
Opiates: POSITIVE — AB
TETRAHYDROCANNABINOL: NOT DETECTED

## 2018-11-20 LAB — CBC WITH DIFFERENTIAL/PLATELET
Abs Immature Granulocytes: 0.12 10*3/uL — ABNORMAL HIGH (ref 0.00–0.07)
Basophils Absolute: 0 10*3/uL (ref 0.0–0.1)
Basophils Relative: 0 %
Eosinophils Absolute: 0 10*3/uL (ref 0.0–0.5)
Eosinophils Relative: 0 %
HCT: 48.7 % — ABNORMAL HIGH (ref 36.0–46.0)
Hemoglobin: 16.4 g/dL — ABNORMAL HIGH (ref 12.0–15.0)
Immature Granulocytes: 1 %
Lymphocytes Relative: 7 %
Lymphs Abs: 1 10*3/uL (ref 0.7–4.0)
MCH: 30.6 pg (ref 26.0–34.0)
MCHC: 33.7 g/dL (ref 30.0–36.0)
MCV: 90.9 fL (ref 80.0–100.0)
Monocytes Absolute: 0.2 10*3/uL (ref 0.1–1.0)
Monocytes Relative: 1 %
Neutro Abs: 13.6 10*3/uL — ABNORMAL HIGH (ref 1.7–7.7)
Neutrophils Relative %: 91 %
Platelets: 430 10*3/uL — ABNORMAL HIGH (ref 150–400)
RBC: 5.36 MIL/uL — ABNORMAL HIGH (ref 3.87–5.11)
RDW: 14 % (ref 11.5–15.5)
WBC: 15 10*3/uL — ABNORMAL HIGH (ref 4.0–10.5)
nRBC: 0 % (ref 0.0–0.2)

## 2018-11-20 LAB — COMPREHENSIVE METABOLIC PANEL
ALT: 17 U/L (ref 0–44)
AST: 21 U/L (ref 15–41)
Albumin: 4.6 g/dL (ref 3.5–5.0)
Alkaline Phosphatase: 60 U/L (ref 38–126)
Anion gap: 17 — ABNORMAL HIGH (ref 5–15)
BUN: 6 mg/dL (ref 6–20)
CALCIUM: 9.9 mg/dL (ref 8.9–10.3)
CO2: 20 mmol/L — ABNORMAL LOW (ref 22–32)
Chloride: 99 mmol/L (ref 98–111)
Creatinine, Ser: 0.69 mg/dL (ref 0.44–1.00)
Glucose, Bld: 135 mg/dL — ABNORMAL HIGH (ref 70–99)
Potassium: 2.9 mmol/L — ABNORMAL LOW (ref 3.5–5.1)
Sodium: 136 mmol/L (ref 135–145)
Total Bilirubin: 0.6 mg/dL (ref 0.3–1.2)
Total Protein: 9.6 g/dL — ABNORMAL HIGH (ref 6.5–8.1)

## 2018-11-20 LAB — BLOOD GAS, VENOUS
Acid-base deficit: 2.2 mmol/L — ABNORMAL HIGH (ref 0.0–2.0)
Bicarbonate: 19.4 mmol/L — ABNORMAL LOW (ref 20.0–28.0)
O2 Saturation: 85.4 %
PATIENT TEMPERATURE: 98.6
pCO2, Ven: 25.1 mmHg — ABNORMAL LOW (ref 44.0–60.0)
pH, Ven: 7.5 — ABNORMAL HIGH (ref 7.250–7.430)
pO2, Ven: 48.3 mmHg — ABNORMAL HIGH (ref 32.0–45.0)

## 2018-11-20 LAB — ACETAMINOPHEN LEVEL: Acetaminophen (Tylenol), Serum: <10 ug/mL — ABNORMAL LOW (ref 10–30)

## 2018-11-20 LAB — HCG, QUANTITATIVE, PREGNANCY: hCG, Beta Chain, Quant, S: 66336 m[IU]/mL — ABNORMAL HIGH (ref <5)

## 2018-11-20 LAB — ETHANOL: Alcohol, Ethyl (B): <10 mg/dL (ref <10)

## 2018-11-20 LAB — I-STAT CG4 LACTIC ACID, ED: Lactic Acid, Venous: 1.53 mmol/L (ref 0.5–1.9)

## 2018-11-20 LAB — SALICYLATE LEVEL: Salicylate Lvl: <7 mg/dL (ref 2.8–30.0)

## 2018-11-20 MED ORDER — PANTOPRAZOLE SODIUM 40 MG IV SOLR
40.0000 mg | Freq: Two times a day (BID) | INTRAVENOUS | Status: DC
  Administered 2018-11-20 – 2018-11-21 (×3): 40 mg via INTRAVENOUS
  Filled 2018-11-20 (×3): qty 40

## 2018-11-20 MED ORDER — ONDANSETRON HCL 4 MG/2ML IJ SOLN
4.0000 mg | Freq: Once | INTRAMUSCULAR | Status: AC
  Administered 2018-11-20: 4 mg via INTRAVENOUS
  Filled 2018-11-20: qty 2

## 2018-11-20 MED ORDER — SODIUM CHLORIDE 0.9 % IV BOLUS
2000.0000 mL | Freq: Once | INTRAVENOUS | Status: AC
  Administered 2018-11-20: 2000 mL via INTRAVENOUS

## 2018-11-20 MED ORDER — SODIUM CHLORIDE 0.9 % IV SOLN
INTRAVENOUS | Status: DC
  Filled 2018-11-20: qty 1000

## 2018-11-20 MED ORDER — NALOXONE HCL 4 MG/0.1ML NA LIQD
0.4000 mg | Freq: Once | NASAL | Status: AC | PRN
  Administered 2018-11-20: 0.4 mg via NASAL

## 2018-11-20 MED ORDER — POTASSIUM CHLORIDE 10 MEQ/100ML IV SOLN
10.0000 meq | INTRAVENOUS | Status: AC
  Administered 2018-11-20 – 2018-11-21 (×5): 10 meq via INTRAVENOUS
  Filled 2018-11-20 (×4): qty 100

## 2018-11-20 MED ORDER — ACETAMINOPHEN 650 MG RE SUPP: 650.0000 mg | Freq: Four times a day (QID) | RECTAL | Status: DC | PRN

## 2018-11-20 MED ORDER — NALOXONE HCL 4 MG/0.1ML NA LIQD: 0.4000 mg | Freq: Once | NASAL | Status: DC | PRN

## 2018-11-20 MED ORDER — NALOXONE HCL 4 MG/10ML IJ SOLN: 0.5000 mg/h | INTRAVENOUS | Status: DC

## 2018-11-20 MED ORDER — POTASSIUM CHLORIDE IN NACL 40-0.9 MEQ/L-% IV SOLN
INTRAVENOUS | Status: DC
  Administered 2018-11-20 – 2018-11-22 (×3): 100 mL/h via INTRAVENOUS
  Filled 2018-11-20 (×4): qty 1000

## 2018-11-20 MED ORDER — ACETAMINOPHEN 325 MG PO TABS: 650.0000 mg | ORAL_TABLET | Freq: Four times a day (QID) | ORAL | Status: DC | PRN

## 2018-11-20 MED ORDER — SODIUM CHLORIDE 0.9 % IV BOLUS
1000.0000 mL | Freq: Once | INTRAVENOUS | Status: AC
  Administered 2018-11-20: 1000 mL via INTRAVENOUS

## 2018-11-20 MED ORDER — NALOXONE HCL 4 MG/0.1ML NA LIQD: 1.0000 | Freq: Once | NASAL | Status: DC

## 2018-11-20 MED ORDER — NALOXONE HCL 0.4 MG/ML IJ SOLN
INTRAMUSCULAR | Status: AC
  Administered 2018-11-20: 0.4 mg
  Filled 2018-11-20: qty 1

## 2018-11-20 MED ORDER — PANTOPRAZOLE SODIUM 40 MG IV SOLR
40.0000 mg | Freq: Once | INTRAVENOUS | Status: AC
  Administered 2018-11-20: 40 mg via INTRAVENOUS
  Filled 2018-11-20: qty 40

## 2018-11-20 MED ORDER — BUPRENORPHINE HCL-NALOXONE HCL 2-0.5 MG SL SUBL
1.0000 | SUBLINGUAL_TABLET | Freq: Once | SUBLINGUAL | Status: AC
  Administered 2018-11-20: 1 via SUBLINGUAL
  Filled 2018-11-20: qty 1

## 2018-11-20 MED ORDER — NALOXONE HCL 4 MG/0.1ML NA LIQD
1.0000 | Freq: Once | NASAL | Status: DC
  Filled 2018-11-20: qty 4

## 2018-11-20 MED ORDER — MAGNESIUM SULFATE 2 GM/50ML IV SOLN
2.0000 g | Freq: Once | INTRAVENOUS | Status: AC
  Administered 2018-11-20: 2 g via INTRAVENOUS
  Filled 2018-11-20: qty 50

## 2018-11-20 MED ORDER — BUPRENORPHINE HCL-NALOXONE HCL 2-0.5 MG SL SUBL
2.0000 | SUBLINGUAL_TABLET | Freq: Once | SUBLINGUAL | Status: AC
  Administered 2018-11-20: 2 via SUBLINGUAL
  Filled 2018-11-20: qty 2

## 2018-11-20 NOTE — ED Triage Notes (Signed)
[facility] 911 due to patient having N/V and some diarrhea. Jail confirms she is heroin user and is known in area for Heroin use. Patient is AOx4 and ambulatory with 1 person assist due to being dehydrated and weak.

## 2018-11-20 NOTE — ED Provider Notes (Addendum)
[facility]-EMERGENCY DEPT Provider Note   CSN: [id number] Arrival date & time: 11/20/18  0818     History   Chief Complaint Chief Complaint  Patient presents with  . Heroin Withdrawl    HPI Barrett ShellHolly Foley is a 32 y.o. female.  The history is provided by the police. No language interpreter was used.     32 year old female with known history of polysubstance abuse, bipolar disorder, PTSD, depression presenting with concerns of heroin withdrawal.  History is limited as patient does not give any history and appears to be drowsy.  [employer] is at bedside.  According to [employer], patient was placed in the jail house yesterday due to parole violation.  This morning, she is drowsy, and is incontinence of stool.  They suspect patient may be withdrawing from heroin use as she has known history of heroin use.  Patient is also currently pregnant.  Level 5 caveats due to altered mental status.  Past Medical History:  Diagnosis Date  . Anxiety   . Bipolar 1 disorder (HCC)    bipolar  . Cocaine abuse (HCC)   . Depression   . Drug abuse (HCC)   . Opioid abuse (HCC)   . PTSD (post-traumatic stress disorder)   . Sepsis (HCC)   . Sepsis(995.91)   . Suicidal ideation   . Suicidal ideations   . UTI (urinary tract infection)     Patient Active Problem List   Diagnosis Date Noted  . Hepatitis C antibody test positive 12/23/2017  . Polysubstance abuse (HCC) 12/20/2017  . Prolonged QT interval 12/20/2017  . Acute encephalopathy 12/20/2017  . Cocaine overdose, accidental or unintentional, initial encounter (HCC) 07/31/2017  . Hypokalemia 07/31/2017  . Normochromic normocytic anemia 07/31/2017  . Opioid type dependence, continuous (HCC) 02/16/2015  . MDD (major depressive disorder), severe (HCC) 02/11/2015  . Major depressive disorder without psychotic features 02/09/2015  . Mood disorder (HCC) 02/09/2015  . UTI (urinary tract infection)     Past Surgical History:  Procedure  Laterality Date  . APPENDECTOMY       OB History   No obstetric history on file.      Home Medications    Prior to Admission medications   Medication Sig Start Date End Date Taking? Authorizing Provider  acetaminophen (TYLENOL) 325 MG tablet Take 2 tablets (650 mg total) by mouth every 6 (six) hours as needed for mild pain or fever (or Fever >/= 101). 12/23/17   Zannie CoveJoseph, Preetha, MD  cephALEXin (KEFLEX) 250 MG capsule Take 1 capsule (250 mg total) by mouth every 8 (eight) hours. For 3days 12/23/17   Zannie CoveJoseph, Preetha, MD  cloNIDine (CATAPRES) 0.1 MG tablet Take 1 tablet (0.1 mg total) by mouth daily. For 3days 12/23/17   Zannie CoveJoseph, Preetha, MD  dicyclomine (BENTYL) 20 MG tablet Take 1 tablet (20 mg total) by mouth 4 (four) times daily -  before meals and at bedtime. 08/02/17 08/07/17  Shaune PollackIsaacs, Cameron, MD    Family History Family History  Problem Relation Age of Onset  . Cancer Mother     Social History Social History   Tobacco Use  . Smoking status: Current Every Day Smoker    Packs/day: 0.25    Types: Cigarettes  . Smokeless tobacco: Never Used  Substance Use Topics  . Alcohol use: Yes    Alcohol/week: 1.0 standard drinks    Types: 1 Cans of beer per week    Frequency: Never    Comment: occasionally  . Drug use: Yes  Types: Heroin, IV    Comment: heroin last used 12-18-17     Allergies   Patient has no known allergies.   Review of Systems Review of Systems  Unable to perform ROS: Mental status change     Physical Exam Updated Vital Signs BP 140/87 (BP Location: Right Arm)   Pulse 88   Temp 98.9 F (37.2 C) (Oral)   Resp (!) 28   Ht 5\' 3"  (1.6 m)   Wt 45 kg   SpO2 100%   BMI 17.57 kg/m   Physical Exam Vitals signs and nursing note reviewed.  Constitutional:      General: She is not in acute distress.    Appearance: She is well-developed.     Comments: Patient is drowsy but arousable.  Does not answer question.  HENT:     Head: Atraumatic.      Comments: head is normocephalic, atraumatic, no tenderness to face.  Mouth is dry. Eyes:     Conjunctiva/sclera: Conjunctivae normal.     Comments: Pupils equal round and reactive.  Neck:     Musculoskeletal: Neck supple. No neck rigidity.  Cardiovascular:     Comments: Mild tachycardia without murmur rubs or gallops Pulmonary:     Effort: Pulmonary effort is normal.     Breath sounds: Normal breath sounds. No rhonchi or rales.  Abdominal:     Palpations: Abdomen is soft.     Tenderness: There is no abdominal tenderness.  Genitourinary:    Comments: Chaperone present during exam.  Patient is incontinence of stool Skin:    Findings: No rash.     Comments: Track marks noted to upper extremities no signs of infection.  Neurological:     Comments: Able to follow some commands, able to move all 4 extremities.      ED Treatments / Results  Labs (all labs ordered are listed, but only abnormal results are displayed) Labs Reviewed  COMPREHENSIVE METABOLIC PANEL - Abnormal; Notable for the following components:      Result Value   Potassium 2.9 (*)    CO2 20 (*)    Glucose, Bld 135 (*)    Total Protein 9.6 (*)    Anion gap 17 (*)    All other components within normal limits  CBC WITH DIFFERENTIAL/PLATELET - Abnormal; Notable for the following components:   WBC 15.0 (*)    RBC 5.36 (*)    Hemoglobin 16.4 (*)    HCT 48.7 (*)    Platelets 430 (*)    Neutro Abs 13.6 (*)    Abs Immature Granulocytes 0.12 (*)    All other components within normal limits  HCG, QUANTITATIVE, PREGNANCY - Abnormal; Notable for the following components:   hCG, Beta Chain, Quant, [place] 40,981 (*)    All other components within normal limits  RAPID URINE DRUG SCREEN, HOSP PERFORMED - Abnormal; Notable for the following components:   Opiates POSITIVE (*)    Cocaine POSITIVE (*)    All other components within normal limits  ACETAMINOPHEN LEVEL - Abnormal; Notable for the following components:   Acetaminophen  (Tylenol), Serum <10 (*)    All other components within normal limits  BLOOD GAS, VENOUS - Abnormal; Notable for the following components:   pH, Ven 7.500 (*)    pCO2, Ven 25.1 (*)    pO2, Ven 48.3 (*)    Bicarbonate 19.4 (*)    Acid-base deficit 2.2 (*)    All other components within normal limits  CULTURE, BLOOD (  ROUTINE X 2)  CULTURE, BLOOD (ROUTINE X 2)  ETHANOL  SALICYLATE LEVEL  I-STAT CG4 LACTIC ACID, ED  I-STAT CG4 LACTIC ACID, ED  I-STAT CG4 LACTIC ACID, ED    EKG EKG Interpretation  Date/Time:  Saturday November 20 2018 08:46:01 EST Ventricular Rate:  76 PR Interval:    QRS Duration: 76 QT Interval:  379 QTC Calculation: 427 R Axis:   81 Text Interpretation:  Normal sinus rhythm T wave inversion Inferior injury pattern suggests right ventricular involvement, recommend adding leads V3r and V4r to confirm Non-specific ST-t changes diffusely Confirmed by Margarita Grizzle [phone]) on 11/20/2018 12:04:56 PM       Radiology Dg Chest Portable 1 View  Result Date: 11/20/2018 CLINICAL DATA:  Nausea, vomiting common diarrhea. EXAM: PORTABLE CHEST 1 VIEW COMPARISON:  None. FINDINGS: The heart size and mediastinal contours are within normal limits. Both lungs are clear. The visualized skeletal structures are unremarkable. IMPRESSION: No active disease. Electronically Signed   By: Gerome Sam III M.D   On: 11/20/2018 13:24    Procedures .Critical Care Performed by: Fayrene Helper, PA-C Authorized by: Fayrene Helper, PA-C   Critical care provider statement:    Critical care time (minutes):  65   Critical care was time spent personally by me on the following activities:  Discussions with consultants, evaluation of patient's response to treatment, examination of patient, ordering and performing treatments and interventions, ordering and review of laboratory studies, ordering and review of radiographic studies, pulse oximetry, re-evaluation of patient's condition, obtaining history  from patient or surrogate and review of old charts   (including critical care time)  Medications Ordered in ED Medications  sodium chloride 0.9 % bolus 1,000 mL (0 mLs Intravenous Stopped 11/20/18 1019)  naloxone (NARCAN) nasal spray 4 mg/0.1 mL (0.4 mg Nasal Provided for home use 11/20/18 0930)  naloxone (NARCAN) nasal spray 4 mg/0.1 mL (0.4 mg Nasal Provided for home use 11/20/18 1111)  naloxone (NARCAN) 0.4 MG/ML injection (0.4 mg  Given 11/20/18 1242)  ondansetron (ZOFRAN) injection 4 mg (4 mg Intravenous Given 11/20/18 1342)  sodium chloride 0.9 % bolus 2,000 mL (0 mLs Intravenous Stopped 11/20/18 1458)  pantoprazole (PROTONIX) injection 40 mg (40 mg Intravenous Given 11/20/18 1355)     Initial Impression / Assessment and Plan / ED Course  I have reviewed the triage vital signs and the nursing notes.  Pertinent labs & imaging results that were available during my care of the patient were reviewed by me and considered in my medical decision making (see chart for details).     BP (!) 142/82   Pulse 76 Comment: Simultaneous filing. User may not have seen previous data.  Temp 99.6 F (37.6 C) (Rectal)   Resp (!) 34 Comment: Simultaneous filing. User may not have seen previous data.  Ht 5\' 3"  (1.6 m)   Wt 45 kg   SpO2 100% Comment: Simultaneous filing. User may not have seen previous data.  BMI 17.57 kg/m    Final Clinical Impressions(s) / ED Diagnoses   Final diagnoses:  Opiate overdose, undetermined intent, initial encounter (HCC)  [redacted] weeks gestation of pregnancy  Acute encephalopathy  Acute upper GI bleed    ED Discharge Orders    None     9:06 AM Patient brought here from the jail house for concern of heroin withdrawal after she was incarcerated yesterday.  She was found to have altered mental status, vomiting, and bowel incontinence.  She does have track marks on her upper  extremities.  She is drowsy but does response to painful stimuli and does follow some  instruction.  She was reported to her to be [redacted] weeks pregnant.  I perform bedside ultrasound and did confirm evidence of IUP with normal fetal heart tone.  However, I will reach out to [facility] noticed for further assessment.  Work-up initiated.  10:25 AM [facility] nurse has seen pt.  Pt does have FHT at 154.  I have consulted oncall OBGYN Dr. Shawnie Pons who felt due to pt withdrawing from heroine pt should be transfer to [facility] for admission and to initiate subutex.  I discussed care with DR. Ray.    10:51 AM Pt is acutely encephalopathic likely 2/2 polysubstance use.  Labs remarkable for hypokalemia with K+ 2.9, will give supplementation.  WBC of 15, likely 2/2 stress.  Hgb 16.4, reflecting dehydration state, UDS positive for cocaine and opiates.  Pt is drowsy but does response to question with nods and head shakes.  She denies having any active pain.  Pt was given an initial dose of narcan 4mg  with some increase responsiveness.  Will order narcan PRN.  Pt to be transfer to [facility] for further care.  Tylenol and Salicylate level is pending at this time.   11:57 AM Venous blood gas without evidence of hypoxia.  Beta hCG is 66,000.  Tylenol and salicylate level are reassuring. EKG not ordered by me did show abnormal T changes in the anterior leads not presence from prior.  Pt did not complaint of any pain.  She will likely benefit from an echo at [facility].  I did discuss this with Dr. Rosalia Hammers.   12:53 PM [facility] is here to pick up pt for transfer, however she is increasingly somnolence.  She has respiratory alkalosis.  At this time, we felt she is not stable for transfer.  I did reach out to OBGYN Dr. Shawnie Pons.  She acknowledge the lack of ICU or Step down unit at [facility].  Plan to consult medicine for admission.  I will also order portable CXR to assess for potential aspiration pneumonia.  Order lactic acid and obtain rectal temp and blood culture.   1:31 PM Currently, patient is actively  vomiting.  Initially she was vomiting up coffee-ground emesis and now she is throwing up bright red blood.  This is consistent with an upper GI bleed likely Mallory Weiss tear.  No significant abdominal discomfort when palpated.  Doubt Boerhaave. I have ordered protonix drip, IVF, zofran.    1:55 PM I have consulted with intensivist Dr. Kendrick Fries who agrees to see pt in the ER but request medicine for admission to ICU.  I have also consulted GI specialist DR. Schooler who will be available for consultation.  He felt pt doesn't necessary need to be scoped emergently.  WIll continue with protonix and monitor closely.   3:07 PM Appreciate consultation from [facility] DR. Dhungel who agrees to admit pt to step down unit.     Fayrene Helper, PA-C 11/20/18 1610    Margarita Grizzle, MD 11/22/18 [phone]

## 2018-11-20 NOTE — Progress Notes (Signed)
Patient currently maintaining airway and on RA with O2 sats 98-99%.

## 2018-11-20 NOTE — ED Notes (Signed)
ED Provider at bedside. 

## 2018-11-20 NOTE — H&P (Signed)
TRH H&P   Patient Demographics:    Diane Foley, is a 32 y.o. female  MRN: [id number]   DOB - 02-Aug-1986  Admit Date - 11/20/2018  Outpatient Primary MD for the patient is Patient, No Pcp Per  Referring MD: Dr. Rosalia Hammers  Outpatient Specialists: None  Patient coming from: [facility] police department  Chief Complaint  Patient presents with  . Heroin Withdrawl      HPI:    Diane Foley  is a 32 y.o. female, with polysubstance abuse (heroin and cocaine), hep C antibody positive, PTSD, bipolar disorder, [redacted] weeks pregnant who is currently in custody at [facility] Department for violating parole was brought to the ED from jail unresponsive.  Patient reported using IV heroin yesterday morning as well smoking crack cocaine.   This morning she was found to be drowsy and incontinent of stool this suspected that she may be withdrawing from heroin and was brought to the ED.  No witnessed seizures or fevers.  In the ED she was poorly arousable and was given IV Narcan after which she started to wake up.  Bedside ultrasound done showed normal fetal heart tone and confirmed intrauterine pregnancy.  ([facility] nurse also evaluate patient and found to have fetal heart rate at 154). OB consulted and [facility] and initiate Subutex. Labs done showed potassium of 2.9, WC of 15 K, venous blood gas suggestive of respiratory alkalosis.  Blood work showed hemoglobin of 16.4, urine drug screen was positive for cocaine and opiates, salicylate and Tylenol level were negative.  Beta-hCG was 60 6K.  EKG done showed anterior T wave changes but patient denied any chest pain symptoms. Patient was being transferred to [facility] however she started having several episodes of coffee-ground emesis in the ED.  She was then started on Protonix drip, IV fluid  and Zofran.  PCCM consulted who recommended monitoring her under hospitalist service.  GI was consulted by ED physician.  Given her active upper GI bleed transfer to main hospital was held after discussing with OB (Dr. Shawnie Pons) and to be monitored advised along.  On my evaluation patient is somnolent but easily arousable when she wants to  and does not participate in conversation much.  She denies any pain at this time.     Review of systems:    In addition to the HPI above,  History limited as patient somnolent and not participating in conversation.  As outlined in HPI.  With Past History of the following :    Past Medical History:  Diagnosis Date  . Anxiety   . Bipolar 1 disorder (HCC)    bipolar  . Cocaine abuse (HCC)   . Depression   . Drug abuse (HCC)   . Opioid abuse (HCC)   . PTSD (post-traumatic stress disorder)   . Sepsis (HCC)   .  Sepsis(995.91)   . Suicidal ideation   . Suicidal ideations   . UTI (urinary tract infection)       Past Surgical History:  Procedure Laterality Date  . APPENDECTOMY        Social History:     Social History   Tobacco Use  . Smoking status: Current Every Day Smoker    Packs/day: 0.25    Types: Cigarettes  . Smokeless tobacco: Never Used  Substance Use Topics  . Alcohol use: Yes    Alcohol/week: 1.0 standard drinks    Types: 1 Cans of beer per week    Frequency: Never    Comment: occasionally     Lives - home, currently in custody  Mobility -ambulatory at baseline     Family History :     Family History  Problem Relation Age of Onset  . Cancer Mother       Home Medications:   Prior to Admission medications   Medication Sig Start Date End Date Taking? Authorizing Provider  acetaminophen (TYLENOL) 325 MG tablet Take 2 tablets (650 mg total) by mouth every 6 (six) hours as needed for mild pain or fever (or Fever >/= 101). Patient not taking: Reported on 11/20/2018 12/23/17   Zannie Cove, MD  cephALEXin  (KEFLEX) 250 MG capsule Take 1 capsule (250 mg total) by mouth every 8 (eight) hours. For 3days Patient not taking: Reported on 11/20/2018 12/23/17   Zannie Cove, MD  cloNIDine (CATAPRES) 0.1 MG tablet Take 1 tablet (0.1 mg total) by mouth daily. For 3days Patient not taking: Reported on 11/20/2018 12/23/17   Zannie Cove, MD     Allergies:    No Known Allergies   Physical Exam:   Vitals  Blood pressure 135/87, pulse 78, temperature 99.6 F (37.6 C), temperature source Rectal, resp. rate (!) 37, height 5\' 3"  (1.6 m), weight 45 kg, SpO2 100 %.   General: Middle-aged thin built female somnolent, easily arousable, appears pale HEENT: Pupils dilated bilaterally but reactive to light, EOMI, no pallor, no icterus, dry oral mucosa, supple neck Chest: Clear to auscultation bilaterally, no added sound CVs: Normal S1-S2, no murmurs rub or gallop GI: Soft, mild distention of pregnancy, nontender, bowel sounds present Musculoskeletal: Warm, no edema CNS: Somnolent but easily arousable, does not perform eye contact or participate much in conversation.  Moving all extremities.   Data Review:    CBC Recent Labs  Lab 11/20/18 0916  WBC 15.0*  HGB 16.4*  HCT 48.7*  PLT 430*  MCV 90.9  MCH 30.6  MCHC 33.7  RDW 14.0  LYMPHSABS 1.0  MONOABS 0.2  EOSABS 0.0  BASOSABS 0.0   ------------------------------------------------------------------------------------------------------------------  Chemistries  Recent Labs  Lab 11/20/18 0916  NA 136  K 2.9*  CL 99  CO2 20*  GLUCOSE 135*  BUN 6  CREATININE 0.69  CALCIUM 9.9  AST 21  ALT 17  ALKPHOS 60  BILITOT 0.6   ------------------------------------------------------------------------------------------------------------------ estimated creatinine clearance is 72.4 mL/min (by C-G formula based on SCr of 0.69  mg/dL). ------------------------------------------------------------------------------------------------------------------ No results for input(s): TSH, T4TOTAL, T3FREE, THYROIDAB in the last 72 hours.  Invalid input(s): FREET3  Coagulation profile No results for input(s): INR, PROTIME in the last 168 hours. ------------------------------------------------------------------------------------------------------------------- No results for input(s): DDIMER in the last 72 hours. -------------------------------------------------------------------------------------------------------------------  Cardiac Enzymes No results for input(s): CKMB, TROPONINI, MYOGLOBIN in the last 168 hours.  Invalid input(s): CK ------------------------------------------------------------------------------------------------------------------ No results found for: BNP   ---------------------------------------------------------------------------------------------------------------  Urinalysis    Component  Value Date/Time   COLORURINE AMBER (A) 12/20/2017 0130   APPEARANCEUR CLOUDY (A) 12/20/2017 0130   APPEARANCEUR Hazy 04/03/2014 2109   LABSPEC 1.024 12/20/2017 0130   LABSPEC 1.014 04/03/2014 2109   PHURINE 7.0 12/20/2017 0130   GLUCOSEU NEGATIVE 12/20/2017 0130   GLUCOSEU Negative 04/03/2014 2109   HGBUR NEGATIVE 12/20/2017 0130   BILIRUBINUR NEGATIVE 12/20/2017 0130   BILIRUBINUR Negative 04/03/2014 2109   KETONESUR 80 (A) 12/20/2017 0130   PROTEINUR 30 (A) 12/20/2017 0130   UROBILINOGEN 4.0 (H) 06/13/2013 1953   NITRITE NEGATIVE 12/20/2017 0130   LEUKOCYTESUR SMALL (A) 12/20/2017 0130   LEUKOCYTESUR Trace 04/03/2014 2109    ----------------------------------------------------------------------------------------------------------------   Imaging Results:    Dg Chest Portable 1 View  Result Date: 11/20/2018 CLINICAL DATA:  Nausea, vomiting common diarrhea. EXAM: PORTABLE CHEST 1 VIEW  COMPARISON:  None. FINDINGS: The heart size and mediastinal contours are within normal limits. Both lungs are clear. The visualized skeletal structures are unremarkable. IMPRESSION: No active disease. Electronically Signed   By: Gerome Sam III M.D   On: 11/20/2018 13:24    My personal review of EKG:  Normal sinus rhythm at 76, T wave inversion in inferior and lateral leads.   Assessment & Plan:    Principal Problem:   Acute metabolic encephalopathy  Suspicious for acute narcotic withdrawal.  Maintaining airway.  Mental status started to improve after receiving a dose of IV Narcan in the ED.  Discussed with Dr. Shawnie Pons [facility]) who recommended acute opiate withdrawal as poor outcome for the fetus.  Recommending starting her with 4 mg of buprenorphine and then another dose if needed at 2-hour mark.  (8-16 mg divided daily). Recommend to use Zofran or clonidine for symptom control Rapid [facility] nurse will come daily to assess fetal heart rate.  She will consult formally in a.m.  Stepdown monitoring for now.  Continue neurochecks and seizure precaution.   active Problems: Acute upper GI bleed Reportedly had several bouts of coffee-ground emesis in the ED.  Hemoglobin currently stable.  Keep n.p.o.  IV Protonix every 12 hours.  Serial H&H monitoring every 6-8 hours.  GI consulted by ED.  Hypokalemia/hypomagnesemia Replenished with IV fluids and potassium supplement.  EKG shows T wave inversion in inferior lateral leads.  No chest pain symptoms.  Monitor on telemetry.  Respiratory alkalosis Secondary to narcotic withdrawal.  Monitor for now.  PCCM consult appreciated.  [redacted] weeks gestation of pregnancy As above.  OB will consult in a.m.  Rapid OB nurse to monitor FHT daily.  Substance abuse (heroin/cocaine) As above.  Close monitoring for withdrawal.  Leukocytosis Possibly stress response.  UA and chest x-ray without any infiltrate    Mood disorder (HCC)   MDD (major depressive disorder),  severe (HCC)   Opioid type dependence, continuous (HCC)     Hepatitis C antibody test positive Not on any treatment.    DVT Prophylaxis: SCDs  AM Labs Ordered, also please review Full Orders  Family Communication: Admission, patients condition and plan of care including tests being ordered have been discussed with the patient. Code Status full code  Likely DC to return to custody once stable  Condition GUARDED    Consults called: PCCM, OB (Dr. Shawnie Pons), lebeaur GI pain consulted by ED)  Admission status: Inpatient  Patient presenting with acute metabolic encephalopathy possibly in the setting of narcotic withdrawal, respiratory alkalosis, severe hypokalemia, hypomagnesemia, and upper GI bleed.  Patient needs close stepdown monitoring for active narcotic withdrawal, IV fluids, IV Protonix  with serial H&H monitoring and electrolytes that will require at least >2 midnight stay in the hospital.  Time spent: 70 minutes   Merdith Boyd M.D on 11/20/2018 at 3:38 PM  Between 7am to 7pm - Pager - [phone]. After 7pm go to [email or web address] - password [email or web address]  [facility] - Office  [phone]

## 2018-11-20 NOTE — Progress Notes (Signed)
Spoke with Dr. Shawnie PonsPratt. I have gotten a FHR of 158, but am unable to monitor pt due to her position.Dr. Shawnie PonsPratt wants to speak with the ED MD in order to make a plan of care for the pt.

## 2018-11-20 NOTE — ED Notes (Signed)
[facility] has been called and will be in route ASAP.

## 2018-11-20 NOTE — ED Notes (Signed)
Rapid OB RN at bedside with patient.  

## 2018-11-20 NOTE — ED Notes (Signed)
RT has been called and are running VBG.

## 2018-11-20 NOTE — Progress Notes (Signed)
[facility]  Impression: Acute opioid intoxication/withdrawal Altered mental status Pregnancy at unknown gestational age  Recommendations: Acute opioid withdrawal can have poor consequence for pregnancy and is not recommended. If patient is interested in MAT for OUD, consider starting her on Subutex, would start with 4 mg, then give more if needed at 2 hour mark. Most patients need 8-16 mg divided daily. Patient may have Zofran or Clonidine as needed for symptom control. Will have ROB come daily for assessing FHT's Will need dating u/s Will come by for formal consult in am.  Thank you so much for allowing us to participate in the care of this patient. Please call the attending OB/GYN physician with questions or concerns at [phone].

## 2018-11-20 NOTE — Consult Note (Signed)
NAME:  Diane Foley, MRN:  [id number], DOB:  [birth year], LOS: 0 ADMISSION DATE:  11/20/2018, CONSULTATION DATE: November 20, 2018 REFERRING MD: Dr. Rosalia Hammers, CHIEF COMPLAINT: Found down  Brief History   32 year old female with a past medical history significant for polysubstance abuse was brought from jail unresponsive, vomiting.  Has history of recent IV heroin use as well as cocaine use.  History of present illness   32 year old female with a past medical history significant for bipolar disorder and polysubstance abuse who happens to be pregnant at [redacted] weeks by report was brought from jail today to the [facility] long emergency department in the setting of unresponsiveness, vomiting.  She had been brought into jail for parole violation.  She has a long history of polysubstance abuse including IV heroin use and cocaine abuse.  In the emergency department she was noted to be encephalopathic, vomiting coffee-ground emesis, and confused.  She received IV fluids, IV Narcan.  Since then her mental status is improved to the point where she is able to talk intermittently.  She says she is no longer nauseated.  She is asking to lie flat.  She denies any pain.  Apart from that she is unable to provide history  Past Medical History  Polysubstance abuse, PTSD, depression, bipolar disorder  Significant Hospital Events   November 20, 2018 admission  Consults:  December 14 pulmonary and critical care medicine  Procedures:  None  Significant Diagnostic Tests:  None  Micro Data:  December 14 blood culture  Antimicrobials:  None  Interim history/subjective:  As above  Objective   Blood pressure (!) 142/82, pulse 76, temperature 99.6 F (37.6 C), temperature source Rectal, resp. rate (!) 34, height 5\' 3"  (1.6 m), weight 45 kg, SpO2 100 %.        Intake/Output Summary (Last 24 hours) at 11/20/2018 1439 Last data filed at 11/20/2018 1019 Gross per 24 hour  Intake 1000 ml  Output -  Net 1000 ml    Filed Weights   11/20/18 0831  Weight: 45 kg    Examination:  Gen: chronically ill appearing, lying in bed HENT: NCAT, OP clear, neck supple without masses Eyes: PERRL, EOMi Lymph: no cervical lymphadenopathy PULM: CTA B, tachypnea but no accessory muscle use, no paradoxical breathing, she is able to speak in full sentences without breathlessness CV: RRR, no mgr, no JVD GI: BS+, soft, nontender, no hsm Derm: multiple extensor surface abrasions, some punctate wounds noted flexor surfaces arms MSK: normal bulk and tone Neuro:Sleepy but arouses, will answer questions, cranial nerves grossly intact, moves all four extremities spontaneously Psyche: normal mood and affect   Resolved Hospital Problem list    Assessment & Plan:  Nausea and vomiting: Concern for upper GI bleed based on coffee-ground emesis, no witnessed bright red blood Monitor CBC closely Agree with pantoprazole GI notified by the ER Gentle IV fluids PRN Zofran  Respiratory alkalosis: due to drug withdrawal, nausea and pregnancy; no signs of respiratory muscle failure on exam, oxygenation reassuring, lungs clear Monitor respiratory statu  Acute encephalopathy: Improving, syndrome is consistent with polysubstance withdrawal, though with improvement from Narcan question whether or not she could have taking long-acting narcotics Close monitoring, recommend admission to stepdown Gentle IV fluids Minimize sedating medicines If mental status declines use Narcan again  Pregnancy: Check fetal heart tones per nursing unit routine Multivitamin  OK for admission by hospitalist from my standpoint, we are available as needed  Best practice:  Per hospitalists  Labs   CBC:  Recent Labs  Lab 11/20/18 0916  WBC 15.0*  NEUTROABS 13.6*  HGB 16.4*  HCT 48.7*  MCV 90.9  PLT 430*    Basic Metabolic Panel: Recent Labs  Lab 11/20/18 0916  NA 136  K 2.9*  CL 99  CO2 20*  GLUCOSE 135*  BUN 6  CREATININE 0.69   CALCIUM 9.9   GFR: Estimated Creatinine Clearance: 72.4 mL/min (by C-G formula based on SCr of 0.69 mg/dL). Recent Labs  Lab 11/20/18 0916 11/20/18 1355  WBC 15.0*  --   LATICACIDVEN  --  1.53    Liver Function Tests: Recent Labs  Lab 11/20/18 0916  AST 21  ALT 17  ALKPHOS 60  BILITOT 0.6  PROT 9.6*  ALBUMIN 4.6   No results for input(s): LIPASE, AMYLASE in the last 168 hours. No results for input(s): AMMONIA in the last 168 hours.  ABG    Component Value Date/Time   PHART 7.494 (H) 12/20/2017 0213   PCO2ART 27.2 (L) 12/20/2017 0213   PO2ART 95.0 12/20/2017 0213   HCO3 19.4 (L) 11/20/2018 1104   TCO2 22 12/20/2017 0213   ACIDBASEDEF 2.2 (H) 11/20/2018 1104   O2SAT 85.4 11/20/2018 1104     Coagulation Profile: No results for input(s): INR, PROTIME in the last 168 hours.  Cardiac Enzymes: No results for input(s): CKTOTAL, CKMB, CKMBINDEX, TROPONINI in the last 168 hours.  HbA1C: No results found for: HGBA1C  CBG: No results for input(s): GLUCAP in the last 168 hours.  Review of Systems:   Cannot obtain  Past Medical History  She,  has a past medical history of Anxiety, Bipolar 1 disorder (HCC), Cocaine abuse (HCC), Depression, Drug abuse (HCC), Opioid abuse (HCC), PTSD (post-traumatic stress disorder), Sepsis (HCC), Sepsis(995.91), Suicidal ideation, Suicidal ideations, and UTI (urinary tract infection).   Surgical History    Past Surgical History:  Procedure Laterality Date  . APPENDECTOMY       Social History   reports that she has been smoking cigarettes. She has been smoking about 0.25 packs per day. She has never used smokeless tobacco. She reports current alcohol use of about 1.0 standard drinks of alcohol per week. She reports current drug use. Drugs: Heroin and IV.   Family History   Her family history includes Cancer in her mother.   Allergies No Known Allergies   Home Medications  Prior to Admission medications   Medication Sig Start  Date End Date Taking? Authorizing Provider  acetaminophen (TYLENOL) 325 MG tablet Take 2 tablets (650 mg total) by mouth every 6 (six) hours as needed for mild pain or fever (or Fever >/= 101). 12/23/17   Zannie CoveJoseph, Preetha, MD  cephALEXin (KEFLEX) 250 MG capsule Take 1 capsule (250 mg total) by mouth every 8 (eight) hours. For 3days 12/23/17   Zannie CoveJoseph, Preetha, MD  cloNIDine (CATAPRES) 0.1 MG tablet Take 1 tablet (0.1 mg total) by mouth daily. For 3days 12/23/17   Zannie CoveJoseph, Preetha, MD  dicyclomine (BENTYL) 20 MG tablet Take 1 tablet (20 mg total) by mouth 4 (four) times daily -  before meals and at bedtime. 08/02/17 08/07/17  Shaune PollackIsaacs, Cameron, MD    Discussed with ER PA  Critical care time: 37 minutes    Heber CarolinaBrent McQuaid, MD [place] PCCM Pager: [phone] Cell: [phone] If no response, call [phone]

## 2018-11-20 NOTE — ED Notes (Signed)
Sheriff Deputy at bedside with patient.

## 2018-11-20 NOTE — ED Notes (Signed)
Pt is unresponsive when [facility] showed up. MD to bedside .4 Narcan given to patient 1L NS bolus started. Decided by MD [facility].

## 2018-11-20 NOTE — ED Notes (Signed)
Report given to Emergency planning/management [facility]Christina [facility].

## 2018-11-20 NOTE — Progress Notes (Signed)
I have been told by the staff that the pt is 24 weeks, but there is no documentation as to where that information came from. Pt is withdrawing from heroin. [place] has been there one day according to the officer that is with her. I don't think she has had any prenatal care.No vaginal bleeding noted. Pt is wearing a depends and has soiled herself.

## 2018-11-20 NOTE — ED Notes (Signed)
[facility] has been called and are in route to assess patient.  Patient is [redacted] weeks pregnant.

## 2018-11-21 ENCOUNTER — Encounter: Payer: Self-pay | Admitting: *Deleted

## 2018-11-21 DIAGNOSIS — O99322 Drug use complicating pregnancy, second trimester: Principal | ICD-10-CM

## 2018-11-21 DIAGNOSIS — Z3A21 21 weeks gestation of pregnancy: Secondary | ICD-10-CM

## 2018-11-21 LAB — CBC
HEMATOCRIT: 33.6 % — AB (ref 36.0–46.0)
Hemoglobin: 11.1 g/dL — ABNORMAL LOW (ref 12.0–15.0)
MCH: 31.5 pg (ref 26.0–34.0)
MCHC: 33 g/dL (ref 30.0–36.0)
MCV: 95.5 fL (ref 80.0–100.0)
Platelets: 316 10*3/uL (ref 150–400)
RBC: 3.52 MIL/uL — ABNORMAL LOW (ref 3.87–5.11)
RDW: 14.6 % (ref 11.5–15.5)
WBC: 17.9 10*3/uL — ABNORMAL HIGH (ref 4.0–10.5)
nRBC: 0 % (ref 0.0–0.2)

## 2018-11-21 LAB — BASIC METABOLIC PANEL
Anion gap: 10 (ref 5–15)
Anion gap: 8 (ref 5–15)
BUN: 7 mg/dL (ref 6–20)
BUN: 6 mg/dL (ref 6–20)
CO2: 15 mmol/L — ABNORMAL LOW (ref 22–32)
CO2: 17 mmol/L — AB (ref 22–32)
Calcium: 7.3 mg/dL — ABNORMAL LOW (ref 8.9–10.3)
Calcium: 7.9 mg/dL — ABNORMAL LOW (ref 8.9–10.3)
Chloride: 110 mmol/L (ref 98–111)
Chloride: 112 mmol/L — ABNORMAL HIGH (ref 98–111)
Creatinine, Ser: 0.48 mg/dL (ref 0.44–1.00)
Creatinine, Ser: 0.4 mg/dL — ABNORMAL LOW (ref 0.44–1.00)
GFR calc Af Amer: >60 mL/min (ref >60)
GFR calc Af Amer: >60 mL/min (ref >60)
GFR calc non Af Amer: >60 mL/min (ref >60)
GFR calc non Af Amer: >60 mL/min (ref >60)
GLUCOSE: 145 mg/dL — AB (ref 70–99)
Glucose, Bld: 125 mg/dL — ABNORMAL HIGH (ref 70–99)
Potassium: 3.4 mmol/L — ABNORMAL LOW (ref 3.5–5.1)
Potassium: 3.4 mmol/L — ABNORMAL LOW (ref 3.5–5.1)
Sodium: 135 mmol/L (ref 135–145)
Sodium: 137 mmol/L (ref 135–145)

## 2018-11-21 LAB — RAPID HIV SCREEN (HIV 1/2 AB+AG)
HIV 1/2 Antibodies: NONREACTIVE
HIV-1 P24 ANTIGEN - HIV24: NONREACTIVE

## 2018-11-21 LAB — HEMOGLOBIN AND HEMATOCRIT, BLOOD
HCT: 31.4 % — ABNORMAL LOW (ref 36.0–46.0)
Hemoglobin: 10.5 g/dL — ABNORMAL LOW (ref 12.0–15.0)

## 2018-11-21 LAB — GLUCOSE, CAPILLARY: Glucose-Capillary: 107 mg/dL — ABNORMAL HIGH (ref 70–99)

## 2018-11-21 LAB — MRSA PCR SCREENING: MRSA by PCR: NEGATIVE

## 2018-11-21 MED ORDER — SODIUM CHLORIDE 0.9% FLUSH: 10.0000 mL | INTRAVENOUS | Status: DC | PRN

## 2018-11-21 MED ORDER — LORAZEPAM 2 MG/ML IJ SOLN
2.0000 mg | Freq: Once | INTRAMUSCULAR | Status: AC
  Administered 2018-11-21: 2 mg via INTRAMUSCULAR

## 2018-11-21 MED ORDER — METHADONE HCL 10 MG PO TABS
10.0000 mg | ORAL_TABLET | Freq: Every day | ORAL | Status: DC
  Administered 2018-11-21 – 2018-11-22 (×2): 10 mg via ORAL
  Filled 2018-11-21 (×2): qty 1

## 2018-11-21 MED ORDER — POTASSIUM CHLORIDE 10 MEQ/100ML IV SOLN
10.0000 meq | INTRAVENOUS | Status: AC
  Administered 2018-11-21 (×2): 10 meq via INTRAVENOUS

## 2018-11-21 MED ORDER — BUPRENORPHINE HCL-NALOXONE HCL 2-0.5 MG SL SUBL: 2.0000 | SUBLINGUAL_TABLET | SUBLINGUAL | Status: DC | PRN

## 2018-11-21 MED ORDER — BUPRENORPHINE HCL-NALOXONE HCL 8-2 MG SL SUBL: 1.0000 | SUBLINGUAL_TABLET | Freq: Two times a day (BID) | SUBLINGUAL | Status: DC

## 2018-11-21 MED ORDER — BUPRENORPHINE HCL-NALOXONE HCL 2-0.5 MG SL SUBL: 2.0000 | SUBLINGUAL_TABLET | Freq: Two times a day (BID) | SUBLINGUAL | Status: DC | PRN

## 2018-11-21 MED ORDER — SODIUM CHLORIDE 0.9% FLUSH
10.0000 mL | Freq: Two times a day (BID) | INTRAVENOUS | Status: DC
  Administered 2018-11-21 – 2018-11-22 (×3): 10 mL

## 2018-11-21 MED ORDER — METHADONE HCL 10 MG PO TABS
10.0000 mg | ORAL_TABLET | Freq: Every day | ORAL | Status: DC | PRN
  Administered 2018-11-21: 10 mg via ORAL
  Filled 2018-11-21: qty 1

## 2018-11-21 MED ORDER — LORAZEPAM 2 MG/ML IJ SOLN: 0.5000 mg | Freq: Three times a day (TID) | INTRAMUSCULAR | Status: DC | PRN

## 2018-11-21 MED ORDER — POTASSIUM CHLORIDE 10 MEQ/100ML IV SOLN
INTRAVENOUS | Status: AC
  Administered 2018-11-21: 10 meq via INTRAVENOUS
  Filled 2018-11-21: qty 300

## 2018-11-21 MED ORDER — LORAZEPAM 2 MG/ML IJ SOLN
INTRAMUSCULAR | Status: AC
  Administered 2018-11-21: 2 mg via INTRAMUSCULAR
  Filled 2018-11-21: qty 1

## 2018-11-21 MED ORDER — OLANZAPINE 5 MG PO TBDP
5.0000 mg | ORAL_TABLET | Freq: Two times a day (BID) | ORAL | Status: DC | PRN
  Filled 2018-11-21: qty 1

## 2018-11-21 NOTE — Progress Notes (Addendum)
[facility] PROGRESS NOTE  Kayra Crowell GNF:[id number] DOB: 25-Apr-1986 DOA: 11/20/2018  PCP: Patient, No Pcp Per  Brief History/Interval Summary: 32 y.o. female, with polysubstance abuse (heroin and cocaine), hep C antibody positive, PTSD, bipolar disorder, [redacted] weeks pregnant who is currently in custody at [facility] for violating parole was brought to the ED from jail unresponsive.  Patient reported using IV heroin the day prior to admission along with smoking crack cocaine.  On the morning of admission she was found to be drowsy and incontinent of stool this suspected that she may be withdrawing from heroin and was brought to the ED.   after discussions with the OB/GYN patient was to be transferred to [facility] to be considered for MAT in a monitored setting.  However she started having coffee-ground emesis and so she was hospitalized here.    Reason for Visit: Opioid withdrawal  Consultants: OB/GYN.  Psychiatry  Procedures: None  Antibiotics: None  Subjective/Interval History: On arrival to patient's room there were multiple personnel from security and nursing staff outside the room.  Apparently this morning patient started spitting and lashing out at the nursing staff.  She was noted to have aggressive behavior.  Patient had to be restrained.  She had to be given Ativan as there was significant risk to harm self as well as others.  Patient subsequently calmed down.  ROS: Unable to do due to aggressive behavior  Objective:  Vital Signs  Vitals:   11/21/18 0355 11/21/18 0600 11/21/18 0700 11/21/18 0800  BP:  129/78 135/83 (!) 143/79  Pulse:  76 76 (!) 102  Resp:  (!) 29 (!) 27 (!) 30  Temp: 98.4 F (36.9 C)   97.8 F (36.6 C)  TempSrc: Axillary   Axillary  SpO2:  99% 99% 100%  Weight:      Height:        Intake/Output Summary (Last 24 hours) at 11/21/2018 1036 Last data filed at 11/21/2018 0300 Gross per 24 hour  Intake 2799.19 ml    Output -  Net 2799.19 ml   Filed Weights   11/20/18 0831  Weight: 45 kg   Examination was limited due to patient's aggressive behavior. Vital signs were noted to be stable As mentioned above. She was noted to have normal respiratory rate.  Saturations were normal.  She was moving all her extremities.  Lab Results:  Data Reviewed: I have personally reviewed following labs and imaging studies  CBC: Recent Labs  Lab 11/20/18 0916 11/21/18 0254  WBC 15.0* 17.9*  NEUTROABS 13.6*  --   HGB 16.4* 11.1*  HCT 48.7* 33.6*  MCV 90.9 95.5  PLT 430* 316    Basic Metabolic Panel: Recent Labs  Lab 11/20/18 0916 11/21/18 0054  NA 136 135  K 2.9* 3.4*  CL 99 110  CO2 20* 15*  GLUCOSE 135* 145*  BUN 6 7  CREATININE 0.69 0.48  CALCIUM 9.9 7.3*    GFR: Estimated Creatinine Clearance: 72.4 mL/min (by C-G formula based on SCr of 0.48 mg/dL).  Liver Function Tests: Recent Labs  Lab 11/20/18 0916  AST 21  ALT 17  ALKPHOS 60  BILITOT 0.6  PROT 9.6*  ALBUMIN 4.6    CBG: Recent Labs  Lab 11/21/18 0908  GLUCAP 107*     Recent Results (from the past 240 hour(s))  Blood culture (routine x 2)     Status: None (Preliminary result)   Collection Time: 11/20/18  1:18 PM  Result Value Ref  Range Status   Specimen Description   Final    BLOOD RIGHT ANTECUBITAL Performed at [facility], [address] [place], [place] [address]    Special Requests   Final    BOTTLES DRAWN AEROBIC AND ANAEROBIC Blood Culture adequate volume Performed at [facility], [address], [place], [place] [address]    Culture PENDING  Incomplete   Report Status PENDING  Incomplete      Radiology Studies: US Ob Limited  Result Date: 11/20/2018 CLINICAL DATA:  Pregnancy complicated by maternal drug use. EXAM: LIMITED OBSTETRIC ULTRASOUND FINDINGS: Number of Fetuses: 1 Heart Rate:  156 bpm Movement: Yes Presentation: Variable/cephalic Placental Location: Posterior  Previa: No Amniotic Fluid (Subjective):  Within normal limits. BPD: 5.04 cm 21 w  2 d MATERNAL FINDINGS: Cervix:  Appears closed. Uterus/Adnexae: The uterus is grossly unremarkable. A fluid-filled structure with debris is favored to represent the bladder. IMPRESSION: 1. Single live IUP. 2. Fluid-filled structure with debris is favored to represent the bladder. However, the patient has some sort a catheter and has been using the restroom which suggests that if this is the bladder it is emptying incompletely. Recommend clinical correlation and attention on follow-up. This exam is performed on an emergent basis and does not comprehensively evaluate fetal size, dating, or anatomy; follow-up complete OB US should be considered if further fetal assessment is warranted. Electronically Signed   By: Gerome Sam III M.D   On: 11/20/2018 18:24   Dg Chest Portable 1 View  Result Date: 11/20/2018 CLINICAL DATA:  Nausea, vomiting common diarrhea. EXAM: PORTABLE CHEST 1 VIEW COMPARISON:  None. FINDINGS: The heart size and mediastinal contours are within normal limits. Both lungs are clear. The visualized skeletal structures are unremarkable. IMPRESSION: No active disease. Electronically Signed   By: Gerome Sam III M.D   On: 11/20/2018 13:24     Medications:  Scheduled: . pantoprazole (PROTONIX) IV  40 mg Intravenous Q12H   Continuous: . 0.9 % NaCl with KCl 40 mEq / L Stopped (11/20/18 1712)  . potassium chloride     UJW:[id number] **OR** acetaminophen    Assessment/Plan:  Acute metabolic encephalopathy-aggressive behavior Unclear if this morning's aggressive behavior was due to encephalopathy or just behavioral issue.  According to nursing staff patient did not have any further episodes of vomiting overnight.  She was calm up until this morning.  Patient mentation has improved and aggression has resolved with Ativan.  Dose of Ativan was discussed with OB prior to administration.  Will consult  psychiatry to assist with management.  Concern for opioid withdrawal in a pregnant state/opioid use disorder OB/GYN to formally consult on the patient but they do recommend MAT.  Patient was given 2 doses of Suboxone overnight.  Since she had to be given Ativan this morning we will monitor for now and will consider initiating Suboxone later today.  Otherwise symptomatic treatment for withdrawal symptoms.  Zofran or clonidine can be used as well per OB.  Coffee-ground emesis Patient was noted to have coffee-ground emesis when she was about to be transported to [facility].  No overt hematemesis has been noted.  No further episodes of hematemesis since last night.  Hemoglobin is noted to be low this morning but the initial level may have been hemoconcentrated.  She might have had a small Mallory-Weiss tear.  Continue with PPI.  No clear indication for endoscopy currently.  Watch for melanotic stool.  Advance diet.  Recheck hemoglobin later today.  Hypokalemia and hypomagnesemia These were repleted.  Potassium improved to 3.4.  She will be given additional doses.  Normal anion gap metabolic acidosis Most likely due to her acute illness and vomiting.  Recheck labs later today.  Continue hydration.  She has had poor venous access.  A midline to be placed.  Leukocytosis She is afebrile.  No obvious source of infection.  This could be reactive.  Chest x-ray did not show any infection.  UA will be ordered.  [redacted] weeks gestation Management per OB.  Mood disorder/major depressive disorder In view of her aggressive behavior we will consult psychiatry to assist with management.  Positive hepatitis C antibody Check HIV.   DVT Prophylaxis: SCD's    Code Status: Full code Family Communication: No family at bedside Disposition Plan: Management as outlined above    LOS: 1 day   Osvaldo ShipperGokul Rosco Harriott  [facility] Pager [phone] 11/21/2018, 10:36 AM  If 7PM-7AM, please contact night-coverage  at [email or web address], password [email or web address]

## 2018-11-21 NOTE — Consult Note (Signed)
[facility]   Reason for Consult:  Aggressive behavior Referring Physician:  Dr. Georgiana SpinnerGokul Patient Identification: Diane ShellHolly Foley MRN:  [id number] Principal Diagnosis: Acute metabolic encephalopathy Diagnosis:  Principal Problem:   Acute metabolic encephalopathy Active Problems:   Mood disorder (HCC)   MDD (major depressive disorder), severe (HCC)   Opioid type dependence, continuous (HCC)   Hypokalemia   Polysubstance abuse (HCC)   Hepatitis C antibody test positive   Hypomagnesemia   Acute narcotic withdrawal (HCC)   Acute upper GI bleed   [redacted] weeks gestation of pregnancy   Total Time spent with patient: 20 minutes  Subjective:   Diane Foley is a 32 y.o. female patient admitted with altered mental status.  HPI:  Patient unable to participate in psychiatric interview due to sedation from Ativan- per staff nurse report. Per chart review, she is a known Substance abuser (heroin and cocaine) with history of Hep C antibody positive, PTSD, Bipolar disorder who is  [redacted] weeks pregnant [facility]. Urine toxicology is positive for opiate and cocaine. Per nurses reports, patient has been combative and aggressive.   Past Psychiatric History: as in HPI  Risk to Self:  unable to assess Risk to Others:    unable to assess Prior Inpatient Therapy:    unable to assess Prior Outpatient Therapy:    unable to assess  Past Medical History:  Past Medical History:  Diagnosis Date  . [redacted] weeks gestation of pregnancy 11/20/2018  . Anxiety   . Bipolar 1 disorder (HCC)    bipolar  . Cocaine abuse (HCC)   . Depression   . Drug abuse (HCC)   . Opioid abuse (HCC)   . PTSD (post-traumatic stress disorder)   . Sepsis (HCC)   . Sepsis(995.91)   . Suicidal ideation   . Suicidal ideations   . UTI (urinary tract infection)     Past Surgical History:  Procedure Laterality Date  . APPENDECTOMY     Family History:  Family  History  Problem Relation Age of Onset  . Cancer Mother    Family Psychiatric  History:  Social History:  Social History   Substance and Sexual Activity  Alcohol Use Yes  . Alcohol/week: 1.0 standard drinks  . Types: 1 Cans of beer per week  . Frequency: Never   Comment: occasionally     Social History   Substance and Sexual Activity  Drug Use Yes  . Types: Heroin, IV   Comment: before being arrested, today is 11/20/2018    Social History   Socioeconomic History  . Marital status: Single    Spouse name: Not on file  . Number of children: Not on file  . Years of education: Not on file  . Highest education level: Not on file  Occupational History  . Not on file  Social Needs  . Financial resource strain: Not on file  . Food insecurity:    Worry: Not on file    Inability: Not on file  . Transportation needs:    Medical: Not on file    Non-medical: Not on file  Tobacco Use  . Smoking status: Current Every Day Smoker    Packs/day: 0.25    Types: Cigarettes  . Smokeless tobacco: Never Used  Substance and Sexual Activity  . Alcohol use: Yes    Alcohol/week: 1.0 standard drinks    Types: 1 Cans of beer per week    Frequency: Never  Comment: occasionally  . Drug use: Yes    Types: Heroin, IV    Comment: before being arrested, today is 11/20/2018  . Sexual activity: Yes    Birth control/protection: Condom  Lifestyle  . Physical activity:    Days per week: Not on file    Minutes per session: Not on file  . Stress: Not on file  Relationships  . Social connections:    Talks on phone: Not on file    Gets together: Not on file    Attends religious service: Not on file    Active member of club or organization: Not on file    Attends meetings of clubs or organizations: Not on file    Relationship status: Not on file  Other Topics Concern  . Not on file  Social History Narrative   ** Merged History Encounter **       Additional Social History:     Allergies:  No Known Allergies  Labs:  Results for orders placed or performed during the hospital encounter of 11/20/18 (from the past 48 hour(s))  Comprehensive metabolic panel     Status: Abnormal   Collection Time: 11/20/18  9:16 AM  Result Value Ref Range   Sodium 136 135 - 145 mmol/L   Potassium 2.9 (L) 3.5 - 5.1 mmol/L   Chloride 99 98 - 111 mmol/L   CO2 20 (L) 22 - 32 mmol/L   Glucose, Bld 135 (H) 70 - 99 mg/dL   BUN 6 6 - 20 mg/dL   Creatinine, Ser 1.61 0.44 - 1.00 mg/dL   Calcium 9.9 8.9 - 09.6 mg/dL   Total Protein 9.6 (H) 6.5 - 8.1 g/dL   Albumin 4.6 3.5 - 5.0 g/dL   AST 21 15 - 41 U/L   ALT 17 0 - 44 U/L   Alkaline Phosphatase 60 38 - 126 U/L   Total Bilirubin 0.6 0.3 - 1.2 mg/dL   GFR calc non Af Amer >60 >60 mL/min   GFR calc Af Amer >60 >60 mL/min   Anion gap 17 (H) 5 - 15    Comment: Performed at [facility], [address], [place], [place] [address]  Ethanol     Status: None   Collection Time: 11/20/18  9:16 AM  Result Value Ref Range   Alcohol, Ethyl (B) <10 <10 mg/dL    Comment: (NOTE) Lowest detectable limit for serum alcohol is 10 mg/dL. For medical purposes only. Performed at [facility], [address], [place], [place] [address]   CBC with Differential     Status: Abnormal   Collection Time: 11/20/18  9:16 AM  Result Value Ref Range   WBC 15.0 (H) 4.0 - 10.5 K/uL   RBC 5.36 (H) 3.87 - 5.11 MIL/uL   Hemoglobin 16.4 (H) 12.0 - 15.0 g/dL   HCT 14.7 (H) 82.9 - 56.2 %   MCV 90.9 80.0 - 100.0 fL   MCH 30.6 26.0 - 34.0 pg   MCHC 33.7 30.0 - 36.0 g/dL   RDW 13.0 86.5 - 78.4 %   Platelets 430 (H) 150 - 400 K/uL   nRBC 0.0 0.0 - 0.2 %   Neutrophils Relative % 91 %   Neutro Abs 13.6 (H) 1.7 - 7.7 K/uL   Lymphocytes Relative 7 %   Lymphs Abs 1.0 0.7 - 4.0 K/uL   Monocytes Relative 1 %   Monocytes Absolute 0.2 0.1 - 1.0 K/uL   Eosinophils Relative 0 %   Eosinophils  Absolute 0.0 0.0 - 0.5 K/uL   Basophils  Relative 0 %   Basophils Absolute 0.0 0.0 - 0.1 K/uL   Immature Granulocytes 1 %   Abs Immature Granulocytes 0.12 (H) 0.00 - 0.07 K/uL    Comment: Performed at [facility], [address], [place], [place] [address]  hCG, quantitative, pregnancy     Status: Abnormal   Collection Time: 11/20/18  9:16 AM  Result Value Ref Range   hCG, Beta Chain, Quant, S 66,336 (H) <5 mIU/mL    Comment:          GEST. AGE      CONC.  (mIU/mL)   <=1 WEEK        5 - 50     2 WEEKS       50 - 500     3 WEEKS       100 - 10,000     4 WEEKS     1,000 - 30,000     5 WEEKS     3,500 - 115,000   6-8 WEEKS     12,000 - 270,000    12 WEEKS     15,000 - 220,000        FEMALE AND NON-PREGNANT FEMALE:     LESS THAN 5 mIU/mL Performed at [facility], [address], [place], [place] [address]   Rapid urine drug screen (hospital performed)     Status: Abnormal   Collection Time: 11/20/18  9:16 AM  Result Value Ref Range   Opiates POSITIVE (A) NONE DETECTED   Cocaine POSITIVE (A) NONE DETECTED   Benzodiazepines NONE DETECTED NONE DETECTED   Amphetamines NONE DETECTED NONE DETECTED   Tetrahydrocannabinol NONE DETECTED NONE DETECTED   Barbiturates NONE DETECTED NONE DETECTED    Comment: (NOTE) DRUG SCREEN FOR MEDICAL PURPOSES ONLY.  IF CONFIRMATION IS NEEDED FOR ANY PURPOSE, NOTIFY LAB WITHIN 5 DAYS. LOWEST DETECTABLE LIMITS FOR URINE DRUG SCREEN Drug Class                     Cutoff (ng/mL) Amphetamine and metabolites    1000 Barbiturate and metabolites    200 Benzodiazepine                 200 Tricyclics and metabolites     300 Opiates and metabolites        300 Cocaine and metabolites        300 THC                            50 Performed at [facility], [address] [place], [place] [address]   Acetaminophen level     Status: Abnormal   Collection Time: 11/20/18  9:16 AM  Result Value Ref Range   Acetaminophen (Tylenol), Serum <10 (L)  10 - 30 ug/mL    Comment: (NOTE) Therapeutic concentrations vary significantly. A range of 10-30 ug/mL  may be an effective concentration for many patients. However, some  are best treated at concentrations outside of this range. Acetaminophen concentrations >150 ug/mL at 4 hours after ingestion  and >50 ug/mL at 12 hours after ingestion are often associated with  toxic reactions. Performed at [facility], [address] [place], [place] [address]   Salicylate level     Status: None   Collection Time: 11/20/18  9:16 AM  Result Value Ref Range   Salicylate  Lvl <7.0 2.8 - 30.0 mg/dL    Comment: Performed at [facility], [address] [place], [place] [address]  Blood gas, venous     Status: Abnormal   Collection Time: 11/20/18 11:04 AM  Result Value Ref Range   pH, Ven 7.500 (H) 7.250 - 7.430   pCO2, Ven 25.1 (L) 44.0 - 60.0 mmHg   pO2, Ven 48.3 (H) 32.0 - 45.0 mmHg   Bicarbonate 19.4 (L) 20.0 - 28.0 mmol/L   Acid-base deficit 2.2 (H) 0.0 - 2.0 mmol/L   O2 Saturation 85.4 %   Patient temperature 98.6    Collection site VENOUS    Drawn by DRAWN BY RN    Sample type VENOUS     Comment: Performed at [facility], [address] [place], [place] [address]  Blood culture (routine x 2)     Status: None (Preliminary result)   Collection Time: 11/20/18  1:18 PM  Result Value Ref Range   Specimen Description      BLOOD RIGHT ANTECUBITAL Performed at [facility], [address] [place], [place] [address]    Special Requests      BOTTLES DRAWN AEROBIC AND ANAEROBIC Blood Culture adequate volume Performed at [facility], [address] [place], [place] [address]    Culture PENDING    Report Status PENDING   I-Stat CG4 Lactic Acid, ED     Status: None   Collection Time: 11/20/18  1:55 PM  Result Value Ref Range   Lactic Acid, Venous 1.53 0.5 - 1.9 mmol/L  Basic metabolic panel     Status:  Abnormal   Collection Time: 11/21/18 12:54 AM  Result Value Ref Range   Sodium 135 135 - 145 mmol/L   Potassium 3.4 (L) 3.5 - 5.1 mmol/L    Comment: DELTA CHECK NOTED SLIGHT HEMOLYSIS    Chloride 110 98 - 111 mmol/L   CO2 15 (L) 22 - 32 mmol/L   Glucose, Bld 145 (H) 70 - 99 mg/dL   BUN 7 6 - 20 mg/dL   Creatinine, Ser 2.95 0.44 - 1.00 mg/dL   Calcium 7.3 (L) 8.9 - 10.3 mg/dL    Comment: DELTA CHECK NOTED   GFR calc non Af Amer >60 >60 mL/min   GFR calc Af Amer >60 >60 mL/min   Anion gap 10 5 - 15    Comment: Performed at [facility], [address], [place], [place] [address]  CBC     Status: Abnormal   Collection Time: 11/21/18  2:54 AM  Result Value Ref Range   WBC 17.9 (H) 4.0 - 10.5 K/uL   RBC 3.52 (L) 3.87 - 5.11 MIL/uL   Hemoglobin 11.1 (L) 12.0 - 15.0 g/dL    Comment: REPEATED TO VERIFY DELTA CHECK NOTED    HCT 33.6 (L) 36.0 - 46.0 %   MCV 95.5 80.0 - 100.0 fL   MCH 31.5 26.0 - 34.0 pg   MCHC 33.0 30.0 - 36.0 g/dL   RDW 86.5 78.4 - 69.6 %   Platelets 316 150 - 400 K/uL   nRBC 0.0 0.0 - 0.2 %    Comment: Performed at [facility], [address] [place], [place] [address]  Glucose, capillary     Status: Abnormal   Collection Time: 11/21/18  9:08 AM  Result Value Ref Range   Glucose-Capillary 107 (H) 70 - 99 mg/dL   Comment 1 Notify RN  Comment 2 Document in Chart   Hemoglobin and hematocrit, blood     Status: Abnormal   Collection Time: 11/21/18 10:50 AM  Result Value Ref Range   Hemoglobin 10.5 (L) 12.0 - 15.0 g/dL   HCT 11.9 (L) 14.7 - 82.9 %    Comment: Performed at [facility], [address] [place], [place] [address]  Basic metabolic panel     Status: Abnormal   Collection Time: 11/21/18 10:50 AM  Result Value Ref Range   Sodium 137 135 - 145 mmol/L   Potassium 3.4 (L) 3.5 - 5.1 mmol/L   Chloride 112 (H) 98 - 111 mmol/L   CO2 17 (L) 22 - 32 mmol/L   Glucose, Bld 125 (H) 70 - 99 mg/dL    BUN 6 6 - 20 mg/dL   Creatinine, Ser 0.86 (L) 0.44 - 1.00 mg/dL   Calcium 7.9 (L) 8.9 - 10.3 mg/dL   GFR calc non Af Amer >60 >60 mL/min   GFR calc Af Amer >60 >60 mL/min   Anion gap 8 5 - 15    Comment: Performed at [facility], [address], [place], [place] [address]    Current Facility-Administered Medications  Medication Dose Route Frequency Provider Last Rate Last Dose  . 0.9 % NaCl with KCl 40 mEq / L  infusion   Intravenous Continuous Dhungel, Nishant, MD   Stopped at 11/20/18 1712  . acetaminophen (TYLENOL) tablet 650 mg  650 mg Oral Q6H PRN Dhungel, Nishant, MD       Or  . acetaminophen (TYLENOL) suppository 650 mg  650 mg Rectal Q6H PRN Dhungel, Nishant, MD      . buprenorphine-naloxone (SUBOXONE) 2-0.5 mg per SL tablet 2 tablet  2 tablet Sublingual PRN Reva Bores, MD      . Melene Muller ON 11/22/2018] buprenorphine-naloxone (SUBOXONE) 8-2 mg per SL tablet 1 tablet  1 tablet Sublingual BID Reva Bores, MD      . pantoprazole (PROTONIX) injection 40 mg  40 mg Intravenous Q12H Dhungel, Nishant, MD   40 mg at 11/20/18 2136    Musculoskeletal: Strength & Muscle Tone: not tested Gait & Station: unable to stand Patient leans: N/A  Psychiatric Specialty Exam: Physical Exam  Review of Systems  Unable to perform ROS: Mental status change    Blood pressure (!) 143/79, pulse (!) 102, temperature 97.8 F (36.6 C), temperature source Axillary, resp. rate (!) 30, height 5\' 3"  (1.6 m), weight 45 kg, SpO2 100 %.Body mass index is 17.57 kg/m.  General Appearance: Casual  Eye Contact:  Poor  Speech:  non-communicative  Volume:  N/A  Mood:  Irritable  Affect:  Constricted  Thought Process:unable to assess  Orientation:  Other:   unable to assess  Thought Content:   unable to assess  Suicidal Thoughts:   unable to assess  Homicidal Thoughts:   unable to assess  Memory:   unable to assess  Judgement:  Other:   unable to assess  Insight:   unable to assess   Psychomotor Activity:  Increased and Restlessness  Concentration:  Concentration: Fair  Recall:  [employer] of Knowledge:   unable to assess  Language:   unable to assess  Akathisia:   unable to assess  Handed:  Unable to assess  AIMS (if indicated):     Assets:  Others:   unable to assess  ADL's: unable to assess  Cognition:  Unable to assess  Sleep:  Treatment Plan Summary: 32 year old female who is [redacted] week pregnant, has  history of substance abuse, positive for Cocaine and Opiate on admission, brought to the hospital from [place] due to altered mental status. Patient apparently became combative and aggressive on admission and exhibiting symptoms of opiates withdrawal.  Recommendations: -1:1 sitter for safety. -Consider Olanzapine 5 mg twice daily as needed for aggression/agitation. -Consider coordinate MAT treatment with OB/GYN.  Disposition: Re-consult psychiatric service when patient is more alert  Thedore Mins, MD 11/21/2018 11:43 AM

## 2018-11-21 NOTE — Consult Note (Signed)
[facility]  Impression: Principal Problem:   Acute metabolic encephalopathy Active Problems:   Mood disorder (HCC)   MDD (major depressive disorder), severe (HCC)   Opioid type dependence, continuous (HCC)   Hypokalemia   Polysubstance abuse (HCC)   Hepatitis C antibody test positive   Hypomagnesemia   Acute narcotic withdrawal (HCC)   Acute upper GI bleed   [redacted] weeks gestation of pregnancy  Recommendations: Again withdrawal in pregnancy can be dangerous to the pregnancy and result in fetal demise and miscarriage. It is not recommended. Begin Suboxone as outlined in note from 11/21/18 or may need a change to methadone with work on getting patient to [facility]. Suboxone is much easier to manage if possible. benzos ok in pregnancy, though not for long term use Due to her incarceration, treatment with MAT is difficult. Awaiting call from them about next steps. RROB to check FHR daily.  Reason for consult: Patient is a 32 y.o. G1P0 female who was admitted with metabolic encephalopathy. Has long term drug use hx with IVDU of heroin, and + cocaine. Taken to jail and found to have defecated on self and somewhat unresponsive. Then noted to have hematemesis in ED with AMS and not felt to be safe for transfer to [facility]. Additionally not started on MAT yesterday, and needed to be restrained and sedated this am.  We are asked to see the patient regarding pregnancy.  Past Medical History:  Diagnosis Date  . Anxiety   . Bipolar 1 disorder (HCC)    bipolar  . Cocaine abuse (HCC)   . Depression   . Drug abuse (HCC)   . Opioid abuse (HCC)   . PTSD (post-traumatic stress disorder)   . Sepsis (HCC)   . Sepsis(995.91)   . Suicidal ideation   . Suicidal ideations   . UTI (urinary tract infection)     Past Surgical History:  Procedure Laterality Date  . APPENDECTOMY      Family History  Problem Relation Age of Onset  . Cancer Mother     Social History    Socioeconomic History  . Marital status: Single    Spouse name: Not on file  . Number of children: Not on file  . Years of education: Not on file  . Highest education level: Not on file  Occupational History  . Not on file  Social Needs  . Financial resource strain: Not on file  . Food insecurity:    Worry: Not on file    Inability: Not on file  . Transportation needs:    Medical: Not on file    Non-medical: Not on file  Tobacco Use  . Smoking status: Current Every Day Smoker    Packs/day: 0.25    Types: Cigarettes  . Smokeless tobacco: Never Used  Substance and Sexual Activity  . Alcohol use: Yes    Alcohol/week: 1.0 standard drinks    Types: 1 Cans of beer per week    Frequency: Never    Comment: occasionally  . Drug use: Yes    Types: Heroin, IV    Comment: before being arrested, today is 11/20/2018  . Sexual activity: Yes    Birth control/protection: Condom  Lifestyle  . Physical activity:    Days per week: Not on file    Minutes per session: Not on file  . Stress: Not on file  Relationships  . Social connections:    Talks on phone: Not on file    Gets  together: Not on file    Attends religious service: Not on file    Active member of club or organization: Not on file    Attends meetings of clubs or organizations: Not on file    Relationship status: Not on file  . Intimate partner violence:    Fear of current or ex partner: Not on file    Emotionally abused: Not on file    Physically abused: Not on file    Forced sexual activity: Not on file  Other Topics Concern  . Not on file  Social History Narrative   ** Merged History Encounter **        . pantoprazole (PROTONIX) IV  40 mg Intravenous Q12H    No Known Allergies  Review of Systems - unable to be obtained as she is not very responsive.  Exam Vitals:   11/21/18 0700 11/21/18 0800  BP: 135/83 (!) 143/79  Pulse: 76 (!) 102  Resp: (!) 27 (!) 30  Temp:  97.8 F (36.6 C)  SpO2: 99% 100%    @SPO2 @  Physical Examination: General appearance - sedated, cachectic Chest - normal effort Heart - normal rate and regular rhythm Abdomen - gravid, non-tender Extremities - no pedal edema noted Skin - pale  Labs:  CBC    Component Value Date/Time   WBC 17.9 (H) 11/21/2018 0254   RBC 3.52 (L) 11/21/2018 0254   HGB 10.5 (L) 11/21/2018 1050   HGB 14.8 04/03/2014 2110   HCT 31.4 (L) 11/21/2018 1050   HCT 38.1 12/20/2017 0334   PLT 316 11/21/2018 0254   PLT 153 04/03/2014 2110   MCV 95.5 11/21/2018 0254   MCV 95 04/03/2014 2110   MCH 31.5 11/21/2018 0254   MCHC 33.0 11/21/2018 0254   RDW 14.6 11/21/2018 0254   RDW 13.1 04/03/2014 2110   LYMPHSABS 1.0 11/20/2018 0916   MONOABS 0.2 11/20/2018 0916   EOSABS 0.0 11/20/2018 0916   BASOSABS 0.0 11/20/2018 0916    CMP     Component Value Date/Time   NA 135 11/21/2018 0054   NA 140 04/03/2014 2110   K 3.4 (L) 11/21/2018 0054   K 4.2 04/03/2014 2110   CL 110 11/21/2018 0054   CL 107 04/03/2014 2110   CO2 15 (L) 11/21/2018 0054   CO2 28 04/03/2014 2110   GLUCOSE 145 (H) 11/21/2018 0054   GLUCOSE 91 04/03/2014 2110   BUN 7 11/21/2018 0054   BUN 8 04/03/2014 2110   CREATININE 0.48 11/21/2018 0054   CREATININE 0.70 04/03/2014 2110   CALCIUM 7.3 (L) 11/21/2018 0054   CALCIUM 9.1 04/03/2014 2110   PROT 9.6 (H) 11/20/2018 0916   ALBUMIN 4.6 11/20/2018 0916   AST 21 11/20/2018 0916   ALT 17 11/20/2018 0916   ALKPHOS 60 11/20/2018 0916   BILITOT 0.6 11/20/2018 0916   GFRNONAA >60 11/21/2018 0054   GFRNONAA >60 04/03/2014 2110   GFRAA >60 11/21/2018 0054   GFRAA >60 04/03/2014 2110    Lab Results  Component Value Date   TSH 0.388 12/20/2017    No components found for: URINALYSIS  Lab Results  Component Value Date   PREGTESTUR NEGATIVE 08/01/2017    Radiological Studies [place] Ob Limited  Result Date: 11/20/2018 CLINICAL DATA:  Pregnancy complicated by maternal drug use. EXAM: LIMITED OBSTETRIC ULTRASOUND  FINDINGS: Number of Fetuses: 1 Heart Rate:  156 bpm Movement: Yes Presentation: Variable/cephalic Placental Location: Posterior Previa: No Amniotic Fluid (Subjective):  Within normal limits. BPD: 5.04 cm 21  w  2 d MATERNAL FINDINGS: Cervix:  Appears closed. Uterus/Adnexae: The uterus is grossly unremarkable. A fluid-filled structure with debris is favored to represent the bladder. IMPRESSION: 1. Single live IUP. 2. Fluid-filled structure with debris is favored to represent the bladder. However, the patient has some sort a catheter and has been using the restroom which suggests that if this is the bladder it is emptying incompletely. Recommend clinical correlation and attention on follow-up. This exam is performed on an emergent basis and does not comprehensively evaluate fetal size, dating, or anatomy; follow-up complete OB US should be considered if further fetal assessment is warranted. Electronically Signed   By: Gerome Sam III M.D   On: 11/20/2018 18:24   Dg Chest Portable 1 View  Result Date: 11/20/2018 CLINICAL DATA:  Nausea, vomiting common diarrhea. EXAM: PORTABLE CHEST 1 VIEW COMPARISON:  None. FINDINGS: The heart size and mediastinal contours are within normal limits. Both lungs are clear. The visualized skeletal structures are unremarkable. IMPRESSION: No active disease. Electronically Signed   By: Gerome Sam III M.D   On: 11/20/2018 13:24    Thank you so much for allowing [place] to participate in the care of this patient.  We will continue to follow with you. Please call the attending OB/GYN physician with questions or concerns at [phone].

## 2018-11-21 NOTE — Progress Notes (Signed)
FHR 150 BPm by doppler. ICU RN states that she has not seen any vaginal bleeding or leaking of fluid. Dr. Erin FullingHarraway-Smith notified.

## 2018-11-21 NOTE — Consult Note (Signed)
Referring Provider: Dr. Rito Ehrlich Primary Care Physician:  Patient, No Pcp Per Primary Gastroenterologist:  UNASSIGNED  Reason for Consultation:  Hematemesis  HPI: Diane Foley is a 32 y.o. female with polysubstance abuse with recent IV heroin and cocaine use transferred from jail yesterday due to being unresponsive. During ER evaluation she began vomiting coffee-grounds and then have red blood vomitus described as the ER PA as about a 1/3 of a cup of black and 2 handfuls of red vomitus. Patient is also reportedly [redacted] weeks pregnant. No further vomiting overnight or this morning. Patient became combative this morning and was spitting at staff and was given Ativan. Unable to get any history from patient who is lying in bed in restraints and somnolent. IV pulled out but had been on Protonix 40 mg IV Q 12 hours. Nurse and Public relations account executive in the room.  Past Medical History:  Diagnosis Date  . Anxiety   . Bipolar 1 disorder (HCC)    bipolar  . Cocaine abuse (HCC)   . Depression   . Drug abuse (HCC)   . Opioid abuse (HCC)   . PTSD (post-traumatic stress disorder)   . Sepsis (HCC)   . Sepsis(995.91)   . Suicidal ideation   . Suicidal ideations   . UTI (urinary tract infection)     Past Surgical History:  Procedure Laterality Date  . APPENDECTOMY      Prior to Admission medications   Medication Sig Start Date End Date Taking? Authorizing Provider  acetaminophen (TYLENOL) 325 MG tablet Take 2 tablets (650 mg total) by mouth every 6 (six) hours as needed for mild pain or fever (or Fever >/= 101). Patient not taking: Reported on 11/20/2018 12/23/17   Zannie Cove, MD  cephALEXin (KEFLEX) 250 MG capsule Take 1 capsule (250 mg total) by mouth every 8 (eight) hours. For 3days Patient not taking: Reported on 11/20/2018 12/23/17   Zannie Cove, MD  cloNIDine (CATAPRES) 0.1 MG tablet Take 1 tablet (0.1 mg total) by mouth daily. For 3days Patient not taking: Reported on 11/20/2018 12/23/17    Zannie Cove, MD    Scheduled Meds: . pantoprazole (PROTONIX) IV  40 mg Intravenous Q12H   Continuous Infusions: . 0.9 % NaCl with KCl 40 mEq / L Stopped (11/20/18 1712)   PRN Meds:.acetaminophen **OR** acetaminophen  Allergies as of 11/20/2018  . (No Known Allergies)    Family History  Problem Relation Age of Onset  . Cancer Mother     Social History   Socioeconomic History  . Marital status: Single    Spouse name: Not on file  . Number of children: Not on file  . Years of education: Not on file  . Highest education level: Not on file  Occupational History  . Not on file  Social Needs  . Financial resource strain: Not on file  . Food insecurity:    Worry: Not on file    Inability: Not on file  . Transportation needs:    Medical: Not on file    Non-medical: Not on file  Tobacco Use  . Smoking status: Current Every Day Smoker    Packs/day: 0.25    Types: Cigarettes  . Smokeless tobacco: Never Used  Substance and Sexual Activity  . Alcohol use: Yes    Alcohol/week: 1.0 standard drinks    Types: 1 Cans of beer per week    Frequency: Never    Comment: occasionally  . Drug use: Yes    Types: Heroin, IV  Comment: before being arrested, today is 11/20/2018  . Sexual activity: Yes    Birth control/protection: Condom  Lifestyle  . Physical activity:    Days per week: Not on file    Minutes per session: Not on file  . Stress: Not on file  Relationships  . Social connections:    Talks on phone: Not on file    Gets together: Not on file    Attends religious service: Not on file    Active member of club or organization: Not on file    Attends meetings of clubs or organizations: Not on file    Relationship status: Not on file  . Intimate partner violence:    Fear of current or ex partner: Not on file    Emotionally abused: Not on file    Physically abused: Not on file    Forced sexual activity: Not on file  Other Topics Concern  . Not on file  Social  History Narrative   ** Merged History Encounter **        Review of Systems: Unable to obtain  Physical Exam: Vital signs: Vitals:   11/21/18 0700 11/21/18 0800  BP: 135/83 (!) 143/79  Pulse: 76 (!) 102  Resp: (!) 27 (!) 30  Temp:  97.8 F (36.6 C)  SpO2: 99% 100%   Last BM Date: 11/20/18 General:   Somnolent, disheveled, thin, no acute distress Head: normocephalic, atraumatic Eyes: anicteric sclera Mouth: dry lips Lungs:  Clear throughout to auscultation.   No wheezes, crackles, or rhonchi. No acute distress. Heart:  Regular rate and rhythm; no murmurs, clicks, rubs,  or gallops. Abdomen: soft, nontender, nondistended, +BS  Rectal:  Deferred Ext: no edema Neuro: somnolent  GI:  Lab Results: Recent Labs    11/20/18 0916 11/21/18 0254 11/21/18 1050  WBC 15.0* 17.9*  --   HGB 16.4* 11.1* 10.5*  HCT 48.7* 33.6* 31.4*  PLT 430* 316  --    BMET Recent Labs    11/20/18 0916 11/21/18 0054  NA 136 135  K 2.9* 3.4*  CL 99 110  CO2 20* 15*  GLUCOSE 135* 145*  BUN 6 7  CREATININE 0.69 0.48  CALCIUM 9.9 7.3*   LFT Recent Labs    11/20/18 0916  PROT 9.6*  ALBUMIN 4.6  AST 21  ALT 17  ALKPHOS 60  BILITOT 0.6   PT/INR No results for input(s): LABPROT, INR in the last 72 hours.   Studies/Results: [place] Ob Limited  Result Date: 11/20/2018 CLINICAL DATA:  Pregnancy complicated by maternal drug use. EXAM: LIMITED OBSTETRIC ULTRASOUND FINDINGS: Number of Fetuses: 1 Heart Rate:  156 bpm Movement: Yes Presentation: Variable/cephalic Placental Location: Posterior Previa: No Amniotic Fluid (Subjective):  Within normal limits. BPD: 5.04 cm 21 w  2 d MATERNAL FINDINGS: Cervix:  Appears closed. Uterus/Adnexae: The uterus is grossly unremarkable. A fluid-filled structure with debris is favored to represent the bladder. IMPRESSION: 1. Single live IUP. 2. Fluid-filled structure with debris is favored to represent the bladder. However, the patient has some sort a catheter and  has been using the restroom which suggests that if this is the bladder it is emptying incompletely. Recommend clinical correlation and attention on follow-up. This exam is performed on an emergent basis and does not comprehensively evaluate fetal size, dating, or anatomy; follow-up complete OB US should be considered if further fetal assessment is warranted. Electronically Signed   By: Gerome Sam III M.D   On: 11/20/2018 18:24   Dg Chest  Portable 1 View  Result Date: 11/20/2018 CLINICAL DATA:  Nausea, vomiting common diarrhea. EXAM: PORTABLE CHEST 1 VIEW COMPARISON:  None. FINDINGS: The heart size and mediastinal contours are within normal limits. Both lungs are clear. The visualized skeletal structures are unremarkable. IMPRESSION: No active disease. Electronically Signed   By: Gerome Samavid  Williams III M.D   On: 11/20/2018 13:24    Impression/Plan: Coffee grounds and red blood emesis on admit in the setting of drug abuse with withdrawal. Hgb 11.1 (16.4 on admit) but likely dehydrated causing the elevated Hgb on admit. Suspect hematemesis is due to erosive esophagitis vs Mallory Weiss tear and would manage conservatively in light of her drug withdrawal and pregnancy. Doubt peptic ulcer bleed. No further signs of bleeding. Resume Protonix 40 mg IV Q 12 hours when IV access re-established. NPO until mental status clears. If she begins vomiting up blood again with change in hemodynamics then will consider EGD with OB permission but patient will likely need to be intubated prior to EGD for airway protection. Will sign off. Call if questions. [facility] available to see again if needed.     LOS: 1 day   Shirley FriarVincent C Kasper Mudrick  11/21/2018, 11:24 AM  Questions please call [phone]

## 2018-11-22 ENCOUNTER — Encounter (HOSPITAL_COMMUNITY): Payer: Self-pay | Admitting: Obstetrics and Gynecology

## 2018-11-22 LAB — CBC
HCT: 32 % — ABNORMAL LOW (ref 36.0–46.0)
Hemoglobin: 10.3 g/dL — ABNORMAL LOW (ref 12.0–15.0)
MCH: 30.8 pg (ref 26.0–34.0)
MCHC: 32.2 g/dL (ref 30.0–36.0)
MCV: 95.8 fL (ref 80.0–100.0)
NRBC: 0 % (ref 0.0–0.2)
Platelets: 200 10*3/uL (ref 150–400)
RBC: 3.34 MIL/uL — ABNORMAL LOW (ref 3.87–5.11)
RDW: 14.3 % (ref 11.5–15.5)
WBC: 10.4 10*3/uL (ref 4.0–10.5)

## 2018-11-22 LAB — BASIC METABOLIC PANEL
ANION GAP: 10 (ref 5–15)
BUN: 5 mg/dL — ABNORMAL LOW (ref 6–20)
CO2: 16 mmol/L — ABNORMAL LOW (ref 22–32)
Calcium: 7.9 mg/dL — ABNORMAL LOW (ref 8.9–10.3)
Chloride: 111 mmol/L (ref 98–111)
Creatinine, Ser: 0.42 mg/dL — ABNORMAL LOW (ref 0.44–1.00)
GFR calc Af Amer: >60 mL/min (ref >60)
GFR calc non Af Amer: >60 mL/min (ref >60)
Glucose, Bld: 102 mg/dL — ABNORMAL HIGH (ref 70–99)
Potassium: 4.1 mmol/L (ref 3.5–5.1)
Sodium: 137 mmol/L (ref 135–145)

## 2018-11-22 LAB — HEPATITIS B SURFACE ANTIGEN: Hepatitis B Surface Ag: NEGATIVE

## 2018-11-22 LAB — GLUCOSE, CAPILLARY: GLUCOSE-CAPILLARY: 110 mg/dL — AB (ref 70–99)

## 2018-11-22 LAB — HEPATITIS C ANTIBODY (REFLEX): HCV Ab: >11 s/co ratio — ABNORMAL HIGH (ref 0.0–0.9)

## 2018-11-22 LAB — COMMENT2 - HEP PANEL

## 2018-11-22 MED ORDER — METHADONE HCL 10 MG PO TABS: 10.0000 mg | ORAL_TABLET | Freq: Every day | ORAL | 0 refills | Status: DC

## 2018-11-22 MED ORDER — PANTOPRAZOLE SODIUM 40 MG PO TBEC: 40.0000 mg | DELAYED_RELEASE_TABLET | Freq: Two times a day (BID) | ORAL | 0 refills | Status: DC

## 2018-11-22 MED ORDER — PANTOPRAZOLE SODIUM 40 MG PO TBEC
40.0000 mg | DELAYED_RELEASE_TABLET | Freq: Two times a day (BID) | ORAL | Status: DC
  Administered 2018-11-22: 40 mg via ORAL
  Filled 2018-11-22: qty 1

## 2018-11-22 NOTE — Discharge Summary (Addendum)
[facility]  Physician Discharge Summary   Patient ID: Diane Foley MRN: [id number] DOB/AGE: [birth year] 32 y.o.  Admit date: 11/20/2018 Discharge date: 11/22/2018  PCP: Patient, No Pcp Per  DISCHARGE DIAGNOSES:  Acute opioid withdrawal, improved She will be seen at [facility] where she will start getting her methadone from tomorrow OB/GYN to arrange outpatient follow-up   RECOMMENDATIONS FOR OUTPATIENT FOLLOW UP: 1. Patient to go to [facility] at 6 AM tomorrow to get her next dose of methadone 2. OB/GYN to arrange outpatient follow-up for her OB care   DISCHARGE CONDITION: fair  Diet recommendation: Regular as tolerated  Filed Weights   11/20/18 0831  Weight: 45 kg    INITIAL HISTORY: 32 y.o.female,with polysubstance abuse (heroin and cocaine), hep C antibody positive, PTSD, bipolar disorder, [redacted] weeks pregnant who is currently in custody at [facility] for violating parole was brought to the ED from jail unresponsive. Patient reported using IV heroin the day prior to admission along with smoking crack cocaine.  On the morning of admission she was found to be drowsy and incontinent of stool this suspected that she may be withdrawing from heroin and was brought to the ED. After discussions with the OB/GYN patient was to be transferred to [facility] to be considered for MAT in a monitored setting.  However she started having coffee-ground emesis and so she was hospitalized here at [facility] long.   Reason for Visit: Opioid withdrawal  Consultants: OB/GYN.  Psychiatry.  Gastroenterology  Procedures: None   HOSPITAL COURSE:   Acute metabolic encephalopathy-aggressive behavior Unclear if yesterday's aggressive behavior was due to encephalopathy or just behavioral issue.  She was given Ativan with improvement in her symptoms.  She was seen by OB/GYN who started her on Suboxone and subsequently changed her to  methadone.  Agitation has resolved.  Patient seems to be calmer this morning.  Seen by psychiatry as well and we appreciate their assistance.  Olanzapine recommended but only as needed for agitation.  Since her agitation has resolved there is no need for this medication.  Concern for opioid withdrawal in a pregnant state/opioid use disorder Patient seen by OB/GYN.  They recommended placing the patient on Suboxone or methadone to prevent fetal harm from opioid withdrawal.  Initially started on Suboxone but then switched over to methadone yesterday evening.  Discussed with Dr. Vergie Living with OB/GYN this morning.    He has contacted the [place] [facility] [place].  Apparently patient was supposed to be seen there at 6:00 this morning but she was in the hospital.  He has made arrangements for the patient to be seen tomorrow at 6 AM for her to get her methadone dose.  Last dose of methadone 10 mg was administered today, 11/22/2018, at 9:41 AM.  Coffee-ground emesis Patient was noted to have coffee-ground emesis when she was about to be transported to [facility].  No overt hematemesis was noted.  Some drop in hemoglobin noted which is dilutional.  Seen by gastroenterology.  Tolerating her liquid diet.  Continue PPI but change to oral.  No indication for endoscopy.  Quite possible she may have had a small Mallory-Weiss tear which has since resolved.  Advance to solid food.   Hypokalemia and hypomagnesemia These were repleted.    Normal anion gap metabolic acidosis Most likely due to her acute illness and vomiting.  Bicarbonate is stable.  Oral hydration.  Leukocytosis She is afebrile.  No obvious source of infection.  This is  most likely reactive.  Chest x-ray did not show any infection.  WBC is normal this morning.  UA was ordered but not done.  However patient without any symptoms suggestive of UTI.  [redacted] weeks gestation Seen by OB/GYN.  No concern about fetal health currently.  They will  arrange outpatient follow-up.  Mood disorder/major depressive disorder This has improved.  Could have been due to the withdrawal process.  Positive hepatitis C antibody HIV nonreactive.  Normocytic anemia Drop in hemoglobin dilutional.  No active bleeding noted.  Overall stable.  Patient seems to be doing well on methadone.  Okay for discharge from the hospital today.     PERTINENT LABS:  The results of significant diagnostics from this hospitalization (including imaging, microbiology, ancillary and laboratory) are listed below for reference.    Microbiology: Recent Results (from the past 240 hour(s))  Blood culture (routine x 2)     Status: None (Preliminary result)   Collection Time: 11/20/18  1:13 PM  Result Value Ref Range Status   Specimen Description   Final    BLOOD RIGHT FOREARM Performed at [facility], [address] [place], [place] [address]    Special Requests   Final    BOTTLES DRAWN AEROBIC AND ANAEROBIC Blood Culture results may not be optimal due to an inadequate volume of blood received in culture bottles Performed at [facility], [address] [place], [place] [address]    Culture   Final    NO GROWTH 2 DAYS Performed at [facility], [address] [place], [place] [address]    Report Status PENDING  Incomplete  Blood culture (routine x 2)     Status: None (Preliminary result)   Collection Time: 11/20/18  1:18 PM  Result Value Ref Range Status   Specimen Description   Final    BLOOD RIGHT ANTECUBITAL Performed at [facility], [address] [place], [place] [address]    Special Requests   Final    BOTTLES DRAWN AEROBIC AND ANAEROBIC Blood Culture adequate volume Performed at [facility], [address] [place], [place] [address]    Culture   Final    NO GROWTH 2 DAYS Performed at [facility], [address] [place], [place] [address]    Report Status  PENDING  Incomplete  MRSA PCR Screening     Status: None   Collection Time: 11/21/18  7:44 PM  Result Value Ref Range Status   MRSA by PCR NEGATIVE NEGATIVE Final    Comment:        The GeneXpert MRSA Assay (FDA approved for NASAL specimens only), is one component of a comprehensive MRSA colonization surveillance program. It is not intended to diagnose MRSA infection nor to guide or monitor treatment for MRSA infections. Performed at [facility], [address] [place], [place] [address]      Labs: Basic Metabolic Panel: Recent Labs  Lab 11/20/18 0916 11/21/18 0054 11/21/18 1050 11/22/18 0334  NA 136 135 137 137  K 2.9* 3.4* 3.4* 4.1  CL 99 110 112* 111  CO2 20* 15* 17* 16*  GLUCOSE 135* 145* 125* 102*  BUN 6 7 6  5*  CREATININE 0.69 0.48 0.40* 0.42*  CALCIUM 9.9 7.3* 7.9* 7.9*   Liver Function Tests: Recent Labs  Lab 11/20/18 0916  AST 21  ALT 17  ALKPHOS 60  BILITOT 0.6  PROT 9.6*  ALBUMIN 4.6   CBC:  Recent Labs  Lab 11/20/18 0916 11/21/18 0254 11/21/18 1050 11/22/18 0334  WBC 15.0* 17.9*  --  10.4  NEUTROABS 13.6*  --   --   --   HGB 16.4* 11.1* 10.5* 10.3*  HCT 48.7* 33.6* 31.4* 32.0*  MCV 90.9 95.5  --  95.8  PLT 430* 316  --  200    CBG: Recent Labs  Lab 11/21/18 0908 11/22/18 0755  GLUCAP 107* 110*     IMAGING STUDIES US Ob Limited  Result Date: 11/20/2018 CLINICAL DATA:  Pregnancy complicated by maternal drug use. EXAM: LIMITED OBSTETRIC ULTRASOUND FINDINGS: Number of Fetuses: 1 Heart Rate:  156 bpm Movement: Yes Presentation: Variable/cephalic Placental Location: Posterior Previa: No Amniotic Fluid (Subjective):  Within normal limits. BPD: 5.04 cm 21 w  2 d MATERNAL FINDINGS: Cervix:  Appears closed. Uterus/Adnexae: The uterus is grossly unremarkable. A fluid-filled structure with debris is favored to represent the bladder. IMPRESSION: 1. Single live IUP. 2. Fluid-filled structure with debris is favored to  represent the bladder. However, the patient has some sort a catheter and has been using the restroom which suggests that if this is the bladder it is emptying incompletely. Recommend clinical correlation and attention on follow-up. This exam is performed on an emergent basis and does not comprehensively evaluate fetal size, dating, or anatomy; follow-up complete OB US should be considered if further fetal assessment is warranted. Electronically Signed   By: Gerome Sam III M.D   On: 11/20/2018 18:24   Dg Chest Portable 1 View  Result Date: 11/20/2018 CLINICAL DATA:  Nausea, vomiting common diarrhea. EXAM: PORTABLE CHEST 1 VIEW COMPARISON:  None. FINDINGS: The heart size and mediastinal contours are within normal limits. Both lungs are clear. The visualized skeletal structures are unremarkable. IMPRESSION: No active disease. Electronically Signed   By: Gerome Sam III M.D   On: 11/20/2018 13:24    DISCHARGE EXAMINATION: Vitals:   11/22/18 0200 11/22/18 0300 11/22/18 0700 11/22/18 0800  BP: 139/81 124/77 139/76 128/66  Pulse: 74     Resp: (!) 23 (!) 26  13  Temp:    98.7 F (37.1 C)  TempSrc:    Oral  SpO2: 99%     Weight:      Height:       General appearance: alert, cooperative, appears stated age and no distress Head: Normocephalic, without obvious abnormality, atraumatic Resp: clear to auscultation bilaterally Cardio: regular rate and rhythm, S1, S2 normal, no murmur, click, rub or gallop   DISPOSITION: Back to study of the [facility] department  Discharge Instructions    Call MD for:  difficulty breathing, headache or visual disturbances   Complete by:  As directed    Call MD for:  extreme fatigue   Complete by:  As directed    Call MD for:  persistant dizziness or light-headedness   Complete by:  As directed    Call MD for:  persistant nausea and vomiting   Complete by:  As directed    Call MD for:  severe uncontrolled pain   Complete by:  As directed     Call MD for:  temperature >100.4   Complete by:  As directed    Diet general   Complete by:  As directed    Discharge instructions   Complete by:  As directed    Patient to go to [facility] at 6 AM tomorrow (11/23/2018) to get her dose of methadone.  You were cared for by a hospitalist during  your hospital stay. If you have any questions about your discharge medications or the care you received while you were in the hospital after you are discharged, you can call the unit and asked to speak with the hospitalist on call if the hospitalist that took care of you is not available. Once you are discharged, your primary care physician will handle any further medical issues. Please note that NO REFILLS for any discharge medications will be authorized once you are discharged, as it is imperative that you return to your primary care physician (or establish a relationship with a primary care physician if you do not have one) for your aftercare needs so that they can reassess your need for medications and monitor your lab values. If you do not have a primary care physician, you can call [phone] for a physician referral.   Increase activity slowly   Complete by:  As directed         Allergies as of 11/22/2018   No Known Allergies     Medication List    STOP taking these medications   acetaminophen 325 MG tablet Commonly known as:  TYLENOL   cephALEXin 250 MG capsule Commonly known as:  KEFLEX   cloNIDine 0.1 MG tablet Commonly known as:  CATAPRES     TAKE these medications   methadone 10 MG tablet Commonly known as:  DOLOPHINE Take 1 tablet (10 mg total) by mouth daily. Start taking on:  November 23, 2018   pantoprazole 40 MG tablet Commonly known as:  PROTONIX Take 1 tablet (40 mg total) by mouth 2 (two) times daily with a meal.          TOTAL DISCHARGE TIME: 35 mins  [employer]  [facility] Pager [phone]  11/22/2018, 11:50 AM

## 2018-11-22 NOTE — Progress Notes (Signed)
OB Note I called [facility] and they stated that [name] has a program with the [name]. I called them at [phone] and they said they were actually expecting her for an appointment today at 0600 (their doctors are their from 0500-0700 and they stop dosing at 1000). They said that when she is discharged today that: -they need a discharge summary with mention of her last dose of methadone sent to [phone] ATTN: [place] [place] -have the sheriff's dept take her there tomorrow 12/17 @ 0600  I d/w Dr. Rito EhrlichKrishnan and we will set her up for a visit [facility]  Diane Copaharlie Kenzlei Runions, Jr MD Attending Center for [facility] (Faculty Practice) 11/22/2018 Time: 1144am

## 2018-11-22 NOTE — Progress Notes (Signed)
Daily Antepartum Note  Admission Date: 11/20/2018 Current Date: 11/22/2018 11:06 AM  Diane Foley is a 32 y.o. G1 @ [redacted]w[redacted]d by 21wk u/s, HD#3, admitted for opiate withdrawal, coffee ground emesis.  Pregnancy complicated by: Patient Active Problem List   Diagnosis Date Noted  . [redacted] weeks gestation of pregnancy 11/21/2018  . Acute metabolic encephalopathy 11/20/2018  . Hypomagnesemia 11/20/2018  . Acute narcotic withdrawal (HCC) 11/20/2018  . Acute upper GI bleed 11/20/2018  . Hepatitis C antibody test positive 12/23/2017  . Polysubstance abuse (HCC) 12/20/2017  . Acute encephalopathy 12/20/2017  . Cocaine overdose, accidental or unintentional, initial encounter (HCC) 07/31/2017  . Hypokalemia 07/31/2017  . Normochromic normocytic anemia 07/31/2017  . Opioid type dependence, continuous (HCC) 02/16/2015  . MDD (major depressive disorder), severe (HCC) 02/11/2015  . Major depressive disorder without psychotic features 02/09/2015  . Mood disorder (HCC) 02/09/2015  . UTI (urinary tract infection)     Overnight/24hr events:  Patient started on methadone yesterday by Dr. Erin Fulling (OB)  Subjective:  No s/s of PTL, ?fetal quickening  Objective:    Current Vital Signs 24h Vital Sign Ranges  T 98.7 F (37.1 C) Temp  Avg: 98.1 F (36.7 C)  Min: 97.4 F (36.3 C)  Max: 98.7 F (37.1 C)  BP 128/66 BP  Min: 124/77  Max: 152/97  HR 74 Pulse  Avg: 78.8  Min: 72  Max: 90  RR 13 Resp  Avg: 28.1  Min: 13  Max: 39  SaO2 99 % Room Air SpO2  Avg: 98.8 %  Min: 97 %  Max: 100 %       24 Hour I/O Current Shift I/O  Time Ins Outs 12/15 0701 - 12/16 0700 In: 506.1 [I.V.:414.3] Out: -  12/16 0701 - 12/16 1900 In: 130 [P.O.:120; I.V.:10] Out: 1000 [Urine:1000]   Patient Vitals for the past 24 hrs:  BP Temp Temp src Pulse Resp SpO2  11/22/18 0800 128/66 98.7 F (37.1 C) Oral - 13 -  11/22/18 0700 139/76 - - - - -  11/22/18 0300 124/77 - - - (!) 26 -  11/22/18 0200 139/81 - - 74 (!) 23  99 %  11/22/18 0100 134/89 - - 77 (!) 21 98 %  11/21/18 2335 - (!) 97.4 F (36.3 C) Axillary - - -  11/21/18 2300 (!) 139/92 - - 75 (!) 26 99 %  11/21/18 2200 134/82 - - 90 (!) 24 100 %  11/21/18 2100 135/71 - - 72 (!) 26 100 %  11/21/18 2000 (!) 142/84 - - 75 (!) 26 97 %  11/21/18 1936 - 97.8 F (36.6 C) Axillary - - -  11/21/18 1800 (!) 142/86 - - 80 (!) 29 98 %  11/21/18 1700 131/82 - - 82 (!) 33 99 %  11/21/18 1640 - 98.4 F (36.9 C) Oral - - -  11/21/18 1600 (!) 143/83 - - 84 (!) 34 99 %  11/21/18 1500 (!) 142/80 - - - (!) 34 -  11/21/18 1400 (!) 146/73 - - - (!) 31 -  11/21/18 1300 (!) 152/97 - - - (!) 39 -  11/21/18 1245 - 98.4 F (36.9 C) Axillary - - -  11/21/18 1200 (!) 142/80 - - - (!) 37 -   Fetal heart tones: pending  Physical exam: General: Well nourished, well developed female in no acute distress. Abdomen: nttp Neuro: normal  Medications: Current Facility-Administered Medications  Medication Dose Route Frequency Provider Last Rate Last Dose  . 0.9 % NaCl  with KCl 40 mEq / L  infusion   Intravenous Continuous Osvaldo ShipperKrishnan, Gokul, MD 10 mL/hr at 11/22/18 0855 10 mL/hr at 11/22/18 0855  . acetaminophen (TYLENOL) tablet 650 mg  650 mg Oral Q6H PRN Dhungel, Nishant, MD       Or  . acetaminophen (TYLENOL) suppository 650 mg  650 mg Rectal Q6H PRN Dhungel, Nishant, MD      . LORazepam (ATIVAN) injection 0.5 mg  0.5 mg Intravenous Q8H PRN Osvaldo ShipperKrishnan, Gokul, MD      . methadone (DOLOPHINE) tablet 10 mg  10 mg Oral Daily Willodean RosenthalHarraway-Smith, Carolyn, MD   10 mg at 11/22/18 0941  . methadone (DOLOPHINE) tablet 10 mg  10 mg Oral Daily PRN Willodean RosenthalHarraway-Smith, Carolyn, MD   10 mg at 11/21/18 2357  . OLANZapine zydis (ZYPREXA) disintegrating tablet 5 mg  5 mg Oral Q12H PRN Akintayo, Mojeed, MD      . pantoprazole (PROTONIX) EC tablet 40 mg  40 mg Oral BID WC Osvaldo ShipperKrishnan, Gokul, MD   40 mg at 11/22/18 0942  . sodium chloride flush (NS) 0.9 % injection 10-40 mL  10-40 mL Intracatheter Q12H  Osvaldo ShipperKrishnan, Gokul, MD   10 mL at 11/22/18 0941  . sodium chloride flush (NS) 0.9 % injection 10-40 mL  10-40 mL Intracatheter PRN Osvaldo ShipperKrishnan, Gokul, MD        Labs:  Recent Labs  Lab 11/20/18 [id number] 11/21/18 0254 11/21/18 1050 11/22/18 0334  WBC 15.0* 17.9*  --  10.4  HGB 16.4* 11.1* 10.5* 10.3*  HCT 48.7* 33.6* 31.4* 32.0*  PLT 430* 316  --  200    Recent Labs  Lab 11/20/18 0916 11/21/18 0054 11/21/18 1050 11/22/18 0334  NA 136 135 137 137  K 2.9* 3.4* 3.4* 4.1  CL 99 110 112* 111  CO2 20* 15* 17* 16*  BUN 6 7 6  5*  CREATININE 0.69 0.48 0.40* 0.42*  CALCIUM 9.9 7.3* 7.9* 7.9*  PROT 9.6*  --   --   --   BILITOT 0.6  --   --   --   ALKPHOS 60  --   --   --   ALT 17  --   --   --   AST 21  --   --   --   GLUCOSE 135* 145* 125* 102*     Radiology: no new imaging  Assessment & Plan:  Pt stable *Pregnancy: reviewed history with her. She is a G1, unknown LMP. Patient knew she was pregnant but only recently found out. Not on any prescriptions meds and using heroin and cocaine and tobacco only. [facility]. D/w pt that best for her pregnancy is to not go through withdrawal b/c it can cause preterm labor, abruption. I d/w her primary team (Dr. Rito EhrlichKrishnan) and he states that she is over the acute period. She is currently incarcerated until a court date in February unless she can be bailed out which she states is a decent possibility; patient states she [place] in [place].  Dr. Rito EhrlichKrishnan states that there is a risk she could withdraw if she is discharged without any medications but states that since she is on methadone that her risk should be minimal but he states that he cannot prescribe that. I d/w with the patient and she is committed to trying to stay clean, and I also d/w her risk of illicit drug use on the pregnancy and the safety of methadone/subutex/subuxone. I discussed her case with the SW and  we will try and work on outpatient treatment options that the  jail can take her to.  Dr. Rito Ehrlich states that they are ready for discharge, but I told him that outpatient treatment set up is needed prior to discharge to prevent her from withdrawing. If cannot get it set up today then I may need to [facility]  -continue daily FHTs while inpatient -will need new OB visit set up in our clinic on eventual discharge.   Cornelia Copa MD Attending Center for [facility])

## 2018-11-22 NOTE — Discharge Instructions (Signed)
Opioid Use Disorder Opioids are powerful substances that relieve pain. Opioids include illegal drugs, such as heroin, as well as prescription pain medicines, such as codeine, morphine, hydrocodone, oxycodone, and fentanyl. Opioid use disorder is when you take opioids for nonmedical reasons even though taking them hurts your health and well-being. Taking prescribed opioids regularly can lead to dependence, especially if you take them in larger amounts or more often than they should be taken. Opioid use disorder often disrupts life at home, work, or school. It can cause mental and physical problems. It also increases your risk of suicide and death from overdose. What are the causes? This condition is caused by taking opioids. Taking opioids repeatedly results in changes in the brain that make it hard to control opioid use. Many people develop this condition because they like the way they feel when they take opioids or because they get addicted to them. What increases the risk? This condition is more likely to develop in:  People with a family history of opioid use disorder.  People who misuse other drugs.  People with a mental illness, such as depression, post-traumatic stress disorder, or antisocial personality disorder.  People who begin use at an early age, such as during their teen years.  What are the signs or symptoms? Symptoms of this condition include:  Taking greater amounts of an opioid than you want to or for longer than you want to.  Trying several times to control your opioid use.  Spending a lot of time getting opioids, using them, or recovering from their effects.  Craving opioids.  Having problems at work, at school, at home, or in a relationship because of opioid use.  Giving up or cutting down on important life activities because of opioid use.  Using opioids when it is dangerous, such as when driving a car.  Continuing to use an opioid even though it has led to a  physical problem, such as: ? Severe constipation. ? Poor nutrition. ? Infertility. ? Tuberculosis. ? Aspiration pneumonia. ? An infection, such as hepatitis or HIV (human immunodeficiency virus).  Continuing to use an opioid even though it is causing a mental problem, such as: ? Depression or anxiety. ? Hallucinations. ? Sleep problems. ? Loss of interest in sex.  Needing more and more of an opioid to get the same effect that you want from the opioid (building up a tolerance).  Having symptoms of withdrawal when you stop using an opioid. Some symptoms of withdrawal are: ? Depression or anxiety. ? Irritability. ? Nausea or vomiting. ? Muscle aches or spasms. ? Watery eyes. ? Trouble sleeping. ? Yawning.  How is this diagnosed? This condition is diagnosed with an assessment. During the assessment, your health care provider will ask about your opioid use and how it affects your life. Your health care provider may perform a physical exam or do lab tests to see if you have physical problems resulting from opioid use. Your health care provider may also screen for drug use and refer you to a mental health professional for evaluation. How is this treated? Treatment for this condition is usually provided by mental health professionals with training in substance use disorders. The first step in treatment is detoxification, which involves taking medicines to lessen withdrawal symptoms. Additional treatment may involve:  Counseling. This treatment is also called talk therapy. It is provided by substance use treatment counselors. A counselor can address the reasons you use opioids and suggest ways to keep you from using opioids   again. The goals of talk therapy are to: ? Find healthy activities and ways to cope with stress. ? Identify and avoid what triggers your opioid use. ? Help you learn how to handle cravings.  Support groups. Support groups are run by people who have quit using opioids.  They provide emotional support, advice, and guidance.  A medicine that blocks opioid receptors in your brain. This medicine can reduce opioid cravings that lead to relapse. This medicine also blocks the good feeling that you get from using opioids.  Opioid maintenance treatment. This involves taking certain kinds of opioid medicines. These medicines satisfy cravings but are safer than commonly misused opioids. This is often the best option for people who continue to relapse with other treatments.  Follow these instructions at home:  Take over-the-counter and prescription medicines only as told by your health care provider.  Check with your health care provider before starting any new medicines.  Keep all follow-up visits as told by your health care provider. This is important. Where to find more information:  National Institute on Drug Abuse: www.drugabuse.gov  Substance Abuse and Mental Health Services Administration: www.samhsa.gov Contact a health care provider if:  You are not able to take your medicines as told.  Your symptoms get worse. Get help right away if:  You may have taken too much of an opioid (overdosed).  You have serious thoughts about hurting yourself or others. If you ever feel like you may hurt yourself or others, or have thoughts about taking your own life, get help right away. You can go to your nearest emergency department or call:  Your local emergency services (911 in the [place]  A suicide crisis helpline, such as the National Suicide Prevention Lifeline at 1-800-273-8255. This is open 24 hours a day.  This information is not intended to replace advice given to you by your health care provider. Make sure you discuss any questions you have with your health care provider. Document Released: 09/21/2007 Document Revised: 09/05/2016 Document Reviewed: 09/05/2016 Elsevier Interactive Patient Education  2018 Elsevier Inc.  

## 2018-11-25 ENCOUNTER — Other Ambulatory Visit: Payer: Self-pay | Admitting: *Deleted

## 2018-11-25 DIAGNOSIS — F1120 Opioid dependence, uncomplicated: Secondary | ICD-10-CM

## 2018-11-25 DIAGNOSIS — R768 Other specified abnormal immunological findings in serum: Secondary | ICD-10-CM

## 2018-11-25 DIAGNOSIS — Z3A21 21 weeks gestation of pregnancy: Secondary | ICD-10-CM

## 2018-11-25 DIAGNOSIS — F1910 Other psychoactive substance abuse, uncomplicated: Secondary | ICD-10-CM

## 2018-11-25 DIAGNOSIS — G9340 Encephalopathy, unspecified: Secondary | ICD-10-CM

## 2018-11-25 LAB — CULTURE, BLOOD (ROUTINE X 2)
Culture: NO GROWTH
Culture: NO GROWTH
Special Requests: ADEQUATE

## 2018-11-25 NOTE — Progress Notes (Signed)
Per orders scheduled for US.

## 2018-11-29 ENCOUNTER — Telehealth: Payer: Self-pay | Admitting: Family Medicine

## 2018-11-29 NOTE — Telephone Encounter (Signed)
[facility] Deon. She stated Transportation is not available to bring her to the appointment on Jan. 8, 2019. Rescheduled for Jan 6. Transferred to MFM to reschedule the ultrasound.

## 2018-12-08 NOTE — L&D Delivery Note (Signed)
  Delivery Note After about an hour of pushing, at 9:57 AM a viable female was delivered via Vaginal, Spontaneous (Presentation: ROA).  APGAR: 9, 9; weight pending. After 1 minute, the cord was clamped and cut. 40 units of pitocin diluted in 1000cc LR was infused rapidly IV.  The placenta separated spontaneously and delivered via CCT and maternal pushing effort.  It was inspected and appears to be intact with a 3 VC.   Marland Kitchen     Anesthesia:  epidural Episiotomy: None Lacerations: None Suture Repair:  Est. Blood Loss (mL): 95  Mom to postpartum.  Baby to Couplet care / Skin to Skin.  Diane Foley 04/02/2019, 10:49 AM

## 2018-12-13 ENCOUNTER — Ambulatory Visit (INDEPENDENT_AMBULATORY_CARE_PROVIDER_SITE_OTHER): Admitting: Obstetrics & Gynecology

## 2018-12-13 ENCOUNTER — Encounter: Payer: Self-pay | Admitting: Obstetrics & Gynecology

## 2018-12-13 ENCOUNTER — Other Ambulatory Visit (HOSPITAL_COMMUNITY)
Admission: RE | Admit: 2018-12-13 | Discharge: 2018-12-13 | Disposition: A | Discharge status: Home / Self Care | Source: Ambulatory Visit | Attending: Obstetrics & Gynecology | Admitting: Obstetrics & Gynecology

## 2018-12-13 ENCOUNTER — Encounter (HOSPITAL_COMMUNITY): Payer: Self-pay

## 2018-12-13 ENCOUNTER — Ambulatory Visit (HOSPITAL_COMMUNITY)
Admission: RE | Admit: 2018-12-13 | Discharge: 2018-12-13 | Disposition: A | Discharge status: Home / Self Care | Source: Ambulatory Visit | Attending: Obstetrics and Gynecology | Admitting: Obstetrics and Gynecology

## 2018-12-13 VITALS — BP 100/62 | HR 86 | Wt 123.0 lb

## 2018-12-13 DIAGNOSIS — O99323 Drug use complicating pregnancy, third trimester: Secondary | ICD-10-CM

## 2018-12-13 DIAGNOSIS — O0932 Supervision of pregnancy with insufficient antenatal care, second trimester: Secondary | ICD-10-CM

## 2018-12-13 DIAGNOSIS — Z141 Cystic fibrosis carrier: Secondary | ICD-10-CM

## 2018-12-13 DIAGNOSIS — O0990 Supervision of high risk pregnancy, unspecified, unspecified trimester: Secondary | ICD-10-CM | POA: Insufficient documentation

## 2018-12-13 DIAGNOSIS — O98413 Viral hepatitis complicating pregnancy, third trimester: Secondary | ICD-10-CM | POA: Diagnosis not present

## 2018-12-13 DIAGNOSIS — Z3A24 24 weeks gestation of pregnancy: Secondary | ICD-10-CM

## 2018-12-13 DIAGNOSIS — F1120 Opioid dependence, uncomplicated: Secondary | ICD-10-CM

## 2018-12-13 DIAGNOSIS — Z3A21 21 weeks gestation of pregnancy: Secondary | ICD-10-CM | POA: Diagnosis present

## 2018-12-13 DIAGNOSIS — G9340 Encephalopathy, unspecified: Secondary | ICD-10-CM

## 2018-12-13 DIAGNOSIS — F1910 Other psychoactive substance abuse, uncomplicated: Secondary | ICD-10-CM | POA: Insufficient documentation

## 2018-12-13 DIAGNOSIS — R768 Other specified abnormal immunological findings in serum: Secondary | ICD-10-CM | POA: Diagnosis present

## 2018-12-13 DIAGNOSIS — O09899 Supervision of other high risk pregnancies, unspecified trimester: Secondary | ICD-10-CM

## 2018-12-13 DIAGNOSIS — Z363 Encounter for antenatal screening for malformations: Secondary | ICD-10-CM | POA: Diagnosis not present

## 2018-12-13 DIAGNOSIS — O0992 Supervision of high risk pregnancy, unspecified, second trimester: Secondary | ICD-10-CM

## 2018-12-13 LAB — POCT URINALYSIS DIP (DEVICE)
Glucose, UA: NEGATIVE mg/dL
Ketones, ur: NEGATIVE mg/dL
Nitrite: NEGATIVE
Protein, ur: NEGATIVE mg/dL
SPECIFIC GRAVITY, URINE: 1.02 (ref 1.005–1.030)
Urobilinogen, UA: 4 mg/dL — ABNORMAL HIGH (ref 0.0–1.0)
pH: 6 (ref 5.0–8.0)

## 2018-12-13 MED ORDER — METHADONE HCL 10 MG PO TABS: 70.0000 mg | ORAL_TABLET | Freq: Every day | ORAL | 0 refills | Status: DC

## 2018-12-13 NOTE — Progress Notes (Signed)
  Subjective:pt is incarcerated, opioid dependant. H/o cocaine use    Diane Foley is being seen today for her first obstetrical visit.  This is not a planned pregnancy. She is at [redacted]w[redacted]d gestation. Her obstetrical history is significant for opioids and cocaine, psychiatric illness, incarceration. Relationship with FOB: significant other, not living together. Patient does intend to breast feed. Pregnancy history fully reviewed.  Patient reports no complaints.  Review of Systems:   Review of Systems  Constitutional: Negative.   Gastrointestinal: Negative.   Genitourinary: Negative.     Objective:     BP 100/62   Pulse 86   Wt 123 lb (55.8 kg)   BMI 21.79 kg/m  Physical Exam  Vitals reviewed. Constitutional: She is oriented to person, place, and time. She appears well-developed. No distress.  Cardiovascular: Normal rate, regular rhythm and normal heart sounds.  Respiratory: Effort normal and breath sounds normal.  GI: There is no abdominal tenderness.  gravid  Genitourinary:    Vaginal discharge present.     Genitourinary Comments: Cervix closed, pap done   Musculoskeletal: Normal range of motion.  Neurological: She is alert and oriented to person, place, and time.  Skin: Skin is warm and dry.  Psychiatric: She has a normal mood and affect. Her behavior is normal.    Maternal Exam:  Introitus: Vagina is positive for vaginal discharge.   Breasts: breasts appear normal, no suspicious masses, no skin or nipple changes or axillary nodes, tender.     Assessment:    Pregnancy: G1P0000 Patient Active Problem List   Diagnosis Date Noted  . Supervision of high risk pregnancy, antepartum 12/13/2018  . Acute metabolic encephalopathy 11/20/2018  . Hypomagnesemia 11/20/2018  . Acute narcotic withdrawal (HCC) 11/20/2018  . Acute upper GI bleed 11/20/2018  . Hepatitis C antibody test positive 12/23/2017  . Polysubstance abuse (HCC) 12/20/2017  . Acute encephalopathy 12/20/2017   . Cocaine overdose, accidental or unintentional, initial encounter (HCC) 07/31/2017  . Hypokalemia 07/31/2017  . Normochromic normocytic anemia 07/31/2017  . Opioid type dependence, continuous (HCC) 02/16/2015  . MDD (major depressive disorder), severe (HCC) 02/11/2015  . Major depressive disorder without psychotic features 02/09/2015  . Mood disorder (HCC) 02/09/2015  . UTI (urinary tract infection)        Plan:     Initial labs drawn. Prenatal vitamins. Problem list reviewed and updated. AFP3 discussed: too late. Role of ultrasound in pregnancy discussed; fetal survey: ordered. Amniocentesis discussed: not indicated. Follow up in 3 weeks. 50% of 30 min visit spent on counseling and coordination of care.     Scheryl Darter 12/13/2018

## 2018-12-13 NOTE — Patient Instructions (Signed)

## 2018-12-15 ENCOUNTER — Encounter: Payer: Self-pay | Admitting: Medical

## 2018-12-15 ENCOUNTER — Other Ambulatory Visit (HOSPITAL_COMMUNITY): Payer: Self-pay

## 2018-12-15 LAB — URINE CULTURE, OB REFLEX

## 2018-12-15 LAB — CULTURE, OB URINE

## 2018-12-16 ENCOUNTER — Other Ambulatory Visit: Payer: Self-pay | Admitting: Obstetrics & Gynecology

## 2018-12-16 DIAGNOSIS — N3000 Acute cystitis without hematuria: Secondary | ICD-10-CM

## 2018-12-16 LAB — CYTOLOGY - PAP
Diagnosis: NEGATIVE
HPV: NOT DETECTED
Trichomonas: POSITIVE — AB

## 2018-12-16 MED ORDER — NITROFURANTOIN MONOHYD MACRO 100 MG PO CAPS: 100.0000 mg | ORAL_CAPSULE | Freq: Two times a day (BID) | ORAL | 1 refills | Status: DC

## 2018-12-16 NOTE — Progress Notes (Signed)
Macrodantin for UTI

## 2018-12-17 ENCOUNTER — Telehealth: Payer: Self-pay | Admitting: *Deleted

## 2018-12-17 ENCOUNTER — Other Ambulatory Visit: Payer: Self-pay | Admitting: Obstetrics & Gynecology

## 2018-12-17 DIAGNOSIS — N3000 Acute cystitis without hematuria: Secondary | ICD-10-CM

## 2018-12-17 MED ORDER — METRONIDAZOLE 500 MG PO TABS: ORAL_TABLET | ORAL | 0 refills | Status: DC

## 2018-12-17 MED ORDER — NITROFURANTOIN MONOHYD MACRO 100 MG PO CAPS: 100.0000 mg | ORAL_CAPSULE | Freq: Two times a day (BID) | ORAL | 1 refills | Status: DC

## 2018-12-17 NOTE — Telephone Encounter (Signed)
-----   Message from Adam Phenix, MD sent at 12/16/2018  3:28 PM EST ----- E. Coli bacteriuria, Rx Macrobid

## 2018-12-17 NOTE — Progress Notes (Signed)
Flagyl for trichomonas on pap

## 2018-12-17 NOTE — Telephone Encounter (Signed)
Diane Foley at the [facility] at [phone] to inform her of the pt's UTI.  [name] did not pick up.  Left message requesting a call back.

## 2018-12-17 NOTE — Telephone Encounter (Signed)
Received return call from Gore with [facility] who transferred me to [facility]. Informed Kenney Houseman that the pt has a UTI and had been prescribed Macrobid. She informed me that I needed to fax the order to [phone] and they will [facility] the jail.    Prescription printed and faxed today, 12/17/18 @ [phone]

## 2018-12-20 ENCOUNTER — Telehealth: Payer: Self-pay | Admitting: *Deleted

## 2018-12-20 DIAGNOSIS — N3000 Acute cystitis without hematuria: Secondary | ICD-10-CM

## 2018-12-20 DIAGNOSIS — A599 Trichomoniasis, unspecified: Secondary | ICD-10-CM

## 2018-12-20 MED ORDER — METRONIDAZOLE 500 MG PO TABS: ORAL_TABLET | ORAL | 0 refills | Status: DC

## 2018-12-20 NOTE — Telephone Encounter (Signed)
-----   Message from Adam Phenix, MD sent at 12/17/2018 10:44 AM EST ----- Rx for Flagyl sent for trichomonas on pap

## 2018-12-20 NOTE — Telephone Encounter (Signed)
Called to inform pt of positive trich.  Pt currently incarcerated in [facility].  Called Dion with [facility] @ [phone] requesting to speak with a nurse regarding pt's positive trich result and need for medication.  Dion transferred to me to Dr. Jethro Poling.  Informed Dr. Jethro Poling of pt's diagnosis and need for treatment.  He instructed that I fax the prescription to [phone].  Prescription faxed today, 12/20/18 at 1101.

## 2019-01-03 ENCOUNTER — Other Ambulatory Visit: Payer: Self-pay | Admitting: *Deleted

## 2019-01-03 ENCOUNTER — Ambulatory Visit (INDEPENDENT_AMBULATORY_CARE_PROVIDER_SITE_OTHER): Admitting: Family Medicine

## 2019-01-03 ENCOUNTER — Encounter: Payer: Self-pay | Admitting: Advanced Practice Midwife

## 2019-01-03 ENCOUNTER — Other Ambulatory Visit: Payer: Self-pay

## 2019-01-03 VITALS — BP 98/63 | HR 78 | Wt 127.1 lb

## 2019-01-03 DIAGNOSIS — Z23 Encounter for immunization: Secondary | ICD-10-CM | POA: Diagnosis not present

## 2019-01-03 DIAGNOSIS — F1910 Other psychoactive substance abuse, uncomplicated: Secondary | ICD-10-CM | POA: Diagnosis not present

## 2019-01-03 DIAGNOSIS — O0990 Supervision of high risk pregnancy, unspecified, unspecified trimester: Secondary | ICD-10-CM

## 2019-01-03 DIAGNOSIS — N3000 Acute cystitis without hematuria: Secondary | ICD-10-CM

## 2019-01-03 DIAGNOSIS — O0992 Supervision of high risk pregnancy, unspecified, second trimester: Secondary | ICD-10-CM

## 2019-01-03 DIAGNOSIS — R768 Other specified abnormal immunological findings in serum: Secondary | ICD-10-CM

## 2019-01-03 LAB — POCT URINALYSIS DIP (DEVICE)
Bilirubin Urine: NEGATIVE
Glucose, UA: NEGATIVE mg/dL
Hgb urine dipstick: NEGATIVE
Ketones, ur: NEGATIVE mg/dL
Nitrite: NEGATIVE
Protein, ur: NEGATIVE mg/dL
Specific Gravity, Urine: 1.01 (ref 1.005–1.030)
Urobilinogen, UA: 0.2 mg/dL (ref 0.0–1.0)
pH: 6 (ref 5.0–8.0)

## 2019-01-03 MED ORDER — NITROFURANTOIN MONOHYD MACRO 100 MG PO CAPS: 100.0000 mg | ORAL_CAPSULE | Freq: Two times a day (BID) | ORAL | 1 refills | Status: DC

## 2019-01-03 NOTE — Progress Notes (Signed)
   PRENATAL VISIT NOTE  Subjective:  Diane Foley is a 33 y.o. G1P0000 at [redacted]w[redacted]d being seen today for ongoing prenatal care.  She is currently monitored for the following issues for this high-risk pregnancy and has UTI (urinary tract infection); Major depressive disorder without psychotic features; Mood disorder (HCC); MDD (major depressive disorder), severe (HCC); Opioid type dependence, continuous (HCC); Cocaine overdose, accidental or unintentional, initial encounter (HCC); Normochromic normocytic anemia; Polysubstance abuse (HCC); Hepatitis C antibody test positive; and Supervision of high risk pregnancy, antepartum on their problem list.  Patient reports no complaints.  Contractions: Not present. Vag. Bleeding: None.  Movement: Present. Denies leaking of fluid.   The following portions of the patient's history were reviewed and updated as appropriate: allergies, current medications, past family history, past medical history, past social history, past surgical history and problem list. Problem list updated.  Objective:   Vitals:   01/03/19 0943  BP: 98/63  Pulse: 78  Weight: 127 lb 1.6 oz (57.7 kg)    Fetal Status: Fetal Heart Rate (bpm): 140 Fundal Height: 27 cm Movement: Present     General:  Alert, oriented and cooperative. Patient is in no acute distress.  Skin: Skin is warm and dry. No rash noted.   Cardiovascular: Normal heart rate noted  Respiratory: Normal respiratory effort, no problems with respiration noted  Abdomen: Soft, gravid, appropriate for gestational age.  Pain/Pressure: Absent     Pelvic: Cervical exam deferred        Extremities: Normal range of motion.  Edema: None  Mental Status: Normal mood and affect. Normal behavior. Normal judgment and thought content.   Assessment and Plan:  Pregnancy: G1P0000 at [redacted]w[redacted]d  1. Supervision of high risk pregnancy, antepartum FHT and FH normal - Glucose Tolerance, 2 Hours w/1 Hour; Future  2. Polysubstance abuse (HCC) On  methadone - Glucose Tolerance, 2 Hours w/1 Hour; Future  3. Acute cystitis without hematuria - nitrofurantoin, macrocrystal-monohydrate, (MACROBID) 100 MG capsule; Take 1 capsule (100 mg total) by mouth 2 (two) times daily.  Dispense: 14 capsule; Refill: 1  4. Hepatitis C antibody test positive   Preterm labor symptoms and general obstetric precautions including but not limited to vaginal bleeding, contractions, leaking of fluid and fetal movement were reviewed in detail with the patient. Please refer to After Visit Summary for other counseling recommendations.  Return in about 4 weeks (around 01/31/2019) for HR OB f/u.  No future appointments.  Levie Heritage, DO

## 2019-01-03 NOTE — Progress Notes (Signed)
Glucola cancelled today because patient ate. Patient not sure if she was treated for trich[facility] at the jail- states they just hand the pills to her. Offered tdap today-not sure , will let us know.  Called [facility] office at [phone] and asked if patient was treated with flagyl 4 tabs as requested for trichomonas and also if she received macrobid for 7 days bid.  Jail states she was treated with flagyl- but does not see that she was treated with macrobid- states if needs that to put on her paperwork today.

## 2019-01-06 ENCOUNTER — Telehealth: Payer: Self-pay | Admitting: Emergency Medicine

## 2019-01-06 NOTE — Telephone Encounter (Signed)
Pt was contacted regarding message left on nurse voicemail line regarding her prescription request. Pt stated that she lost the prescription for her antibiotic to treat her bladder infection. Per chart review, pt was prescribed Macrobid 100 mg BID on 1/27. Pt requested prescription be sent to [facility] on [address]   Prescription order was put in at the pt pharmacy of choice. Pt was contacted and informed of order. Pt was advised to take the medication as prescribed and to call if she had any questions, comments, or concerns. Pt had no further questions.

## 2019-01-06 NOTE — Telephone Encounter (Signed)
Pt called and left a message on the nurse voicemail line stating that she had a bladder infection and she lost her prescription for the antibiotic.

## 2019-01-11 ENCOUNTER — Telehealth: Payer: Self-pay

## 2019-01-11 MED ORDER — PRENATAL PLUS 27-1 MG PO TABS: 1.0000 | ORAL_TABLET | Freq: Every day | ORAL | 6 refills | Status: DC

## 2019-01-11 NOTE — Telephone Encounter (Signed)
Pt called requesting Rx for PNV.  Contacted pt and notified pt that we have sent an Rx for PNV to her pharmacy.  Pt stated thank you.

## 2019-01-20 ENCOUNTER — Telehealth: Payer: Self-pay | Admitting: *Deleted

## 2019-01-20 DIAGNOSIS — F1120 Opioid dependence, uncomplicated: Secondary | ICD-10-CM

## 2019-01-20 DIAGNOSIS — O09899 Supervision of other high risk pregnancies, unspecified trimester: Secondary | ICD-10-CM

## 2019-01-20 DIAGNOSIS — Z141 Cystic fibrosis carrier: Secondary | ICD-10-CM

## 2019-01-20 DIAGNOSIS — F1910 Other psychoactive substance abuse, uncomplicated: Secondary | ICD-10-CM

## 2019-01-20 DIAGNOSIS — O0990 Supervision of high risk pregnancy, unspecified, unspecified trimester: Secondary | ICD-10-CM

## 2019-01-20 DIAGNOSIS — R768 Other specified abnormal immunological findings in serum: Secondary | ICD-10-CM

## 2019-01-20 LAB — OBSTETRIC PANEL, INCLUDING HIV
Antibody Screen: NEGATIVE
Basophils Absolute: 0 10*3/uL (ref 0.0–0.2)
Basos: 0 %
EOS (ABSOLUTE): 0.1 10*3/uL (ref 0.0–0.4)
Eos: 1 %
HIV Screen 4th Generation wRfx: NONREACTIVE
Hematocrit: 32.9 % — ABNORMAL LOW (ref 34.0–46.6)
Hemoglobin: 11.6 g/dL (ref 11.1–15.9)
Hepatitis B Surface Ag: NEGATIVE
Immature Grans (Abs): 0.1 10*3/uL (ref 0.0–0.1)
Immature Granulocytes: 1 %
LYMPHS ABS: 1.8 10*3/uL (ref 0.7–3.1)
Lymphs: 18 %
MCH: 31.7 pg (ref 26.6–33.0)
MCHC: 35.3 g/dL (ref 31.5–35.7)
MCV: 90 fL (ref 79–97)
Monocytes Absolute: 0.6 10*3/uL (ref 0.1–0.9)
Monocytes: 6 %
Neutrophils Absolute: 7.1 10*3/uL — ABNORMAL HIGH (ref 1.4–7.0)
Neutrophils: 74 %
Platelets: 239 10*3/uL (ref 150–450)
RBC: 3.66 x10E6/uL — ABNORMAL LOW (ref 3.77–5.28)
RDW: 12.6 % (ref 11.7–15.4)
RH TYPE: POSITIVE
RPR Ser Ql: NONREACTIVE
Rubella Antibodies, IGG: 1.3 index (ref >0.99)
WBC: 9.7 10*3/uL (ref 3.4–10.8)

## 2019-01-20 LAB — HEMOGLOBINOPATHY EVALUATION
Ferritin: 23 ng/mL (ref 15–150)
HGB A2 QUANT: 2.2 % (ref 1.8–3.2)
Hgb A: 97.8 % (ref 96.4–98.8)
Hgb C: 0 %
Hgb F Quant: 0 % (ref 0.0–2.0)
Hgb S: 0 %
Hgb Solubility: NEGATIVE
Hgb Variant: 0 %

## 2019-01-20 LAB — INHERITEST(R) CF/SMA PANEL

## 2019-01-20 NOTE — Telephone Encounter (Signed)
I called Diane Foley and a female answered who said Diane Foley is not there but she would give her a message. I asked her to tell Diane Foley I am calling with some non urgent information but we need to talk with her and to please call back. Of note Diane Foley does not have MyChart.

## 2019-01-20 NOTE — Telephone Encounter (Signed)
-----   Message from Adam PhenixJames G Arnold, MD sent at 01/20/2019  1:49 PM EST ----- CF carrier, offer genetic counseling

## 2019-01-21 NOTE — Telephone Encounter (Signed)
Naila called back and left a message she is returning our call.I called Jeanice Lim and informed her per Dr. 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 She voices understanding and would like to meet with genetic counselor. I explained I will schedule and call her back with appointment.   I scheduled appointment for first available on 01/28/19 10:00. I called Donetta and left a message I have scheduled her appointment we discussed for first available and left date/ time/location and to call back if questions.

## 2019-01-21 NOTE — Telephone Encounter (Signed)
I called [facility]'s number on file and was told by a female that she gave Lillyin the message today to call us today.

## 2019-01-25 ENCOUNTER — Other Ambulatory Visit (HOSPITAL_COMMUNITY)
Admission: RE | Admit: 2019-01-25 | Discharge: 2019-01-25 | Disposition: A | Discharge status: Home / Self Care | Payer: Medicaid Other | Source: Ambulatory Visit | Attending: Obstetrics & Gynecology | Admitting: Obstetrics & Gynecology

## 2019-01-25 ENCOUNTER — Ambulatory Visit (INDEPENDENT_AMBULATORY_CARE_PROVIDER_SITE_OTHER): Payer: Medicaid Other | Admitting: Obstetrics & Gynecology

## 2019-01-25 VITALS — BP 128/74 | HR 85 | Wt 130.9 lb

## 2019-01-25 DIAGNOSIS — O0990 Supervision of high risk pregnancy, unspecified, unspecified trimester: Secondary | ICD-10-CM

## 2019-01-25 DIAGNOSIS — R768 Other specified abnormal immunological findings in serum: Secondary | ICD-10-CM

## 2019-01-25 DIAGNOSIS — A599 Trichomoniasis, unspecified: Secondary | ICD-10-CM | POA: Insufficient documentation

## 2019-01-25 DIAGNOSIS — R7689 Other specified abnormal immunological findings in serum: Secondary | ICD-10-CM

## 2019-01-25 DIAGNOSIS — Z3A30 30 weeks gestation of pregnancy: Secondary | ICD-10-CM

## 2019-01-25 DIAGNOSIS — T405X1A Poisoning by cocaine, accidental (unintentional), initial encounter: Secondary | ICD-10-CM

## 2019-01-25 DIAGNOSIS — O0993 Supervision of high risk pregnancy, unspecified, third trimester: Secondary | ICD-10-CM

## 2019-01-25 DIAGNOSIS — F1120 Opioid dependence, uncomplicated: Secondary | ICD-10-CM

## 2019-01-25 NOTE — Progress Notes (Signed)
   PRENATAL VISIT NOTE  Subjective:  Diane Foley is a 33 y.o. G1P0000 at [redacted]w[redacted]d being seen today for ongoing prenatal care.  She is currently monitored for the following issues for this high-risk pregnancy and has UTI (urinary tract infection); Major depressive disorder without psychotic features; Mood disorder (HCC); MDD (major depressive disorder), severe (HCC); Opioid type dependence, continuous (HCC); Cocaine overdose, accidental or unintentional, initial encounter (HCC); Normochromic normocytic anemia; Polysubstance abuse (HCC); Hepatitis C antibody test positive; Supervision of high risk pregnancy, antepartum; and Cystic fibrosis carrier, antepartum on their problem list.  Patient reports complains that she needs a higher methadone dose.  Contractions: Not present. Vag. Bleeding: None.  Movement: Present. Denies leaking of fluid.   The following portions of the patient's history were reviewed and updated as appropriate: allergies, current medications, past family history, past medical history, past social history, past surgical history and problem list. Problem list updated.  Objective:   Vitals:   01/25/19 0919  BP: 128/74  Pulse: 85  Weight: 130 lb 14.4 oz (59.4 kg)    Fetal Status: Fetal Heart Rate (bpm): 134   Movement: Present     General:  Alert, oriented and cooperative. Patient is in no acute distress.  Skin: Skin is warm and dry. No rash noted.   Cardiovascular: Normal heart rate noted  Respiratory: Normal respiratory effort, no problems with respiration noted  Abdomen: Soft, gravid, appropriate for gestational age.  Pain/Pressure: Present     Pelvic: Cervical exam deferred        Extremities: Normal range of motion.  Edema: None  Mental Status: Normal mood and affect. Normal behavior. Normal judgment and thought content.   Assessment and Plan:  Pregnancy: G1P0000 at [redacted]w[redacted]d  1. Opioid type dependence, continuous (HCC) - she is on methadone, complaining that she does  not have a high enough dose. However, she is falling asleep in front of me, mid conversation. I have told her that she needs to discuss this with the provider that prescribes her methadone  2. Cocaine overdose, accidental or unintentional, initial encounter (HCC)   3. Hepatitis C antibody test positive   4. Supervision of high risk pregnancy, antepartum  - [place] MFM OB FOLLOW UP; Future - She will need 28 week labs and 2 hour GTT in a week as she is not fasting this morning  5. Trichomoniasis  - Cervicovaginal ancillary only( [place])  Preterm labor symptoms and general obstetric precautions including but not limited to vaginal bleeding, contractions, leaking of fluid and fetal movement were reviewed in detail with the patient. Please refer to After Visit Summary for other counseling recommendations.  Return in about 1 week (around 02/01/2019) for for 2 hour GTT.  Future Appointments  Date Time Provider Department Center  01/28/2019 10:00 AM [facility] GENETIC COUNSELING RM [facility]    Allie Bossier, MD

## 2019-01-26 ENCOUNTER — Encounter (HOSPITAL_COMMUNITY): Payer: Self-pay

## 2019-01-26 LAB — CERVICOVAGINAL ANCILLARY ONLY
Bacterial vaginitis: NEGATIVE
Candida vaginitis: POSITIVE — AB
Chlamydia: NEGATIVE
Neisseria Gonorrhea: NEGATIVE
TRICH (WINDOWPATH): POSITIVE — AB

## 2019-01-27 ENCOUNTER — Telehealth: Payer: Self-pay

## 2019-01-27 ENCOUNTER — Other Ambulatory Visit: Payer: Self-pay

## 2019-01-27 DIAGNOSIS — B379 Candidiasis, unspecified: Secondary | ICD-10-CM

## 2019-01-27 DIAGNOSIS — A599 Trichomoniasis, unspecified: Secondary | ICD-10-CM

## 2019-01-27 MED ORDER — METRONIDAZOLE 500 MG PO TABS: 500.0000 mg | ORAL_TABLET | Freq: Two times a day (BID) | ORAL | 0 refills | Status: DC

## 2019-01-27 MED ORDER — TERCONAZOLE 0.4 % VA CREA: 1.0000 | TOPICAL_CREAM | Freq: Every day | VAGINAL | 0 refills | Status: DC

## 2019-01-27 NOTE — Telephone Encounter (Signed)
-----   Message from Allie Bossier, MD sent at 01/27/2019  8:11 AM EST ----- She needs to be treated for trich. Please tell her not to have sex with partner until he has been treated. Thanks

## 2019-01-27 NOTE — Telephone Encounter (Signed)
Called pt about her results and advised her to pick up rx from pharmacy, pt verbalized understanding and said she would.

## 2019-01-27 NOTE — Telephone Encounter (Signed)
Called pt to advise of test results & that her meds has been sent to her Pharmacy on file. No answer, Left VM for call back.

## 2019-01-28 ENCOUNTER — Ambulatory Visit (HOSPITAL_COMMUNITY): Attending: Obstetrics and Gynecology

## 2019-01-28 ENCOUNTER — Ambulatory Visit (HOSPITAL_COMMUNITY)
Admission: RE | Admit: 2019-01-28 | Source: Ambulatory Visit | Attending: Obstetrics & Gynecology | Admitting: Obstetrics & Gynecology

## 2019-02-01 ENCOUNTER — Other Ambulatory Visit: Payer: Medicaid Other

## 2019-02-01 ENCOUNTER — Other Ambulatory Visit: Payer: Self-pay | Admitting: Family Medicine

## 2019-02-01 DIAGNOSIS — F1910 Other psychoactive substance abuse, uncomplicated: Secondary | ICD-10-CM

## 2019-02-01 DIAGNOSIS — O0990 Supervision of high risk pregnancy, unspecified, unspecified trimester: Secondary | ICD-10-CM

## 2019-02-02 ENCOUNTER — Telehealth: Payer: Self-pay | Admitting: Medical

## 2019-02-02 LAB — GLUCOSE TOLERANCE, 2 HOURS W/ 1HR
Glucose, 1 hour: 86 mg/dL (ref 65–179)
Glucose, 2 hour: 73 mg/dL (ref 65–152)
Glucose, Fasting: 63 mg/dL — ABNORMAL LOW (ref 65–91)

## 2019-02-02 LAB — CBC
HEMOGLOBIN: 11.6 g/dL (ref 11.1–15.9)
Hematocrit: 33.1 % — ABNORMAL LOW (ref 34.0–46.6)
MCH: 30.9 pg (ref 26.6–33.0)
MCHC: 35 g/dL (ref 31.5–35.7)
MCV: 88 fL (ref 79–97)
Platelets: 290 10*3/uL (ref 150–450)
RBC: 3.75 x10E6/uL — ABNORMAL LOW (ref 3.77–5.28)
RDW: 12.3 % (ref 11.7–15.4)
WBC: 8.9 10*3/uL (ref 3.4–10.8)

## 2019-02-02 LAB — ABO AND RH: Rh Factor: POSITIVE

## 2019-02-02 LAB — ANTIBODY SCREEN: Antibody Screen: NEGATIVE

## 2019-02-02 NOTE — Telephone Encounter (Signed)
The patient called our call center and left a message to reschedule. Called the patient to inform of upcoming appointment and ask if she would like to reschedule. Left a message and sending a reminder letter.

## 2019-02-08 ENCOUNTER — Encounter: Payer: Self-pay | Admitting: Obstetrics & Gynecology

## 2019-02-08 ENCOUNTER — Ambulatory Visit (INDEPENDENT_AMBULATORY_CARE_PROVIDER_SITE_OTHER): Payer: Medicaid Other | Admitting: Clinical

## 2019-02-08 ENCOUNTER — Ambulatory Visit (INDEPENDENT_AMBULATORY_CARE_PROVIDER_SITE_OTHER): Payer: Medicaid Other | Admitting: Obstetrics & Gynecology

## 2019-02-08 VITALS — BP 118/76 | HR 78 | Wt 131.3 lb

## 2019-02-08 DIAGNOSIS — Z658 Other specified problems related to psychosocial circumstances: Secondary | ICD-10-CM | POA: Diagnosis not present

## 2019-02-08 DIAGNOSIS — Z3A35 35 weeks gestation of pregnancy: Secondary | ICD-10-CM

## 2019-02-08 DIAGNOSIS — F1120 Opioid dependence, uncomplicated: Secondary | ICD-10-CM

## 2019-02-08 DIAGNOSIS — F1910 Other psychoactive substance abuse, uncomplicated: Secondary | ICD-10-CM

## 2019-02-08 DIAGNOSIS — O0990 Supervision of high risk pregnancy, unspecified, unspecified trimester: Secondary | ICD-10-CM

## 2019-02-08 DIAGNOSIS — O99323 Drug use complicating pregnancy, third trimester: Secondary | ICD-10-CM

## 2019-02-08 NOTE — Progress Notes (Signed)
   PRENATAL VISIT NOTE  Subjective:  Diane Foley is a 33 y.o. G1P0000 at [redacted]w[redacted]d being seen today for ongoing prenatal care.  She is currently monitored for the following issues for this high-risk pregnancy and has UTI (urinary tract infection); Major depressive disorder without psychotic features; Mood disorder (HCC); MDD (major depressive disorder), severe (HCC); Opioid type dependence, continuous (HCC); Cocaine overdose, accidental or unintentional, initial encounter (HCC); Normochromic normocytic anemia; Polysubstance abuse (HCC); Hepatitis C antibody test positive; Supervision of high risk pregnancy, antepartum; and Cystic fibrosis carrier, antepartum on their problem list.  Patient reports left sciatica pain.  Contractions: Not present. Vag. Bleeding: None.  Movement: Present. Denies leaking of fluid.   The following portions of the patient's history were reviewed and updated as appropriate: allergies, current medications, past family history, past medical history, past social history, past surgical history and problem list. Problem list updated.  Objective:   Vitals:   02/08/19 0914  BP: 118/76  Pulse: 78  Weight: 131 lb 4.8 oz (59.6 kg)    Fetal Status: Fetal Heart Rate (bpm): 126   Movement: Present     General:  Alert, oriented and cooperative. Patient is in no acute distress.  Skin: Skin is warm and dry. No rash noted.   Cardiovascular: Normal heart rate noted  Respiratory: Normal respiratory effort, no problems with respiration noted  Abdomen: Soft, gravid, appropriate for gestational age.  Pain/Pressure: Present     Pelvic: Cervical exam deferred        Extremities: Normal range of motion.  Edema: None  Mental Status: Normal mood and affect. Normal behavior. Normal judgment and thought content.   Assessment and Plan:  Pregnancy: G1P0000 at [redacted]w[redacted]d  1. Supervision of high risk pregnancy, antepartum - MFM u/s this Friday  2. Opioid type dependence, continuous (HCC) - on  methadone 3. Sciatica pain - I have demonstrated exercises to help with this issue   Preterm labor symptoms and general obstetric precautions including but not limited to vaginal bleeding, contractions, leaking of fluid and fetal movement were reviewed in detail with the patient. Please refer to After Visit Summary for other counseling recommendations.  Return in about 2 weeks (around 02/22/2019) for cancel next week's appt.  Future Appointments  Date Time Provider Department Center  02/08/2019 11:30 AM [facility] CLINICIAN [facility]  02/11/2019  8:00 AM [facility] [place] 3 [facility]  02/16/2019  9:15 AM Bruni Bing, MD [facility]    Allie Bossier, MD

## 2019-02-08 NOTE — BH Specialist Note (Signed)
Integrated Behavioral Health Initial Visit  MRN: [id number] Name: Lavergne Cabler  Number of Integrated Behavioral Health Clinician visits:: 1/6 Session Start time: 9:33 Session End time: 9:52 Total time: 20 minutes  Type of Service: Integrated Behavioral Health- Individual/Family Interpretor:No. Interpretor Name and Language: n/a   Warm Hand Off Completed.       SUBJECTIVE: Adlie Boehnlein is a 33 y.o. female accompanied by n/a Patient was referred by Nicholaus Bloom, MD for life stress. Patient reports the following symptoms/concerns: Pt states her primary concern is feeling stress over changes adjusting to her first pregnancy, while  attending methadone clinic daily. Pt's most pressing symptom today is fatigue, followed by withdrawal symptoms in the hour prior to morning clinic, and is requesting information about what to expect at the new hospital.  Duration of problem: Current pregnancy; Severity of problem: moderate  OBJECTIVE: Mood: Anxious and Affect: Appropriate Risk of harm to self or others: No plan to harm self or others  LIFE CONTEXT: Family and Social: Pt lives with family; family is very supportive.  School/Work: - Self-Care: Pt has been attending Methadone clinic after positive pregnancy at 67mo gestation  Life Changes: Current first pregnancy. Pt found out she was pregnant as an inmate, at 90mo gestation, and began attending Methadone clinic daily upon discharge. Pt says jail "was the best thing for me and my baby"  GOALS ADDRESSED: Patient will: 1. Reduce symptoms of: stress 2. Increase knowledge and/or ability of: healthy habits  3. Demonstrate ability to: Increase healthy adjustment to current life circumstances  INTERVENTIONS: Interventions utilized: Motivational Interviewing, Psychoeducation and/or Health Education and Link to [employer]  Standardized Assessments completed: GAD-7 and PHQ 9  ASSESSMENT: Patient currently experiencing Opioid use disorder,  moderate, in controlled environment, dependence and Psychosocial stress   Patient may benefit from psychoeducation and brief therapeutic interventions regarding coping with life stress .  PLAN: 1. Follow up with behavioral health clinician on : Next medical appointment 2. Behavioral recommendations:  -Continue attending daily methadone clinic  -Continue taking prenatal vitamin (with iron) daily -Continue using sleep sounds at night; switch from sometimes to every night at bedtime -Consider apps discussed for distraction in early hours prior to attending Methadone clinic -Take home [place] Postpartum Planner; share with family to begin planning for postpartum time -Watch [facility] Virtual Tour at home again, with family 3. Referral(s): Integrated Behavioral Health Services (In Clinic) 4. "From scale of 1-10, how likely are you to follow plan?": -  Rae Lips, LCSW  Depression screen [facility] 2/9 01/25/2019  Decreased Interest 2  Down, Depressed, Hopeless 2  PHQ - 2 Score 4  Altered sleeping 3  Tired, decreased energy 3  Change in appetite 0  Feeling bad or failure about yourself  2  Trouble concentrating 2  Moving slowly or fidgety/restless 1  Suicidal thoughts 0  PHQ-9 Score 15   GAD 7 : Generalized Anxiety Score 01/25/2019  Nervous, Anxious, on Edge 3  Control/stop worrying 2  Worry too much - different things 2  Trouble relaxing 2  Restless 2  Easily annoyed or irritable 1  Afraid - awful might happen 1  Total GAD 7 Score 13

## 2019-02-11 ENCOUNTER — Other Ambulatory Visit (HOSPITAL_COMMUNITY): Payer: Self-pay | Admitting: *Deleted

## 2019-02-11 ENCOUNTER — Ambulatory Visit (HOSPITAL_COMMUNITY)
Admission: RE | Admit: 2019-02-11 | Discharge: 2019-02-11 | Disposition: A | Discharge status: Home / Self Care | Payer: Medicaid Other | Source: Ambulatory Visit | Attending: Obstetrics and Gynecology | Admitting: Obstetrics and Gynecology

## 2019-02-11 DIAGNOSIS — O98413 Viral hepatitis complicating pregnancy, third trimester: Secondary | ICD-10-CM

## 2019-02-11 DIAGNOSIS — O0990 Supervision of high risk pregnancy, unspecified, unspecified trimester: Secondary | ICD-10-CM | POA: Diagnosis present

## 2019-02-11 DIAGNOSIS — O0933 Supervision of pregnancy with insufficient antenatal care, third trimester: Secondary | ICD-10-CM

## 2019-02-11 DIAGNOSIS — Z362 Encounter for other antenatal screening follow-up: Secondary | ICD-10-CM

## 2019-02-11 DIAGNOSIS — O99323 Drug use complicating pregnancy, third trimester: Secondary | ICD-10-CM | POA: Diagnosis not present

## 2019-02-11 DIAGNOSIS — O99320 Drug use complicating pregnancy, unspecified trimester: Principal | ICD-10-CM

## 2019-02-11 DIAGNOSIS — Z3A33 33 weeks gestation of pregnancy: Secondary | ICD-10-CM

## 2019-02-11 DIAGNOSIS — B182 Chronic viral hepatitis C: Secondary | ICD-10-CM

## 2019-02-11 DIAGNOSIS — F1910 Other psychoactive substance abuse, uncomplicated: Secondary | ICD-10-CM

## 2019-02-11 DIAGNOSIS — F1120 Opioid dependence, uncomplicated: Secondary | ICD-10-CM

## 2019-02-11 DIAGNOSIS — O99332 Smoking (tobacco) complicating pregnancy, second trimester: Secondary | ICD-10-CM

## 2019-02-11 NOTE — Progress Notes (Unsigned)
.  mfm

## 2019-02-16 ENCOUNTER — Encounter: Payer: Self-pay | Admitting: Obstetrics and Gynecology

## 2019-02-16 ENCOUNTER — Telehealth: Payer: Self-pay | Admitting: Obstetrics and Gynecology

## 2019-02-16 NOTE — Telephone Encounter (Signed)
The patient called back and left a message with the nurse to call her back. Informed the patient of the rescheduled appointment also sending a reminder letter.

## 2019-02-16 NOTE — Telephone Encounter (Signed)
Callled the patient on the contact number available in Epic. The caller asked if I could call a different number to reach out to the patient [phone].  No answer on the requested line. The voicemail was for a [employer]. Left a message requesting that [facility] call [facility] along with a call back number. Sending a missed appointment letter.

## 2019-02-21 ENCOUNTER — Encounter: Payer: Self-pay | Admitting: Obstetrics and Gynecology

## 2019-03-11 ENCOUNTER — Encounter (HOSPITAL_COMMUNITY): Payer: Self-pay

## 2019-03-11 ENCOUNTER — Ambulatory Visit (HOSPITAL_COMMUNITY): Payer: Medicaid Other

## 2019-03-29 ENCOUNTER — Encounter: Payer: Self-pay | Admitting: Student

## 2019-03-30 ENCOUNTER — Telehealth: Payer: Self-pay | Admitting: Obstetrics and Gynecology

## 2019-03-30 NOTE — Telephone Encounter (Signed)
Called the patient to inform of the upcoming appointment. The patient verbalized understanding. °

## 2019-03-31 ENCOUNTER — Other Ambulatory Visit: Payer: Self-pay

## 2019-03-31 ENCOUNTER — Ambulatory Visit (INDEPENDENT_AMBULATORY_CARE_PROVIDER_SITE_OTHER): Payer: Medicaid Other | Admitting: Obstetrics & Gynecology

## 2019-03-31 VITALS — BP 122/82 | HR 87 | Wt 142.6 lb

## 2019-03-31 DIAGNOSIS — Z3A40 40 weeks gestation of pregnancy: Secondary | ICD-10-CM

## 2019-03-31 DIAGNOSIS — O0993 Supervision of high risk pregnancy, unspecified, third trimester: Secondary | ICD-10-CM

## 2019-03-31 DIAGNOSIS — O0990 Supervision of high risk pregnancy, unspecified, unspecified trimester: Secondary | ICD-10-CM

## 2019-03-31 NOTE — Progress Notes (Signed)
   PRENATAL VISIT NOTE  Subjective:  Diane Foley is a 33 y.o. G1P0000 at [redacted]w[redacted]d being seen today for ongoing prenatal care.  She is currently monitored for the following issues for this low-risk pregnancy and has UTI (urinary tract infection); Major depressive disorder without psychotic features; Mood disorder (HCC); MDD (major depressive disorder), severe (HCC); Opioid type dependence, continuous (HCC); Cocaine overdose, accidental or unintentional, initial encounter (HCC); Normochromic normocytic anemia; Polysubstance abuse (HCC); Hepatitis C antibody test positive; Supervision of high risk pregnancy, antepartum; and Cystic fibrosis carrier, antepartum on their problem list.  Patient reports small mucus d/c.  Contractions: Not present. Vag. Bleeding: None.  Movement: Present. Denies leaking of fluid.   The following portions of the patient's history were reviewed and updated as appropriate: allergies, current medications, past family history, past medical history, past social history, past surgical history and problem list.   Objective:   Vitals:   03/31/19 1133  BP: 122/82  Pulse: 87  Weight: 142 lb 9.6 oz (64.7 kg)    Fetal Status: Fetal Heart Rate (bpm): 118 Fundal Height: 37 cm Movement: Present     General:  Alert, oriented and cooperative. Patient is in no acute distress.  Skin: Skin is warm and dry. No rash noted.   Cardiovascular: Normal heart rate noted  Respiratory: Normal respiratory effort, no problems with respiration noted  Abdomen: Soft, gravid, appropriate for gestational age.  Pain/Pressure: Present     Pelvic: Cervical exam deferred        Extremities: Normal range of motion.  Edema: Trace  Mental Status: Normal mood and affect. Normal behavior. Normal judgment and thought content.   Assessment and Plan:  Pregnancy: G1P0000 at [redacted]w[redacted]d There are no diagnoses linked to this encounter. Term labor symptoms and general obstetric precautions including but not limited to  vaginal bleeding, contractions, leaking of fluid and fetal movement were reviewed in detail with the patient. Please refer to After Visit Summary for other counseling recommendations.   Return in about 4 days (around 04/04/2019) for NST afi.  Future Appointments  Date Time Provider Department Center  04/07/2019 12:00 AM [facility] SCHED ROOM [facility] None    Scheryl Darter, MD

## 2019-03-31 NOTE — Patient Instructions (Signed)

## 2019-04-01 ENCOUNTER — Inpatient Hospital Stay (HOSPITAL_COMMUNITY)
Admission: AD | Admit: 2019-04-01 | Discharge: 2019-04-04 | DRG: 806 | Disposition: A | Discharge status: Home / Self Care | Payer: Medicaid Other | Attending: Obstetrics and Gynecology | Admitting: Obstetrics and Gynecology

## 2019-04-01 ENCOUNTER — Encounter (HOSPITAL_COMMUNITY): Payer: Self-pay | Admitting: *Deleted

## 2019-04-01 ENCOUNTER — Other Ambulatory Visit: Payer: Self-pay | Admitting: Advanced Practice Midwife

## 2019-04-01 ENCOUNTER — Encounter (HOSPITAL_COMMUNITY): Payer: Self-pay

## 2019-04-01 ENCOUNTER — Other Ambulatory Visit: Payer: Self-pay

## 2019-04-01 ENCOUNTER — Inpatient Hospital Stay (HOSPITAL_COMMUNITY): Payer: Medicaid Other | Admitting: Certified Registered Nurse Anesthetist

## 2019-04-01 ENCOUNTER — Telehealth (HOSPITAL_COMMUNITY): Payer: Self-pay | Admitting: *Deleted

## 2019-04-01 DIAGNOSIS — O0990 Supervision of high risk pregnancy, unspecified, unspecified trimester: Secondary | ICD-10-CM

## 2019-04-01 DIAGNOSIS — Z3A40 40 weeks gestation of pregnancy: Secondary | ICD-10-CM | POA: Diagnosis not present

## 2019-04-01 DIAGNOSIS — F1410 Cocaine abuse, uncomplicated: Secondary | ICD-10-CM | POA: Diagnosis present

## 2019-04-01 DIAGNOSIS — F1120 Opioid dependence, uncomplicated: Secondary | ICD-10-CM | POA: Diagnosis present

## 2019-04-01 DIAGNOSIS — O4290 Premature rupture of membranes, unspecified as to length of time between rupture and onset of labor, unspecified weeks of gestation: Secondary | ICD-10-CM | POA: Diagnosis present

## 2019-04-01 DIAGNOSIS — B182 Chronic viral hepatitis C: Secondary | ICD-10-CM | POA: Diagnosis present

## 2019-04-01 DIAGNOSIS — O9842 Viral hepatitis complicating childbirth: Principal | ICD-10-CM | POA: Diagnosis present

## 2019-04-01 DIAGNOSIS — F17210 Nicotine dependence, cigarettes, uncomplicated: Secondary | ICD-10-CM | POA: Diagnosis present

## 2019-04-01 DIAGNOSIS — Z141 Cystic fibrosis carrier: Secondary | ICD-10-CM

## 2019-04-01 DIAGNOSIS — O99334 Smoking (tobacco) complicating childbirth: Secondary | ICD-10-CM | POA: Diagnosis present

## 2019-04-01 DIAGNOSIS — B1920 Unspecified viral hepatitis C without hepatic coma: Secondary | ICD-10-CM | POA: Diagnosis not present

## 2019-04-01 DIAGNOSIS — O99324 Drug use complicating childbirth: Secondary | ICD-10-CM | POA: Diagnosis present

## 2019-04-01 DIAGNOSIS — O26893 Other specified pregnancy related conditions, third trimester: Secondary | ICD-10-CM | POA: Diagnosis present

## 2019-04-01 DIAGNOSIS — F1910 Other psychoactive substance abuse, uncomplicated: Secondary | ICD-10-CM

## 2019-04-01 DIAGNOSIS — O09899 Supervision of other high risk pregnancies, unspecified trimester: Secondary | ICD-10-CM

## 2019-04-01 DIAGNOSIS — R768 Other specified abnormal immunological findings in serum: Secondary | ICD-10-CM | POA: Diagnosis present

## 2019-04-01 LAB — CBC
HCT: 34.9 % — ABNORMAL LOW (ref 36.0–46.0)
Hemoglobin: 11.9 g/dL — ABNORMAL LOW (ref 12.0–15.0)
MCH: 31.2 pg (ref 26.0–34.0)
MCHC: 34.1 g/dL (ref 30.0–36.0)
MCV: 91.4 fL (ref 80.0–100.0)
Platelets: 231 10*3/uL (ref 150–400)
RBC: 3.82 MIL/uL — ABNORMAL LOW (ref 3.87–5.11)
RDW: 14.5 % (ref 11.5–15.5)
WBC: 9.4 10*3/uL (ref 4.0–10.5)
nRBC: 0 % (ref 0.0–0.2)

## 2019-04-01 LAB — TYPE AND SCREEN
ABO/RH(D): O POS
Antibody Screen: NEGATIVE

## 2019-04-01 LAB — RAPID URINE DRUG SCREEN, HOSP PERFORMED
Amphetamines: NOT DETECTED
Barbiturates: NOT DETECTED
Benzodiazepines: NOT DETECTED
Cocaine: NOT DETECTED
Opiates: NOT DETECTED
Tetrahydrocannabinol: NOT DETECTED

## 2019-04-01 LAB — GROUP B STREP BY PCR: Group B strep by PCR: NEGATIVE

## 2019-04-01 MED ORDER — FENTANYL-BUPIVACAINE-NACL 0.5-0.125-0.9 MG/250ML-% EP SOLN
12.0000 mL/h | EPIDURAL | Status: DC | PRN
  Filled 2019-04-01: qty 250

## 2019-04-01 MED ORDER — LIDOCAINE HCL (PF) 1 % IJ SOLN
INTRAMUSCULAR | Status: DC | PRN
  Administered 2019-04-01: 11 mL

## 2019-04-01 MED ORDER — EPHEDRINE 5 MG/ML INJ: 10.0000 mg | INTRAVENOUS | Status: DC | PRN

## 2019-04-01 MED ORDER — OXYTOCIN BOLUS FROM INFUSION
500.0000 mL | Freq: Once | INTRAVENOUS | Status: AC
  Administered 2019-04-02: 500 mL via INTRAVENOUS

## 2019-04-01 MED ORDER — PHENYLEPHRINE 40 MCG/ML (10ML) SYRINGE FOR IV PUSH (FOR BLOOD PRESSURE SUPPORT): 80.0000 ug | PREFILLED_SYRINGE | INTRAVENOUS | Status: DC | PRN

## 2019-04-01 MED ORDER — ONDANSETRON HCL 4 MG/2ML IJ SOLN: 4.0000 mg | Freq: Four times a day (QID) | INTRAMUSCULAR | Status: DC | PRN

## 2019-04-01 MED ORDER — FENTANYL CITRATE (PF) 100 MCG/2ML IJ SOLN: 100.0000 ug | INTRAMUSCULAR | Status: DC | PRN

## 2019-04-01 MED ORDER — SOD CITRATE-CITRIC ACID 500-334 MG/5ML PO SOLN
30.0000 mL | ORAL | Status: DC | PRN
  Administered 2019-04-01: 20:00:00 30 mL via ORAL
  Filled 2019-04-01 (×2): qty 15

## 2019-04-01 MED ORDER — OXYCODONE-ACETAMINOPHEN 5-325 MG PO TABS: 2.0000 | ORAL_TABLET | ORAL | Status: DC | PRN

## 2019-04-01 MED ORDER — METHADONE HCL 10 MG PO TABS
70.0000 mg | ORAL_TABLET | ORAL | Status: DC
  Administered 2019-04-02 – 2019-04-04 (×3): 70 mg via ORAL
  Filled 2019-04-01 (×3): qty 7

## 2019-04-01 MED ORDER — LIDOCAINE HCL (PF) 1 % IJ SOLN: 30.0000 mL | INTRAMUSCULAR | Status: DC | PRN

## 2019-04-01 MED ORDER — OXYTOCIN 40 UNITS IN NORMAL SALINE INFUSION - SIMPLE MED
2.5000 [IU]/h | INTRAVENOUS | Status: DC
  Administered 2019-04-02: 2.5 [IU]/h via INTRAVENOUS

## 2019-04-01 MED ORDER — TERBUTALINE SULFATE 1 MG/ML IJ SOLN: 0.2500 mg | Freq: Once | INTRAMUSCULAR | Status: DC | PRN

## 2019-04-01 MED ORDER — OXYTOCIN 40 UNITS IN NORMAL SALINE INFUSION - SIMPLE MED
1.0000 m[IU]/min | INTRAVENOUS | Status: DC
  Administered 2019-04-01: 2 m[IU]/min via INTRAVENOUS
  Filled 2019-04-01: qty 1000

## 2019-04-01 MED ORDER — SODIUM CHLORIDE (PF) 0.9 % IJ SOLN
INTRAMUSCULAR | Status: DC | PRN
  Administered 2019-04-01: 14 mL/h via EPIDURAL

## 2019-04-01 MED ORDER — LACTATED RINGERS IV SOLN
INTRAVENOUS | Status: DC
  Administered 2019-04-01 (×2): via INTRAVENOUS

## 2019-04-01 MED ORDER — ACETAMINOPHEN 325 MG PO TABS: 650.0000 mg | ORAL_TABLET | ORAL | Status: DC | PRN

## 2019-04-01 MED ORDER — LACTATED RINGERS IV SOLN
500.0000 mL | Freq: Once | INTRAVENOUS | Status: AC
  Administered 2019-04-01: 500 mL via INTRAVENOUS

## 2019-04-01 MED ORDER — LACTATED RINGERS IV SOLN: 500.0000 mL | INTRAVENOUS | Status: DC | PRN

## 2019-04-01 MED ORDER — OXYCODONE-ACETAMINOPHEN 5-325 MG PO TABS: 1.0000 | ORAL_TABLET | ORAL | Status: DC | PRN

## 2019-04-01 MED ORDER — DIPHENHYDRAMINE HCL 50 MG/ML IJ SOLN: 12.5000 mg | INTRAMUSCULAR | Status: DC | PRN

## 2019-04-01 NOTE — Progress Notes (Signed)
Patient ID: Diane Foley, female   DOB: 24-Dec-1985, 33 y.o.   MRN: [id number]  Now comfortable after epidural placed  BP 120/63, P 72 FHR 115-120, +accels, no decels Ctx spaced out q 5-8 mins Cx 2-3/100/vtx 0  IUP@term  SROM Latent phase  Will start Pit for augmentation Anticipate SVD Plan to check cx in a few hours or sooner prn  Arabella Merles CNM 04/01/2019

## 2019-04-01 NOTE — MAU Note (Signed)
Pt woke up and had some leaking fluid. Having some ctx, they are not consistent but getting worse.

## 2019-04-01 NOTE — Telephone Encounter (Signed)
Preadmission screen  

## 2019-04-01 NOTE — H&P (Signed)
Diane Foley is a 33 y.o. female G1 @ 40.1wks by 21wk scan presenting for leaking fluid since 1300. Having ctx since then. Denies bleeding, H/A, N/V or visual disturbances. 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 [redacted] weeks gestation of pregnancy 11/20/2018  . Anxiety   . Bipolar 1 disorder (HCC)    bipolar  . Cocaine abuse (HCC)   . Depression   . Drug abuse (HCC)   . Opioid abuse (HCC)   . PTSD (post-traumatic stress disorder)   . Sepsis(995.91)   . Suicidal ideations   . UTI (urinary tract infection)    Past Surgical History:  Procedure Laterality Date  . APPENDECTOMY     Family History: family history includes Alcohol abuse in her father and maternal grandfather; Anxiety disorder in her father and mother; Cancer in her mother; Depression in her father and mother; Diabetes in her paternal grandfather; Hypertension in her father. Social History:  reports that she has been smoking cigarettes. She has been smoking about 0.25 packs per day. She has never used smokeless tobacco. She reports previous alcohol use of about 1.0 standard drinks of alcohol per week. She reports previous drug use. Drugs: Heroin, IV, Cocaine, and Other-see comments.     Maternal Diabetes: No Genetic Screening: Declined Maternal Ultrasounds/Referrals: Normal Fetal Ultrasounds or other Referrals:  None Maternal Substance Abuse:  Yes:  Type: Cocaine, Methadone Significant Maternal Medications:  None Significant Maternal Lab Results:  Lab values include: Group B  Strep negative, Other: CF carrier; chronic Hep C Other Comments:  incarcerated for a portion of the pregnancy  ROS History   Blood pressure 126/74, pulse 76, temperature 97.6 F (36.4 C), temperature source Oral, resp. rate 18, height 5\' 3"  (1.6 m), weight 64.4 kg, SpO2 99 %. Exam Physical Exam  Constitutional: She is oriented to person, place, and time. She appears well-developed.  HENT:  Head: Normocephalic.  Neck: Normal range of motion.  Cardiovascular: Normal rate.  Respiratory: Effort normal.  GI:  EFM 115-120, +accels, no decels, Cat 1 Ctx q 2-5 mins  Musculoskeletal: Normal range of motion.  Neurological: She is alert and oriented to person, place, and time.  Skin: Skin is warm and dry.  Psychiatric: She has a normal mood and affect. Her behavior is normal. Thought content normal.    UDS negative today  Prenatal labs: ABO, Rh: O/Positive/-- (02/25 0830) Antibody: Negative (02/25 0830) Rubella: 1.30 (01/27 1006) RPR: Non Reactive (01/27 1006)  HBsAg: Negative (01/27 1006)  HIV: Non Reactive (01/27 1006)  GBS:   PCR neg (04/01/19)  Assessment/Plan: IUP@term  SROM Methadone use GBS neg Chronic Hep C  Admit to [employer] [facility] management for now Continue maintenance dose of methadone  SW eval pp  Arabella Merles CNM 04/01/2019,  3:38 PM

## 2019-04-01 NOTE — Anesthesia Preprocedure Evaluation (Signed)
Anesthesia Evaluation  Patient identified by MRN, date of birth, ID band Patient awake    Reviewed: Allergy & Precautions, NPO status , Patient's Chart, lab work & pertinent test results  Airway Mallampati: II  TM Distance: >3 FB Neck ROM: Full    Dental no notable dental hx.    Pulmonary neg pulmonary ROS, Current Smoker,    Pulmonary exam normal breath sounds clear to auscultation       Cardiovascular negative cardio ROS Normal cardiovascular exam Rhythm:Regular Rate:Normal     Neuro/Psych negative neurological ROS  negative psych ROS   GI/Hepatic negative GI ROS, Neg liver ROS,   Endo/Other  negative endocrine ROS  Renal/GU negative Renal ROS  negative genitourinary   Musculoskeletal negative musculoskeletal ROS (+)   Abdominal   Peds negative pediatric ROS (+)  Hematology negative hematology ROS (+)   Anesthesia Other Findings   Reproductive/Obstetrics (+) Pregnancy                             Anesthesia Physical Anesthesia Plan  ASA: II  Anesthesia Plan: Epidural   Post-op Pain Management:    Induction:   PONV Risk Score and Plan:   Airway Management Planned:   Additional Equipment:   Intra-op Plan:   Post-operative Plan:   Informed Consent:   Plan Discussed with:   Anesthesia Plan Comments:         Anesthesia Quick Evaluation  

## 2019-04-01 NOTE — Anesthesia Procedure Notes (Signed)
Epidural Patient location during procedure: OB Start time: 04/01/2019 6:34 PM End time: 04/01/2019 6:51 PM  Staffing Anesthesiologist: Lowella Curb, MD Performed: anesthesiologist   Preanesthetic Checklist Completed: patient identified, site marked, surgical consent, pre-op evaluation, timeout performed, IV checked, risks and benefits discussed and monitors and equipment checked  Epidural Patient position: sitting Prep: ChloraPrep Patient monitoring: heart rate, cardiac monitor, continuous pulse ox and blood pressure Approach: midline Location: L2-L3 Injection technique: LOR saline  Needle:  Needle type: Tuohy  Needle gauge: 17 G Needle length: 9 cm Needle insertion depth: 5 cm Catheter type: closed end flexible Catheter size: 20 Guage Catheter at skin depth: 9 cm Test dose: negative  Assessment Events: blood not aspirated, injection not painful, no injection resistance, negative IV test and no paresthesia  Additional Notes Reason for block:procedure for pain

## 2019-04-02 DIAGNOSIS — O9842 Viral hepatitis complicating childbirth: Secondary | ICD-10-CM

## 2019-04-02 DIAGNOSIS — Z3A40 40 weeks gestation of pregnancy: Secondary | ICD-10-CM

## 2019-04-02 DIAGNOSIS — B1920 Unspecified viral hepatitis C without hepatic coma: Secondary | ICD-10-CM

## 2019-04-02 DIAGNOSIS — O99324 Drug use complicating childbirth: Secondary | ICD-10-CM

## 2019-04-02 DIAGNOSIS — F1410 Cocaine abuse, uncomplicated: Secondary | ICD-10-CM

## 2019-04-02 LAB — ABO/RH: ABO/RH(D): O POS

## 2019-04-02 LAB — RPR: RPR Ser Ql: NONREACTIVE

## 2019-04-02 MED ORDER — FERROUS SULFATE 325 (65 FE) MG PO TABS
325.0000 mg | ORAL_TABLET | Freq: Two times a day (BID) | ORAL | Status: DC
  Administered 2019-04-02 – 2019-04-04 (×4): 325 mg via ORAL
  Filled 2019-04-02 (×5): qty 1

## 2019-04-02 MED ORDER — DIBUCAINE (PERIANAL) 1 % EX OINT: 1.0000 "application " | TOPICAL_OINTMENT | CUTANEOUS | Status: DC | PRN

## 2019-04-02 MED ORDER — ONDANSETRON HCL 4 MG PO TABS: 4.0000 mg | ORAL_TABLET | ORAL | Status: DC | PRN

## 2019-04-02 MED ORDER — ZOLPIDEM TARTRATE 5 MG PO TABS
5.0000 mg | ORAL_TABLET | Freq: Every evening | ORAL | Status: DC | PRN
  Administered 2019-04-02: 5 mg via ORAL
  Filled 2019-04-02: qty 1

## 2019-04-02 MED ORDER — PRENATAL MULTIVITAMIN CH
1.0000 | ORAL_TABLET | Freq: Every day | ORAL | Status: DC
  Administered 2019-04-02 – 2019-04-04 (×3): 1 via ORAL
  Filled 2019-04-02 (×3): qty 1

## 2019-04-02 MED ORDER — IBUPROFEN 600 MG PO TABS
600.0000 mg | ORAL_TABLET | Freq: Four times a day (QID) | ORAL | Status: DC
  Administered 2019-04-02 – 2019-04-04 (×9): 600 mg via ORAL
  Filled 2019-04-02 (×9): qty 1

## 2019-04-02 MED ORDER — METHYLERGONOVINE MALEATE 0.2 MG PO TABS: 0.2000 mg | ORAL_TABLET | ORAL | Status: DC | PRN

## 2019-04-02 MED ORDER — DOCUSATE SODIUM 100 MG PO CAPS
100.0000 mg | ORAL_CAPSULE | Freq: Two times a day (BID) | ORAL | Status: DC
  Administered 2019-04-02 – 2019-04-04 (×4): 100 mg via ORAL
  Filled 2019-04-02 (×4): qty 1

## 2019-04-02 MED ORDER — BENZOCAINE-MENTHOL 20-0.5 % EX AERO
1.0000 "application " | INHALATION_SPRAY | CUTANEOUS | Status: DC | PRN
  Administered 2019-04-02: 1 via TOPICAL
  Filled 2019-04-02: qty 56

## 2019-04-02 MED ORDER — DIPHENHYDRAMINE HCL 25 MG PO CAPS
25.0000 mg | ORAL_CAPSULE | Freq: Four times a day (QID) | ORAL | Status: DC | PRN
  Administered 2019-04-03: 25 mg via ORAL
  Filled 2019-04-02: qty 1

## 2019-04-02 MED ORDER — METHYLERGONOVINE MALEATE 0.2 MG/ML IJ SOLN: 0.2000 mg | INTRAMUSCULAR | Status: DC | PRN

## 2019-04-02 MED ORDER — BISACODYL 10 MG RE SUPP: 10.0000 mg | Freq: Every day | RECTAL | Status: DC | PRN

## 2019-04-02 MED ORDER — FLEET ENEMA 7-19 GM/118ML RE ENEM: 1.0000 | ENEMA | Freq: Every day | RECTAL | Status: DC | PRN

## 2019-04-02 MED ORDER — COCONUT OIL OIL: 1.0000 "application " | TOPICAL_OIL | Status: DC | PRN

## 2019-04-02 MED ORDER — ONDANSETRON HCL 4 MG/2ML IJ SOLN: 4.0000 mg | INTRAMUSCULAR | Status: DC | PRN

## 2019-04-02 MED ORDER — MEASLES, MUMPS & RUBELLA VAC IJ SOLR: 0.5000 mL | Freq: Once | INTRAMUSCULAR | Status: DC

## 2019-04-02 MED ORDER — SIMETHICONE 80 MG PO CHEW: 80.0000 mg | CHEWABLE_TABLET | ORAL | Status: DC | PRN

## 2019-04-02 MED ORDER — WITCH HAZEL-GLYCERIN EX PADS: 1.0000 "application " | MEDICATED_PAD | CUTANEOUS | Status: DC | PRN

## 2019-04-02 MED ORDER — TETANUS-DIPHTH-ACELL PERTUSSIS 5-2.5-18.5 LF-MCG/0.5 IM SUSP: 0.5000 mL | Freq: Once | INTRAMUSCULAR | Status: DC

## 2019-04-02 NOTE — Discharge Summary (Signed)
Postpartum Discharge Summary     Patient Name: Diane Foley DOB: 08/27/1986 MRN: [id number]  Date of admission: 04/01/2019 Delivering Provider: Jacklyn ShellRESENZO-DISHMON, FRANCES   Date of discharge: 04/02/2019  Admitting diagnosis: WATER BROKE Intrauterine pregnancy: 5873w2d     Secondary diagnosis:  Active Problems:   Uncomplicated opioid dependence (HCC)   Hepatitis C antibody test positive   Cystic fibrosis carrier, antepartum   Amniotic fluid leaking   Cocaine abuse (HCC)  Additional problems: incarceration during some of the pregnancy     Discharge diagnosis: Term Pregnancy Delivered                                                                                                Post partum procedures:N/A  Augmentation: Pitocin  Complications: None  Hospital course:  Onset of Labor With Vaginal Delivery     33 y.o. yo G1P0000 at 3973w2d was admitted in Latent Labor on 04/01/2019. Patient had an uncomplicated labor course as follows:  Membrane Rupture Time/Date: 1:00 PM ,04/01/2019  Augmented with Pitocin, pushed about an hour. Taking Methadone 70mg  q am.  Intrapartum Procedures: Episiotomy: None [1]                                         Lacerations:  None [1]  Patient had a delivery of a Viable infant. 04/02/2019  Information for the patient's newborn:  [place] [facility], Girl Rindi [[id number]][[id number]]  Delivery Method: Vaginal, Spontaneous(Filed from Delivery Summary)    Pateint had an uncomplicated postpartum course.  She is ambulating, tolerating a regular diet, passing flatus, and urinating well. Patient is discharged home in stable condition on 04/02/19.   Magnesium Sulfate recieved: No BMZ received: No  Physical exam  Vitals:   04/02/19 0900 04/02/19 1000 04/02/19 1015 04/02/19 1030  BP: 139/65 135/73 (!) 117/55 118/72  Pulse: (!) 107 (!) 107 94 96  Resp: 20 18 18 16   Temp:      TempSrc:      SpO2:      Weight:      Height:       General: alert, cooperative and no  distress Lochia: appropriate Uterine Fundus: firm Incision: N/A DVT Evaluation: No evidence of DVT seen on physical exam. Negative Homan's sign. Labs: Lab Results  Component Value Date   WBC 9.4 04/01/2019   HGB 11.9 (L) 04/01/2019   HCT 34.9 (L) 04/01/2019   MCV 91.4 04/01/2019   PLT 231 04/01/2019   CMP Latest Ref Rng & Units 11/22/2018  Glucose 70 - 99 mg/dL 220(U102(H)  BUN 6 - 20 mg/dL 5(L)  Creatinine 5.420.44 - 1.00 mg/dL 7.06(C0.42(L)  Sodium 376135 - 283145 mmol/L 137  Potassium 3.5 - 5.1 mmol/L 4.1  Chloride 98 - 111 mmol/L 111  CO2 22 - 32 mmol/L 16(L)  Calcium 8.9 - 10.3 mg/dL 7.9(L)  Total Protein 6.5 - 8.1 g/dL -  Total Bilirubin 0.3 - 1.2 mg/dL -  Alkaline Phos 38 - 151126 U/L -  AST 15 - 41 U/L -  ALT 0 - 44 U/L -    Discharge instruction: per After Visit Summary and "Baby and Me Booklet".  After visit meds:  Allergies as of 04/04/2019   No Known Allergies     Medication List    TAKE these medications   benzocaine-Menthol 20-0.5 % Aero Commonly known as:  DERMOPLAST Apply 1 application topically as needed for irritation (perineal discomfort).   ibuprofen 800 MG tablet Commonly known as:  ADVIL Take 1 tablet (800 mg total) by mouth every 8 (eight) hours as needed.   methadone 10 MG tablet Commonly known as:  DOLOPHINE Take 7 tablets (70 mg total) by mouth daily.   prenatal vitamin w/FE, FA 27-1 MG Tabs tablet Take 1 tablet by mouth daily at 12 noon.   PRESCRIPTION MEDICATION 1 tablet daily. For reflux, not sure of the name.   terconazole 0.4 % vaginal cream Commonly known as:  TERAZOL 7 Place 1 applicator vaginally at bedtime.       Diet: routine diet  Activity: Advance as tolerated. Pelvic rest for 6 weeks.   Outpatient follow up:6 weeks   Follow up Appt: No future appointments.   Follow up Visit: Please schedule this patient for Postpartum visit in: 6 weeks with the following provider: Any provider For C/S patients schedule nurse incision check in  weeks 2 weeks:  High risk pregnancy complicated by: Hep C, cocaine/opiate abuse; methadone Delivery mode:  SVD Anticipated Birth Control:  IUD PP Procedures needed:   Schedule Integrated BH visit: yes  Newborn Data: Live born female  Birth Weight:   APGAR: 9, 9  Newborn Delivery   Birth date/time:  04/02/2019 09:57:00 Delivery type:  Vaginal, Spontaneous    Baby Feeding: Bottle Disposition:rooming in pending formal approval from CPS  Clayton Bibles, CNM 04/04/19  3:40 PM

## 2019-04-02 NOTE — Progress Notes (Signed)
Patient ID: Diane Foley, female   DOB: 1986-01-20, 33 y.o.   MRN: [id number]  Comfortable w/ epidural now, but has needed to use PCA button  BP 119/72, P 74 FHR 130s, +accels, no decels Ctx q 2-5 mins with Pit at 73mu/min Cx 8/80/vtx 0; lots of hard stool in rectum  IUP@term  Active labor/transition  Continue to titrate Pit to keep reg ctx Plan to check cx in 2 hrs or sooner with urge to push Anticipate SVD  Diane Foley [facility] 04/02/2019 3:09 AM

## 2019-04-02 NOTE — Progress Notes (Signed)
Vitals:   04/02/19 0800 04/02/19 0830  BP: 132/83 (!) 142/89  Pulse: 93 94  Resp: 16 18  Temp: 98.5 F (36.9 C)   SpO2:     Pt now c/c/+2.  No pressure but wants to push.  FHR Cat 1.  Will start pushing.

## 2019-04-02 NOTE — Anesthesia Postprocedure Evaluation (Signed)
Anesthesia Post Note  Patient: [name]  Procedure(s) Performed: AN AD HOC LINE PLACEMENT     Patient location during evaluation: Mother Baby Anesthesia Type: Epidural Level of consciousness: awake and alert Pain management: pain level controlled Vital Signs Assessment: post-procedure vital signs reviewed and stable Respiratory status: spontaneous breathing and nonlabored ventilation Cardiovascular status: blood pressure returned to baseline and stable Postop Assessment: no headache, no backache and epidural receding Anesthetic complications: no    Last Vitals:  Vitals:   04/02/19 1155 04/02/19 1255  BP: 117/76 121/77  Pulse: 79 90  Resp: 16 16  Temp: 37 C 36.6 C  SpO2: 99% 100%    Last Pain:  Vitals:   04/02/19 1255  TempSrc: Axillary  PainSc: 2    Pain Goal:                   Diane Foley

## 2019-04-02 NOTE — Addendum Note (Signed)
Addendum  created 04/02/19 1433 by Marny Lowenstein, CRNA   Charge Capture section accepted, Clinical Note Signed

## 2019-04-02 NOTE — Progress Notes (Signed)
Patient ID: Diane Foley, female   DOB: Mar 22, 1986, 33 y.o.   MRN: [id number]  IV has infiltrated, mostly likely occurring since around 0530; pt sleepy now- rec'd daily methadone; awaiting IV team to come restart IV  BP 99/77, P 93 FHR 125, 10x10accels, no decels Ctx spaced out more now- q 5-7 mins without Pitocin Cx deferred  IUP@40 .2wks Protracted active phase due to IV infiltration/no Pit Methadone use  Once IV and Pitocin restarted, plan to check cx after ctx are more regular  Diane Foley [facility] 04/02/2019 7:38 AM

## 2019-04-03 NOTE — Progress Notes (Signed)
Post Partum Day 1 Subjective: no complaints, up ad lib, voiding and tolerating PO, small lochia, plans to breastfeed, plans to bottle feed, IUD  Objective: Blood pressure 100/62, pulse 74, temperature 97.6 F (36.4 C), temperature source Oral, resp. rate 18, height 5\' 3"  (1.6 m), weight 64.4 kg, SpO2 97 %.  Physical Exam:  General: alert, cooperative and no distress Lochia:normal flow Chest: CTAB Heart: RRR no m/r/g Abdomen: +BS, soft, nontender,  Uterine Fundus: firm DVT Evaluation: No evidence of DVT seen on physical exam. Extremities: no edema  Recent Labs    04/01/19 1704  HGB 11.9*  HCT 34.9*    Assessment/Plan: Social Work consult  DC to rooming in tomorrow (baby most likely stay d/t methadone)   LOS: 2 days   Jacklyn Shell 04/03/2019, 7:11 AM

## 2019-04-03 NOTE — Progress Notes (Signed)
Upon entering room multiple times this morning patient noted to ne nodding off, unable to maintain focus to carry on conversation and has to be repeatedly nudged to remind her to take her medication. Patient admits hse is having a hard time staying awake but states she slept well.

## 2019-04-03 NOTE — Clinical Social Work Maternal (Signed)
CLINICAL SOCIAL WORK MATERNAL/CHILD NOTE  Patient Details  Name: Diane Foley MRN: [id number] Date of Birth: 08/08/1986  Date:  04/03/2019  Clinical Social Worker Initiating Note:  Diane Foley Date/Time: Initiated:  04/03/19/1014     Child's Name:  Diane Foley   Biological Parents:  Mother(MOB unsure of who FOB is)   Need for Interpreter:  None   Reason for Referral:  Current Substance Use/Substance Use During Pregnancy , Behavioral Health Concerns   Address:  [address] [place] [address]    Phone number:  [phone] (home)     Additional phone number:   Household Members/Support Persons (HM/SP):   Household Member/Support Person 1   HM/SP Name Relationship DOB or Age  HM/SP -1 Diane Foley MOB's mother    HM/SP -2        HM/SP -3        HM/SP -4        HM/SP -5        HM/SP -6        HM/SP -7        HM/SP -8          Natural Supports (not living in the home):  Extended Family(MOB has an aunt and cousin that MOB reports are very supportive)   Professional Supports:     Employment: Unemployed   Type of Work:     Education:  High school graduate   Homebound arranged:    Financial Resources:  Medicaid   Other Resources:  WIC, Food Stamps    Cultural/Religious Considerations Which May Impact Care:   Strengths:  Ability to meet basic needs , Home prepared for child    Psychotropic Medications:         Pediatrician:       Pediatrician List:   [place]    [place]    [place]    [place]    [place]    [place]      Pediatrician Fax Number:    Risk Factors/Current Problems:  Mental Health Concerns , Substance Use    Cognitive State:  Able to Concentrate , Linear Thinking , Alert    Mood/Affect:  Calm , Comfortable , Happy , Interested , Relaxed    CSW Assessment: CSW received consult for history of anxiety, depression, bipolar, substance use during pregnancy and  incarceration during pregnancy. CSW met with MOB to offer support and complete assessment.    MOB propped up in bed talking on the phone with infant asleep in basinet and MGM present at bedside, when CSW entered the room. CSW offered to come back at a later time but MOB welcoming to CSW visit. CSW introduced self and role and with MOB's verbal permission asked that MGM step out during assessment. MGM left voluntarily. CSW explained reason for consult due to MOB's mental health and substance use history. MOB nodded in understanding. MOB pleasant and easy to engage throughout assessment. MOB appropriate and attentive to infant during CSW visit. CSW did note that MOB seemed tired but did not have difficulty staying awake during assessment. MOB reported she currently lives with MGM in [place]. MOB stated her highest level of education completed was high school diploma and reported that she is not currently employed. MOB confirmed she receives both WIC and Food Stamps and is aware of process to get infant added on to her plan.   CSW inquired about MOB's mental health history and MOB acknowledged having a history of bipolar   disorder, manic depressive disorder and PTSD. MOB stated she is not currently on medications but did note that she intends to follow up once settled at home to restart her medications. MOB informed CSW that she has been clean since December and reported she was using cocaine and heroin. MOB stated she was arrested in December due to a probation violation and was in jail for a month and a half. MOB noted that she did not currently have anything pending and that she "did her time". MOB reported she relapsed when she got out of jail in February but has not used since. MOB noted that she is currently on methadone that is prescribed through [facility]. MOB stated she goes daily to pick up the methadone. MOB informed CSW that she also receives substance use counseling through  [facility]. MOB open to additional counseling resources for mental health follow up. CSW provided MOB with a list of various providers. CSW also provided education regarding the baby blues period vs. perinatal mood disorders, discussed treatment and gave resources for mental health follow up if concerns arise.  CSW recommends self-evaluation during the postpartum time period using the New Mom Checklist from Postpartum Progress and encouraged MOB to contact a medical professional if symptoms are noted at any time. MOB denied any current mental health symptoms and appeared to be in good-spirits. MOB denied any SI, HI or DV and reported feeling well-supported by her mother, aunt and cousin. CSW informed MOB of Hospital Drug Policy due to MOB's substance use during pregnancy and explained UDS and CDS were still pending. MOB denied any questions or concerns regarding policy. CSW explained that because MOB tested positive for cocaine and opiates in December, that a CPS report would have to be made. MOB expressed understanding and asked appropriate questions.   MOB confirmed having all essential items for infant once discharged. MOB stated infant would sleep in a crib and/or pack 'n' play once home. CSW provided review of Sudden Infant Death Syndrome (SIDS) precautions and safe sleeping habits.    CSW called and spoke with Wes, [place] CPS Intake, to make CPS report due to MOB's substance use during pregnancy. CSW awaiting CPS disposition.    CSW Plan/Description:  Sudden Infant Death Syndrome (SIDS) Education, Perinatal Mood and Anxiety Disorder (PMADs) Education, Hospital Drug Screen Policy Information, Child Protective Service Report , CSW Will Continue to Monitor Umbilical Cord Tissue Drug Screen Results and Make Report if Warranted, CSW Awaiting CPS Disposition Plan    Diane Dise  Foley, LCSWA 04/03/2019, 11:02 AM 

## 2019-04-04 ENCOUNTER — Encounter: Payer: Self-pay | Admitting: Obstetrics and Gynecology

## 2019-04-04 ENCOUNTER — Other Ambulatory Visit: Payer: Self-pay

## 2019-04-04 MED ORDER — BENZOCAINE-MENTHOL 20-0.5 % EX AERO: 1.0000 "application " | INHALATION_SPRAY | CUTANEOUS | 0 refills | Status: DC | PRN

## 2019-04-04 MED ORDER — IBUPROFEN 800 MG PO TABS: 800.0000 mg | ORAL_TABLET | Freq: Three times a day (TID) | ORAL | 0 refills | Status: DC | PRN

## 2019-04-04 NOTE — Discharge Instructions (Signed)

## 2019-04-07 ENCOUNTER — Inpatient Hospital Stay (HOSPITAL_COMMUNITY): Payer: Medicaid Other

## 2019-04-14 NOTE — Addendum Note (Signed)
Addendum  created 04/14/19 1000 by Fabienne Bruns, RN   Procedure Navigator section edited

## 2019-05-11 ENCOUNTER — Telehealth: Payer: Self-pay | Admitting: Nurse Practitioner

## 2019-05-11 NOTE — Telephone Encounter (Signed)
Called the patient to confirm appointment change times due to face to face and virtual providers. The patient stated she was unware of the appointment and does think she can make it. Rescheduled.

## 2019-05-12 ENCOUNTER — Ambulatory Visit: Payer: Medicaid Other | Admitting: Obstetrics & Gynecology

## 2019-05-12 ENCOUNTER — Ambulatory Visit: Payer: Medicaid Other | Admitting: Obstetrics and Gynecology

## 2019-05-18 ENCOUNTER — Encounter

## 2019-05-30 ENCOUNTER — Encounter: Payer: Self-pay | Admitting: Family Medicine

## 2019-05-30 ENCOUNTER — Other Ambulatory Visit: Payer: Self-pay

## 2019-05-30 ENCOUNTER — Ambulatory Visit (INDEPENDENT_AMBULATORY_CARE_PROVIDER_SITE_OTHER): Payer: Medicaid Other | Admitting: Family Medicine

## 2019-05-30 DIAGNOSIS — Z1389 Encounter for screening for other disorder: Secondary | ICD-10-CM | POA: Diagnosis not present

## 2019-05-30 DIAGNOSIS — F1120 Opioid dependence, uncomplicated: Secondary | ICD-10-CM

## 2019-05-30 NOTE — Progress Notes (Signed)
Subjective:     Diane Foley is a 33 y.o. female who presents for a postpartum visit. She is 8 weeks postpartum following a spontaneous vaginal delivery. I have fully reviewed the prenatal and intrapartum course. The delivery was at 107w2d gestational weeks. Outcome: spontaneous vaginal delivery. Anesthesia: epidural. Postpartum course has been uncomplicated. Baby's course has been complicated by a NICU stay. Baby is feeding by bottle - Marcos Eke. Bleeding no bleeding. Bowel function is normal. Bladder function is normal. Patient is sexually active. Contraception method is using condoms right now, considering IUD but unsure. Postpartum depression screening: negative.  The following portions of the patient's history were reviewed and updated as appropriate: allergies, current medications, past family history, past medical history, past social history, past surgical history and problem list.  Review of Systems Pertinent items are noted in HPI.   Objective:      General:  alert, cooperative and no distress  Lungs: clear to auscultation bilaterally  Heart:  regular rate and rhythm, S1, S2 normal, no murmur, click, rub or gallop  Abdomen: soft, non-tender; bowel sounds normal; no masses,  no organomegaly        Assessment:     Normal postpartum exam. Pap smear not done at today's visit.   Plan:    1. Contraception: condoms 2. Follow up in: 1 year or as needed.

## 2021-11-06 ENCOUNTER — Ambulatory Visit (HOSPITAL_COMMUNITY)
Admission: RE | Admit: 2021-11-06 | Discharge: 2021-11-06 | Disposition: A | Discharge status: Home / Self Care | Payer: Medicaid Other | Attending: Psychiatry | Admitting: Psychiatry

## 2021-11-06 ENCOUNTER — Other Ambulatory Visit (HOSPITAL_COMMUNITY)
Admission: EM | Admit: 2021-11-06 | Discharge: 2021-11-11 | Disposition: A | Discharge status: Home / Self Care | Payer: Medicaid Other | Attending: Psychiatry | Admitting: Psychiatry

## 2021-11-06 ENCOUNTER — Emergency Department (HOSPITAL_COMMUNITY)
Admission: EM | Admit: 2021-11-06 | Discharge: 2021-11-06 | Disposition: A | Discharge status: Other Acute Inpatient Hospital | Payer: Medicaid Other | Attending: Emergency Medicine | Admitting: Emergency Medicine

## 2021-11-06 DIAGNOSIS — F1410 Cocaine abuse, uncomplicated: Secondary | ICD-10-CM | POA: Diagnosis not present

## 2021-11-06 DIAGNOSIS — Z20822 Contact with and (suspected) exposure to covid-19: Secondary | ICD-10-CM | POA: Insufficient documentation

## 2021-11-06 DIAGNOSIS — Y900 Blood alcohol level of less than 20 mg/100 ml: Secondary | ICD-10-CM | POA: Insufficient documentation

## 2021-11-06 DIAGNOSIS — F17210 Nicotine dependence, cigarettes, uncomplicated: Secondary | ICD-10-CM | POA: Insufficient documentation

## 2021-11-06 DIAGNOSIS — N9489 Other specified conditions associated with female genital organs and menstrual cycle: Secondary | ICD-10-CM | POA: Insufficient documentation

## 2021-11-06 DIAGNOSIS — F32A Depression, unspecified: Secondary | ICD-10-CM

## 2021-11-06 DIAGNOSIS — R45851 Suicidal ideations: Secondary | ICD-10-CM | POA: Insufficient documentation

## 2021-11-06 DIAGNOSIS — Z046 Encounter for general psychiatric examination, requested by authority: Secondary | ICD-10-CM | POA: Diagnosis present

## 2021-11-06 DIAGNOSIS — F332 Major depressive disorder, recurrent severe without psychotic features: Secondary | ICD-10-CM | POA: Diagnosis not present

## 2021-11-06 DIAGNOSIS — F4310 Post-traumatic stress disorder, unspecified: Secondary | ICD-10-CM | POA: Diagnosis not present

## 2021-11-06 LAB — RESP PANEL BY RT-PCR (FLU A&B, COVID) ARPGX2
Influenza A by PCR: NEGATIVE
Influenza B by PCR: NEGATIVE
SARS Coronavirus 2 by RT PCR: NEGATIVE

## 2021-11-06 LAB — CBC WITH DIFFERENTIAL/PLATELET
Abs Immature Granulocytes: 0.01 10*3/uL (ref 0.00–0.07)
Basophils Absolute: 0 10*3/uL (ref 0.0–0.1)
Basophils Relative: 0 %
Eosinophils Absolute: 0.1 10*3/uL (ref 0.0–0.5)
Eosinophils Relative: 3 %
HCT: 31.7 % — ABNORMAL LOW (ref 36.0–46.0)
Hemoglobin: 9.9 g/dL — ABNORMAL LOW (ref 12.0–15.0)
Immature Granulocytes: 0 %
Lymphocytes Relative: 27 %
Lymphs Abs: 1.3 10*3/uL (ref 0.7–4.0)
MCH: 25.3 pg — ABNORMAL LOW (ref 26.0–34.0)
MCHC: 31.2 g/dL (ref 30.0–36.0)
MCV: 81.1 fL (ref 80.0–100.0)
Monocytes Absolute: 0.4 10*3/uL (ref 0.1–1.0)
Monocytes Relative: 8 %
Neutro Abs: 3.1 10*3/uL (ref 1.7–7.7)
Neutrophils Relative %: 62 %
Platelets: 354 10*3/uL (ref 150–400)
RBC: 3.91 MIL/uL (ref 3.87–5.11)
RDW: 16.7 % — ABNORMAL HIGH (ref 11.5–15.5)
WBC: 5 10*3/uL (ref 4.0–10.5)
nRBC: 0 % (ref 0.0–0.2)

## 2021-11-06 LAB — COMPREHENSIVE METABOLIC PANEL
ALT: 41 U/L (ref 0–44)
AST: 38 U/L (ref 15–41)
Albumin: 3.6 g/dL (ref 3.5–5.0)
Alkaline Phosphatase: 65 U/L (ref 38–126)
Anion gap: 10 (ref 5–15)
BUN: 8 mg/dL (ref 6–20)
CO2: 28 mmol/L (ref 22–32)
Calcium: 8.9 mg/dL (ref 8.9–10.3)
Chloride: 99 mmol/L (ref 98–111)
Creatinine, Ser: 0.62 mg/dL (ref 0.44–1.00)
GFR, Estimated: >60 mL/min (ref >60)
Glucose, Bld: 102 mg/dL — ABNORMAL HIGH (ref 70–99)
Potassium: 3.2 mmol/L — ABNORMAL LOW (ref 3.5–5.1)
Sodium: 137 mmol/L (ref 135–145)
Total Bilirubin: 0.3 mg/dL (ref 0.3–1.2)
Total Protein: 7 g/dL (ref 6.5–8.1)

## 2021-11-06 LAB — ACETAMINOPHEN LEVEL: Acetaminophen (Tylenol), Serum: <10 ug/mL — ABNORMAL LOW (ref 10–30)

## 2021-11-06 LAB — SALICYLATE LEVEL: Salicylate Lvl: <7 mg/dL — ABNORMAL LOW (ref 7.0–30.0)

## 2021-11-06 LAB — I-STAT BETA HCG BLOOD, ED (MC, WL, AP ONLY): I-stat hCG, quantitative: <5 m[IU]/mL (ref <5)

## 2021-11-06 LAB — ETHANOL: Alcohol, Ethyl (B): <10 mg/dL (ref <10)

## 2021-11-06 MED ORDER — ACETAMINOPHEN 325 MG PO TABS
650.0000 mg | ORAL_TABLET | Freq: Four times a day (QID) | ORAL | Status: DC | PRN
  Filled 2021-11-06: qty 2

## 2021-11-06 MED ORDER — TRAZODONE HCL 100 MG PO TABS: 100.0000 mg | ORAL_TABLET | Freq: Every day | ORAL | Status: DC

## 2021-11-06 MED ORDER — HYDROXYZINE HCL 25 MG PO TABS
25.0000 mg | ORAL_TABLET | Freq: Three times a day (TID) | ORAL | Status: DC | PRN
  Administered 2021-11-10: 21:00:00 25 mg via ORAL
  Filled 2021-11-06 (×2): qty 1

## 2021-11-06 MED ORDER — ALUM & MAG HYDROXIDE-SIMETH 200-200-20 MG/5ML PO SUSP: 30.0000 mL | ORAL | Status: DC | PRN

## 2021-11-06 MED ORDER — ESCITALOPRAM OXALATE 10 MG PO TABS
10.0000 mg | ORAL_TABLET | Freq: Every day | ORAL | Status: DC
  Administered 2021-11-07: 10 mg via ORAL
  Filled 2021-11-06 (×2): qty 1

## 2021-11-06 MED ORDER — TRAZODONE HCL 100 MG PO TABS
100.0000 mg | ORAL_TABLET | Freq: Every day | ORAL | Status: DC
  Administered 2021-11-06 – 2021-11-10 (×5): 100 mg via ORAL
  Filled 2021-11-06: qty 1
  Filled 2021-11-06: qty 7
  Filled 2021-11-06 (×4): qty 1

## 2021-11-06 MED ORDER — MAGNESIUM HYDROXIDE 400 MG/5ML PO SUSP: 30.0000 mL | Freq: Every day | ORAL | Status: DC | PRN

## 2021-11-06 MED ORDER — METHADONE HCL 10 MG/ML PO CONC: 55.0000 mg | Freq: Every day | ORAL | Status: DC
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
## 2021-11-06 NOTE — BH Assessment (Signed)
[facility] Assessment Progress Note   Per Elta Guadeloupe, NP, this pt would benefit from inpatient behavioral health treatment at this time.  Earlene Plater, [facility] has tentatively accepted pt to [facility] in anticipation of negative Covid result and other medical clearance.  Pt has signed Voluntary Admission and Consent for Treatment, as well as Consent to Release Information to [facility], and signed forms have been faxed to [facility].  EDP Vanetta Mulders, MD and pt's nurse, Waynetta Sandy, have been notified, and Waynetta Sandy agrees to send original paperwork along with pt via [employer], and to call report to [phone] when the time comes.  Doylene Canning, [place] Behavioral Health Coordinator [phone]

## 2021-11-06 NOTE — ED Notes (Signed)
Coming from [facility] they currently have no beds-sending to ED for "medical clearance"

## 2021-11-06 NOTE — ED Notes (Signed)
Patient off unit to [facility] per provider. Patient alert and cooperative, no s/s of distress. DC information and belongings given to [employer] for facility. Patient ambulatory off unit. Patient transported by [employer]
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
## 2021-11-06 NOTE — ED Notes (Signed)
Pt asleep in bed. Respirations even and unlabored. Will continue to monitor for safety. ?

## 2021-11-06 NOTE — ED Notes (Signed)
Pt has been brought on the unit and familiarized with the unit

## 2021-11-06 NOTE — ED Provider Notes (Signed)
[facility]-EMERGENC[facility] DEPT Provider Note   CSN: [id number] Arrival date & time: 11/06/21  1314     History Chief Complaint  Patient presents with   Suicidal    Diane Foley is a 35 y.o. female who presents from behavioral health after evaluation by psychiatric NP.  Patient with depression and suicidality, having some relational challenges with her children's father.  States she has been clean from drugs for 3 years but over the weekend went on a 3-day bender with heroin and crack cocaine.  Has not used in 3 days.  Last dose of methadone received this morning.  Per review of patient's NP BH H notes this morning, she does not meet inpatient criteria for psychiatric admission.  Unfortunately [facility] is without any beds available at this time.  Was transferred to [facility] for medical clearance and pending psychiatric placement.  I personally reviewed this patient's medical records.  She is history of hepatitis C, polysubstance abuse, depression.  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   Cocaine abuse (HCC) 05/20/2018   Hepatitis C antibody test positive 12/23/2017   Polysubstance abuse (HCC) 12/20/2017   Uncomplicated opioid dependence (HCC) 02/16/2015   MDD (major depressive disorder), severe (HCC) 02/11/2015    Past Surgical History:  Procedure Laterality Date   APPENDECTOMY       OB History     Gravida  1   Para  0   Term  0   Preterm  0   AB  0   Living  0      SAB  0   IAB  0   Ectopic  0   Multiple  0   Live Births  0            Family History  Problem Relation Age of Onset   Cancer Mother    Anxiety disorder Mother    Depression Mother    Hypertension Father    Anxiety disorder Father    Depression Father    Alcohol abuse Father    Alcohol abuse Maternal Grandfather    Diabetes Paternal Grandfather     Social History   Tobacco Use   Smoking status: Every Day    Packs/day: 0.25    Types: Cigarettes   Smokeless tobacco: Never  Vaping Use   Vaping Use: Never used  Substance Use Topics   Alcohol use: Not Currently    Alcohol/week: 1.0 standard drink    Types: 1 Cans of beer per week    Comment: occasionally   Drug use: Not Currently    Types: Heroin, IV, Cocaine, Other-see comments    Comment: last used in december 2019    Home Medications Prior to Admission medications   Medication Sig Start Date End Date Taking? Authorizing Provider  escitalopram (LEXAPRO) 10 MG tablet Take 10 mg by mouth daily. 10/18/21  Yes [provider]  methadone (DOLOPHINE) 10 MG/ML solution Take by mouth every 8 (eight)  hours. Patient states she takes 55 mg total every day   Yes [provider]  traZODone (DESYREL) 50 MG tablet Take 100 mg by mouth at bedtime. 10/18/21  Yes [provider]  methadone (DOLOPHINE) 10 MG tablet Take 7 tablets (70 mg total) by mouth daily. Patient not taking: Reported on 11/06/2021 12/13/18   Adam Phenix, MD    Allergies    Patient has no known allergies.  Review of Systems   Review of Systems  Constitutional: Negative.   HENT: Negative.    Respiratory: Negative.    Cardiovascular: Negative.   Gastrointestinal: Negative.   Genitourinary: Negative.   Musculoskeletal: Negative.   Skin: Negative.   Neurological: Negative.   Psychiatric/Behavioral:  Positive for decreased concentration, dysphoric mood and suicidal ideas. Negative for hallucinations, self-injury and sleep disturbance. The patient is nervous/anxious.    Physical Exam Updated  Vital Signs BP 117/78 (BP Location: Left Arm)   Pulse 66   Temp (!) 97.5 F (36.4 C) (Axillary)   Resp 16   SpO2 98%   Physical Exam Vitals and nursing note reviewed.  Constitutional:      Appearance: She is not ill-appearing or toxic-appearing.  HENT:     Head: Normocephalic and atraumatic.     Nose: Nose normal.     Mouth/Throat:     Mouth: Mucous membranes are moist.     Pharynx: No oropharyngeal exudate or posterior oropharyngeal erythema.  Eyes:     General:        Right eye: No discharge.        Left eye: No discharge.     Extraocular Movements: Extraocular movements intact.     Conjunctiva/sclera: Conjunctivae normal.     Pupils: Pupils are equal, round, and reactive to light.  Cardiovascular:     Rate and Rhythm: Normal rate and regular rhythm.     Pulses: Normal pulses.     Heart sounds: Normal heart sounds.  Pulmonary:     Effort: Pulmonary effort is normal. No respiratory distress.     Breath sounds: Normal breath sounds. No wheezing or rales.  Abdominal:     General: Bowel sounds are normal. There is no distension.     Palpations: Abdomen is soft.     Tenderness: There is no abdominal tenderness.  Musculoskeletal:        General: No deformity.     Cervical back: Neck supple.  Skin:    General: Skin is warm and dry.     Capillary Refill: Capillary refill takes less than 2 seconds.  Neurological:     General: No focal deficit present.     Mental Status: She is alert and oriented to person, place, and time. Mental status is at baseline.     Gait: Gait is intact.  Psychiatric:        Mood and Affect: Mood is depressed. Affect is flat.        Speech: Speech normal.        Behavior: Behavior is cooperative.        Thought Content: Thought content is not delusional. Thought content includes suicidal ideation. Thought content does not include homicidal ideation. Thought content does not include suicidal plan.     Comments: Does not appear to be responding to  internal stimuli at this time.     ED Results / Procedures / Treatments   Labs (all labs ordered are listed, but only abnormal results are displayed) Labs Reviewed  COMPREHENSIVE METABOLIC PANEL -  Abnormal; Notable for the following components:      Result Value   Potassium 3.2 (*)    Glucose, Bld 102 (*)    All other components within normal limits  CBC WITH DIFFERENTIAL/PLATELET - Abnormal; Notable for the following components:   Hemoglobin 9.9 (*)    HCT 31.7 (*)    MCH 25.3 (*)    RDW 16.7 (*)    All other components within normal limits  SALICYLATE LEVEL - Abnormal; Notable for the following components:   Salicylate Lvl <7.0 (*)    All other components within normal limits  ACETAMINOPHEN LEVEL - Abnormal; Notable for the following components:   Acetaminophen (Tylenol), Serum <10 (*)    All other components within normal limits  RESP PANEL BY RT-PCR (FLU A&B, COVID) ARPGX2  ETHANOL  RAPID URINE DRUG SCREEN, HOSP PERFORMED  I-STAT BETA HCG BLOOD, ED ([facility], [facility], [facility] ONLY)    EKG None  Radiology No results found.  Procedures Procedures   Medications Ordered in ED Medications - No data to display  ED Course  I have reviewed the triage vital signs and the nursing notes.  Pertinent labs & imaging results that were available during my care of the patient were reviewed by me and considered in my medical decision making (see chart for details).    MDM Rules/Calculators/A&P                         35 year old female presents with concern for suicidality, psychiatric team evaluated her this morning and recommend inpatient treatment.  Here for medical clearance.  Vital signs are normal and intact.  Cardiopulmonary developmental clinic, exam is benign.  Patient is neurovascularly intact in all 4 extremities.  Does not appear to be responding to internal stimuli.  CBC with anemia with hemoglobin of 9.9, baseline of 11 from 2 years ago.  CMP with mild hypokalemia of 3.2.   Acetaminophen and alcohol levels are normal as well as salicylate.  Patient is not pregnant.  Respirate pathogen panel is pending.EKG with prolonged Qtc of 493. Patient without cardiac symptoms  Patient is medically cleared, has a bed waiting for her at facility based crisis unit, Accepting physician Dr. Bronwen Betters.  Patient is stable for transfer to accepting facility.  EMTALA completed by physician.  This chart was dictated using voice recognition software, Dragon. Despite the best efforts of this provider to proofread and correct errors, errors may still occur which can change documentation meaning.   Final Clinical Impression(s) / ED Diagnoses Final diagnoses:  None    Rx / DC Orders ED Discharge Orders     None        Paris Lore, PA-C 11/06/21 1815    Vanetta Mulders, MD 11/07/21 [phone]

## 2021-11-06 NOTE — ED Provider Notes (Signed)
Behavioral Health Admission H&P [facility] & OBS)  Date: 11/06/21 Patient Name: Diane Foley MRN: [id number] Chief Complaint: No chief complaint on file.     Diagnoses:  Final diagnoses:  MDD (major depressive disorder), recurrent severe, without psychosis ([place])  Cocaine abuse ([place])    HPI: Patient presents voluntarily to [facility] behavioral health, transported from [facility] emergency department.  She will be voluntarily admitted to [facility] behavioral health facility based crisis unit for treatment and stabilization.    Patient is assessed face-to-face by nurse practitioner.  She is seated, no acute distress.  She is alert and oriented, cooperative during assessment.   She presents with depressed mood, flat affect.  Depressive symptoms reported include fatigue, loss of appetite, unintentional weight loss, thoughts of suicide and anhedonia.  She reports ongoing and worsening depressive symptoms for approximately 6 weeks.  She endorses passive suicidal ideation and increased depression times approximately 6 weeks.  She reports she would not follow through with suicidal ideation and denies plan.  She states "I feel like it would just be easier not to be here, the reason I have not done it is because of my daughter."  Recent stressors include a break-up with her significant other.  She reports a lengthy break-up with multiple reconciliations over the past 6 weeks.  Additional stressors include a recent miscarriage.  Diane Foley shares that her ex-boyfriend currently has a new girlfriend who is pregnant.  She states "now I have to watch this girl have his baby and I lost mine."  Diane Foley is not currently linked with outpatient psychiatry however she has been prescribed Lexapro 10 mg daily and trazodone 100 mg nightly by primary care provider.  She has a history of substance use disorder.  She reports she was sober 3 years while being prescribed methadone at [facility] clinic in [place] prior  to relapse approximately one week ago.  She shares that her mother has been diagnosed with bipolar disorder.  She states "because of everything that was going on with my relationship I went off the deep end and used for 3 days."  She reports using heroin and crack cocaine, last use 3 days ago.  She denies homicidal ideation.  She reports "with everything that is going on I have thought about it but I would never follow through with that."  She denies any history of suicide attempts, denies any history of nonsuicidal self-harm behavior.  She has normal speech pattern and decreased speech volume.  She denies both auditory and visual hallucinations.  Patient is able to converse coherently with goal-directed thoughts and no distractibility or preoccupation.  She denies paranoia.  Objectively there is no evidence of psychosis/mania or delusional thinking.  Diane Foley resides in [place] with her aunt and 48-year-old daughter, she denies access to weapons.  She reports she typically is employed in [employer] however she lost her job related to her depression.  She reports she felt unable to get out of bed therefore she was unable to attend work. She denies alcohol use, she denies substance use aside from recent relapse on heroin and crack cocaine.  She endorses average sleep and decreased appetite.  Patient offered support and encouragement.    PHQ 2-9:  Flowsheet Row Routine Prenatal from 01/25/2019 in [facility]  Thoughts that you would be better off dead, or of hurting yourself in some way Not at all  PHQ-9 Total Score 15       [place] ED from  11/06/2021 in [facility] to Hosp-Admission (Discharged) from 11/20/2018 in [facility]STEPDOWN  C-SSRS RISK CATEGORY High Risk No Risk        Total Time spent with patient: 30 minutes  Musculoskeletal  Strength & Muscle Tone: within normal  limits Gait & Station: normal Patient leans: N/A  Psychiatric Specialty Exam  Presentation General Appearance: Appropriate for Environment  Eye Contact:Fair  Speech:Clear and Coherent  Speech Volume:Decreased  Handedness:Right   Mood and Affect  Mood:Depressed  Affect:Depressed; Flat   Thought Process  Thought Processes:Coherent; Goal Directed; Linear  Descriptions of Associations:Intact  Orientation:Full (Time, Place and Person)  Thought Content:Logical  Diagnosis of Schizophrenia or Schizoaffective disorder in past: No   Hallucinations:Hallucinations: None  Ideas of Reference:None  Suicidal Thoughts:Suicidal Thoughts: Yes, Passive SI Passive Intent and/or Plan: Without Intent; Without Plan  Homicidal Thoughts:Homicidal Thoughts: No   Sensorium  Memory:Immediate Good; Recent Good; Remote Good  Judgment:Fair  Insight:Fair   Executive Functions  Concentration:Good  Attention Span:Good  Recall:Good  Fund of Knowledge:Good  Language:Good   Psychomotor Activity  Psychomotor Activity:Psychomotor Activity: Normal   Assets  Assets:Communication Skills; Desire for Improvement; Housing; Physical Health; Resilience; Social Support   Sleep  Sleep:Sleep: Fair   Nutritional Assessment (For OBS and FBC admissions only) Has the patient had a weight loss or gain of 10 pounds or more in the last 3 months?: Yes Has the patient had a decrease in food intake/or appetite?: Yes Does the patient have dental problems?: No Does the patient have eating habits or behaviors that may be indicators of an eating disorder including binging or inducing vomiting?: No Has the patient recently lost weight without trying?: 3 Has the patient been eating poorly because of a decreased appetite?: 1 Malnutrition Screening Tool Score: 4   Physical Exam Vitals and nursing note reviewed.  Constitutional:      Appearance: Normal appearance. She is well-developed.  HENT:      Head: Normocephalic and atraumatic.     Nose: Nose normal.  Cardiovascular:     Rate and Rhythm: Normal rate.  Pulmonary:     Effort: Pulmonary effort is normal.  Musculoskeletal:        General: Normal range of motion.     Cervical back: Normal range of motion.  Skin:    General: Skin is warm and dry.  Neurological:     Mental Status: She is alert and oriented to person, place, and time.  Psychiatric:        Attention and Perception: Attention and perception normal.        Mood and Affect: Mood is depressed. Affect is flat.        Speech: Speech normal.        Behavior: Behavior normal. Behavior is cooperative.        Thought Content: Thought content includes suicidal ideation.        Cognition and Memory: Cognition and memory normal.        Judgment: Judgment normal.   Review of Systems  Constitutional: Negative.   HENT: Negative.    Eyes: Negative.   Respiratory: Negative.    Cardiovascular: Negative.   Gastrointestinal: Negative.   Genitourinary: Negative.   Musculoskeletal: Negative.   Skin: Negative.   Neurological: Negative.   Endo/Heme/Allergies: Negative.   Psychiatric/Behavioral:  Positive for depression and suicidal ideas.    Blood pressure 118/66, pulse 74, temperature 98.7 F (37.1 C), temperature source Oral, resp. rate 18, SpO2 97 %. There  is no height or weight on file to calculate BMI.  Past Psychiatric History: MDD, cocaine abuse, polysubstance abuse  Is the patient at risk to self? Yes  Has the patient been a risk to self in the past 6 months? No .    Has the patient been a risk to self within the distant past? No   Is the patient a risk to others? No   Has the patient been a risk to others in the past 6 months? No   Has the patient been a risk to others within the distant past? No   Past Medical History:  Past Medical History:  Diagnosis Date   [redacted] weeks gestation of pregnancy 11/20/2018   Anxiety    Bipolar 1 disorder (HCC)    bipolar    Cocaine abuse (HCC)    Depression    Drug abuse (HCC)    Opioid abuse (HCC)    PTSD (post-traumatic stress disorder)    Sepsis(995.91)    Suicidal ideations    UTI (urinary tract infection)     Past Surgical History:  Procedure Laterality Date   APPENDECTOMY      Family History:  Family History  Problem Relation Age of Onset   Cancer Mother    Anxiety disorder Mother    Depression Mother    Hypertension Father    Anxiety disorder Father    Depression Father    Alcohol abuse Father    Alcohol abuse Maternal Grandfather    Diabetes Paternal Grandfather     Social History:  Social History   Socioeconomic History   Marital status: Single    Spouse name: Not on file   Number of children: Not on file   Years of education: Not on file   Highest education level: Not on file  Occupational History   Not on file  Tobacco Use   Smoking status: Every Day    Packs/day: 0.25    Types: Cigarettes   Smokeless tobacco: Never  Vaping Use   Vaping Use: Never used  Substance and Sexual Activity   Alcohol use: Not Currently    Alcohol/week: 1.0 standard drink    Types: 1 Cans of beer per week    Comment: occasionally   Drug use: Not Currently    Types: Heroin, IV, Cocaine, Other-see comments    Comment: last used in december 2019   Sexual activity: Not Currently    Birth control/protection: None  Other Topics Concern   Not on file  Social History Narrative   ** Merged History Encounter **       Social Determinants of Health   Financial Resource Strain: Not on file  Food Insecurity: Not on file  Transportation Needs: Not on file  Physical Activity: Not on file  Stress: Not on file  Social Connections: Not on file  Intimate Partner Violence: Not on file    SDOH:  SDOH Screenings   Alcohol Screen: Not on file  Depression (PHQ2-9): Not on file  Financial Resource Strain: Not on file  Food Insecurity: Not on file  Housing: Not on file  Physical Activity: Not on  file  Social Connections: Not on file  Stress: Not on file  Tobacco Use: Not on file  Transportation Needs: Not on file    Last Labs:  Admission on 11/06/2021, Discharged on 11/06/2021  Component Date Value Ref Range Status   SARS Coronavirus 2 by RT PCR 11/06/2021 NEGATIVE  NEGATIVE Final   Comment: (NOTE) SARS-CoV-2 target  nucleic acids are NOT DETECTED.  The SARS-CoV-2 RNA is generally detectable in upper respiratory specimens during the acute phase of infection. The lowest concentration of SARS-CoV-2 viral copies this assay can detect is 138 copies/mL. A negative result does not preclude SARS-Cov-2 infection and should not be used as the sole basis for treatment or other patient management decisions. A negative result may occur with  improper specimen collection/handling, submission of specimen other than nasopharyngeal swab, presence of viral mutation(s) within the areas targeted by this assay, and inadequate number of viral copies(<138 copies/mL). A negative result must be combined with clinical observations, patient history, and epidemiological information. The expected result is Negative.  Fact Sheet for Patients:  EntrepreneurPulse.com.au  Fact Sheet for Healthcare Providers:  IncredibleEmployment.be  This test is no                          t yet approved or cleared by the [place] FDA and  has been authorized for detection and/or diagnosis of SARS-CoV-2 by FDA under an Emergency Use Authorization (EUA). This EUA will remain  in effect (meaning this test can be used) for the duration of the COVID-19 declaration under Section 564(b)(1) of the Act, 21 U.S.C.section 360bbb-3(b)(1), unless the authorization is terminated  or revoked sooner.       Influenza A by PCR 11/06/2021 NEGATIVE  NEGATIVE Final   Influenza B by PCR 11/06/2021 NEGATIVE  NEGATIVE Final   Comment: (NOTE) The Xpert Xpress SARS-CoV-2/FLU/RSV plus assay is  intended as an aid in the diagnosis of influenza from Nasopharyngeal swab specimens and should not be used as a sole basis for treatment. Nasal washings and aspirates are unacceptable for Xpert Xpress SARS-CoV-2/FLU/RSV testing.  Fact Sheet for Patients: EntrepreneurPulse.com.au  Fact Sheet for Healthcare Providers: IncredibleEmployment.be  This test is not yet approved or cleared by the [place] FDA and has been authorized for detection and/or diagnosis of SARS-CoV-2 by FDA under an Emergency Use Authorization (EUA). This EUA will remain in effect (meaning this test can be used) for the duration of the COVID-19 declaration under Section 564(b)(1) of the Act, 21 U.S.C. section 360bbb-3(b)(1), unless the authorization is terminated or revoked.  Performed at [facility], [place] [address] [place], [place] [address]    Sodium 11/06/2021 137  135 - 145 mmol/L Final   Potassium 11/06/2021 3.2 (L)  3.5 - 5.1 mmol/L Final   Chloride 11/06/2021 99  98 - 111 mmol/L Final   CO2 11/06/2021 28  22 - 32 mmol/L Final   Glucose, Bld 11/06/2021 102 (H)  70 - 99 mg/dL Final   Glucose reference range applies only to samples taken after fasting for at least 8 hours.   BUN 11/06/2021 8  6 - 20 mg/dL Final   Creatinine, Ser 11/06/2021 0.62  0.44 - 1.00 mg/dL Final   Calcium 11/06/2021 8.9  8.9 - 10.3 mg/dL Final   Total Protein 11/06/2021 7.0  6.5 - 8.1 g/dL Final   Albumin 11/06/2021 3.6  3.5 - 5.0 g/dL Final   AST 11/06/2021 38  15 - 41 U/L Final   ALT 11/06/2021 41  0 - 44 U/L Final   Alkaline Phosphatase 11/06/2021 65  38 - 126 U/L Final   Total Bilirubin 11/06/2021 0.3  0.3 - 1.2 mg/dL Final   GFR, Estimated 11/06/2021 >60  >60 mL/min Final   Comment: (NOTE) Calculated using the CKD-EPI Creatinine Equation (2021)    Anion gap 11/06/2021  10  5 - 15 Final   Performed at [facility] [address]  [place], [place] [address]   Alcohol, Ethyl (B) 11/06/2021 <10  <10 mg/dL Final   Comment: (NOTE) Lowest detectable limit for serum alcohol is 10 mg/dL.  For medical purposes only. Performed at [facility], [place] [address], [place], [place] [address]    WBC 11/06/2021 5.0  4.0 - 10.5 K/uL Final   RBC 11/06/2021 3.91  3.87 - 5.11 MIL/uL Final   Hemoglobin 11/06/2021 9.9 (L)  12.0 - 15.0 g/dL Final   HCT 11/06/2021 31.7 (L)  36.0 - 46.0 % Final   MCV 11/06/2021 81.1  80.0 - 100.0 fL Final   MCH 11/06/2021 25.3 (L)  26.0 - 34.0 pg Final   MCHC 11/06/2021 31.2  30.0 - 36.0 g/dL Final   RDW 11/06/2021 16.7 (H)  11.5 - 15.5 % Final   Platelets 11/06/2021 354  150 - 400 K/uL Final   nRBC 11/06/2021 0.0  0.0 - 0.2 % Final   Neutrophils Relative % 11/06/2021 62  % Final   Neutro Abs 11/06/2021 3.1  1.7 - 7.7 K/uL Final   Lymphocytes Relative 11/06/2021 27  % Final   Lymphs Abs 11/06/2021 1.3  0.7 - 4.0 K/uL Final   Monocytes Relative 11/06/2021 8  % Final   Monocytes Absolute 11/06/2021 0.4  0.1 - 1.0 K/uL Final   Eosinophils Relative 11/06/2021 3  % Final   Eosinophils Absolute 11/06/2021 0.1  0.0 - 0.5 K/uL Final   Basophils Relative 11/06/2021 0  % Final   Basophils Absolute 11/06/2021 0.0  0.0 - 0.1 K/uL Final   Immature Granulocytes 11/06/2021 0  % Final   Abs Immature Granulocytes 11/06/2021 0.01  0.00 - 0.07 K/uL Final   Performed at [facility], [place] [address], [place], [place] [address]   I-stat hCG, quantitative 11/06/2021 <5.0  <5 mIU/mL Final   Comment 3 11/06/2021          Final   Comment:   GEST. AGE      CONC.  (mIU/mL)   <=1 WEEK        5 - 50     2 WEEKS       50 - 500     3 WEEKS       100 - 10,000     4 WEEKS     1,000 - 30,000        FEMALE AND NON-PREGNANT FEMALE:     LESS THAN 5 mIU/mL    Salicylate Lvl 99991111 <7.0 (L)  7.0 - 30.0 mg/dL Final   Performed at [facility], [place] [address], [place], [place]  [address]   Acetaminophen (Tylenol), Serum 11/06/2021 <10 (L)  10 - 30 ug/mL Final   Comment: (NOTE) Therapeutic concentrations vary significantly. A range of 10-30 ug/mL  may be an effective concentration for many patients. However, some  are best treated at concentrations outside of this range. Acetaminophen concentrations >150 ug/mL at 4 hours after ingestion  and >50 ug/mL at 12 hours after ingestion are often associated with  toxic reactions.  Performed at [facility], [place] [address], [place], [place] [address]     Allergies: Patient has no known allergies.  PTA Medications: (Not in a hospital admission)   Medical Decision Making  Patient reviewed with Dr. Serafina Mitchell.  Patient admitted to the facility based crisis unit at [facility] behavioral health for  treatment and stabilization.  Patient remains voluntary at this time. Current medications: -Acetaminophen 650 mg every 6 as needed/mild pain -Maalox 30 mL oral every 4 as needed/digestion -Hydroxyzine 25 mg 3 times daily as needed/anxiety -Magnesium hydroxide 30 mL daily as needed/mild constipation -Trazodone 100 mg nightly  -Escitalopram 10 mg daily    Recommendations  Based on my evaluation the patient does not appear to have an emergency medical condition.  Lucky Rathke, FNP 11/06/21  7:31 PM

## 2021-11-06 NOTE — ED Notes (Addendum)
Pt came to nursing station asking if she had any night time medications. This nurse and Marchelle Folks advised the patient that she does not have any medications ordered for night time in the system. This nurse advised the patient that we will reach out to the provider on staff and see if orders can be put in for the medications she takes at home. Per the patient at home she takes trazodone and her depression medication which was determined to be Lexapro per what was listed in Epic under the patients home meds. A direct message was sent to the provider on duty Selena Batten) in reference to the patients request.

## 2021-11-06 NOTE — ED Notes (Signed)
Pt had no cell phone only need her aunt emergency contact number was needed to complete her call log.

## 2021-11-06 NOTE — ED Notes (Signed)
While patient was being administered her trazodone, the pt stated that she was told she would be given her methadone. This nurse advised the patient that there is no order on file for methadone and that the provider on staff only placed an order for her trazodone and lexapro

## 2021-11-06 NOTE — ED Triage Notes (Signed)
Patient from [facility] .  Patient SI . Patient relapsed on heroin and crack three days ago. Patient voluntary . Patient flat and dull.

## 2021-11-07 DIAGNOSIS — F1410 Cocaine abuse, uncomplicated: Secondary | ICD-10-CM | POA: Diagnosis not present

## 2021-11-07 DIAGNOSIS — F332 Major depressive disorder, recurrent severe without psychotic features: Secondary | ICD-10-CM | POA: Diagnosis not present

## 2021-11-07 DIAGNOSIS — R45851 Suicidal ideations: Secondary | ICD-10-CM | POA: Diagnosis not present

## 2021-11-07 DIAGNOSIS — F17210 Nicotine dependence, cigarettes, uncomplicated: Secondary | ICD-10-CM | POA: Diagnosis not present

## 2021-11-07 LAB — POCT URINE DRUG SCREEN - MANUAL ENTRY (I-SCREEN)
POC Amphetamine UR: NOT DETECTED
POC Buprenorphine (BUP): NOT DETECTED
POC Cocaine UR: POSITIVE — AB
POC Marijuana UR: NOT DETECTED
POC Methadone UR: POSITIVE — AB
POC Methamphetamine UR: NOT DETECTED
POC Morphine: POSITIVE — AB
POC Oxazepam (BZO): NOT DETECTED
POC Oxycodone UR: NOT DETECTED
POC Secobarbital (BAR): NOT DETECTED

## 2021-11-07 MED ORDER — METHADONE HCL 5 MG PO TABS
55.0000 mg | ORAL_TABLET | Freq: Every day | ORAL | Status: DC
  Administered 2021-11-07 – 2021-11-11 (×5): 55 mg via ORAL
  Filled 2021-11-07 (×5): qty 1

## 2021-11-07 MED ORDER — ESCITALOPRAM OXALATE 20 MG PO TABS
20.0000 mg | ORAL_TABLET | Freq: Every day | ORAL | Status: DC
  Administered 2021-11-08 – 2021-11-11 (×4): 20 mg via ORAL
  Filled 2021-11-07 (×3): qty 2
  Filled 2021-11-07: qty 7
  Filled 2021-11-07: qty 2

## 2021-11-07 NOTE — Progress Notes (Signed)
Pt has been sleeping most of the shift. Came out briefly to eat dinner. Currently resting. No distress noted. Monitoring for pt's safety.

## 2021-11-07 NOTE — ED Notes (Signed)
Patient denies SI, HI or AVH.  No signs of distress noted. Patient active in group - will continue to monitor for safety

## 2021-11-07 NOTE — ED Notes (Signed)
Pt asleep in bed. Respirations even and unlabored. Will continue to monitor for safety. ?

## 2021-11-07 NOTE — Progress Notes (Signed)
SPIRITUALITY GROUP NOTE  Spirituality group facilitated by Wilkie Aye, MDiv, BCC.  Group Description: Group focused on topic of hope. Patients participated in facilitated discussion around topic, connecting with one another around experiences and definitions for hope. Group members engaged with visual explorer photos, reflecting on what hope looks like for them today. Group engaged in discussion around how their definitions of hope are present today in hospital.  Modalities: Psycho-social ed, Adlerian, Narrative, MI  Patient Progress: Trystyn was present throughout group.  Noted that her faith and faith community had served as sources of hope for her in the past.  She described her faith community supporting her through detox and period of sobriety.  States she knows they are available to support her.  She states that she has not reached out to them yet.

## 2021-11-07 NOTE — ED Notes (Signed)
Patient resting - no sxs of distress - will continue to monitor for safety 
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
## 2021-11-07 NOTE — ED Notes (Signed)
Pt was offered, breakfast and lunch and refuse when offer.

## 2021-11-07 NOTE — ED Notes (Signed)
Snacks given 

## 2021-11-07 NOTE — Progress Notes (Signed)
Pt is presently asleep. Respirations are even and unlabored. Pt did not voice any complaints of pain or discomfort. No signs of acute distress noted. Administered scheduled meds with no incident. Pt denies current SI/HI/AVH. 15 minutes checks are in effect. Staff will monitor for pt's safety.

## 2021-11-07 NOTE — ED Notes (Signed)
Refused to participate in group

## 2021-11-07 NOTE — Clinical Social Work Psych Note (Signed)
CSW Initial Note    CSW and Dr. Serafina Mitchell, MD met with patient for introduction and to begin discussion regarding treatment and potential discharge planning.  Jadon shared that she has experienced an increase in her depressive symptoms for the last 1.5 months, after she and her ex-fiance "Called -off" their engagement. Adriann reports she took their break -up "hard" and it has been challenging managing her emotions. She also shared that she experienced a miscarriage after their break-up.   Keshawn reports that she and her ex-fiance attempted to reconnect, however she later found out that he had another on the way and had to end their relationship for the second time. Kian reports that she was seeing a Social worker for her ongoing stressors and depression, however it has not been helpful so far.   Deneen reports she is interested in an intensive outpatient treatment at discharge. CSW informed [place] [facility] [place] services. Keven was agreeable with a referral for follow up. She was also open to resources for the [facility] as well.   She reports she received medication management for her Lexapro and Trazadone from a PCP at [place] [facility].   Makaelyn reports she currently lives with her aunt,uncle and 2yo daughter in [place], [place]. She reports her family is very supportive and she plans to return to previous living arrangements at discharge.   CSW will continue to follow.    Radonna Ricker, MSW, LCSW Clinical Education officer, museum ([place]) [facility]

## 2021-11-07 NOTE — ED Notes (Signed)
Pt refused breakfast 

## 2021-11-08 ENCOUNTER — Encounter (HOSPITAL_COMMUNITY): Payer: Self-pay | Admitting: Psychiatry

## 2021-11-08 DIAGNOSIS — R45851 Suicidal ideations: Secondary | ICD-10-CM | POA: Diagnosis not present

## 2021-11-08 DIAGNOSIS — F1410 Cocaine abuse, uncomplicated: Secondary | ICD-10-CM | POA: Diagnosis not present

## 2021-11-08 DIAGNOSIS — F332 Major depressive disorder, recurrent severe without psychotic features: Secondary | ICD-10-CM | POA: Diagnosis not present

## 2021-11-08 DIAGNOSIS — F17210 Nicotine dependence, cigarettes, uncomplicated: Secondary | ICD-10-CM | POA: Diagnosis not present

## 2021-11-08 NOTE — Progress Notes (Signed)
Patient medication compliant.  Denied SI, HI, AVH.  Pleasant, cooperative.  Denied any compliant.

## 2021-11-08 NOTE — ED Provider Notes (Signed)
Behavioral Health Progress Note  Date and Time: 11/08/2021 10:57 AM Name: Diane Foley MRN:  [id number]  Subjective:   35 year old female with history of MDD, opioid use currently on medication and assisted treatment (methadone) who presented to [facility] with suicidal thoughts on 11/30 in the context of conflict with ex-boyfriend , recent miscarriage and relapsing on crack cocaine and heroin for a couple to use prior to presentation.  Patient was transferred to [facility], ED for medical clearance and then accepted to the [facility] for further treatment.  Patient seen and chart reviewed-she has been medication compliant.  She has been appropriate with staff and peers on the unit and has been attending groups.  Patient interviewed in her room this morning.  She is lying in bed in no acute distress and is facing the wall, she paces the wall for the entirety of interview and does not make eye contact.  Patient reports her mood is "okay".  And rates it a 3 out of 10 (10 being the best).  She reports her anxiety is "still pretty bad" but indicates that groups have been helpful.  She cites the AA meeting yesterday as being particularly helpful.  She does report continued decreased appetite although was able to eat some breakfast this morning.  She reports sleeping well through the night; however, woke up around 6 AM and has been unable to return to sleep since that time.  She states that she has talked with some family members on the phone and that it "went well".  She denies all physical complaints.  Encouraged patient to continue attending groups today and to relay any concerns to staff members.  Discussed with patient that I would not be here over the weekend but that another provider will be, patient verbalized understanding.  She denies SI/HI/AVH.   Diagnosis:  Final diagnoses:  MDD (major depressive disorder), recurrent severe, without psychosis ([place])  Cocaine abuse ([place])    Total Time spent with patient: 15  minutes  Past Psychiatric History: depression, anxiety. Substance use Past Medical History:  Past Medical History:  Diagnosis Date   [redacted] weeks gestation of pregnancy 11/20/2018   Anxiety    Bipolar 1 disorder ([place])    bipolar   Cocaine abuse ([place])    Depression    Drug abuse ([place])    Opioid abuse ([place])    PTSD (post-traumatic stress disorder)    Sepsis(995.91)    Suicidal ideations    UTI (urinary tract infection)     Past Surgical History:  Procedure Laterality Date   APPENDECTOMY     Family History:  Family History  Problem Relation Age of Onset   Cancer Mother    Anxiety disorder Mother    Depression Mother    Hypertension Father    Anxiety disorder Father    Depression Father    Alcohol abuse Father    Alcohol abuse Maternal Grandfather    Diabetes Paternal Grandfather    Family Psychiatric  History: denies however  Social History:  Social History   Substance and Sexual Activity  Alcohol Use Not Currently   Alcohol/week: 1.0 standard drink   Types: 1 Cans of beer per week   Comment: occasionally     Social History   Substance and Sexual Activity  Drug Use Not Currently   Types: Heroin, IV, Cocaine, Other-see comments   Comment: last used in december 2019    Social History   Socioeconomic History   Marital status: Single  Spouse name: Not on file   Number of children: Not on file   Years of education: Not on file   Highest education level: Not on file  Occupational History   Not on file  Tobacco Use   Smoking status: Every Day    Packs/day: 0.25    Types: Cigarettes   Smokeless tobacco: Never  Vaping Use   Vaping Use: Never used  Substance and Sexual Activity   Alcohol use: Not Currently    Alcohol/week: 1.0 standard drink    Types: 1 Cans of beer per week    Comment: occasionally   Drug use: Not Currently    Types: Heroin, IV, Cocaine, Other-see comments    Comment: last used in december 2019   Sexual activity: Not Currently    Birth  control/protection: None  Other Topics Concern   Not on file  Social History Narrative   ** Merged History Encounter **       Social Determinants of Health   Financial Resource Strain: Not on file  Food Insecurity: Not on file  Transportation Needs: Not on file  Physical Activity: Not on file  Stress: Not on file  Social Connections: Not on file   SDOH:  SDOH Screenings   Alcohol Screen: Not on file  Depression (PHQ2-9): Not on file  Financial Resource Strain: Not on file  Food Insecurity: Not on file  Housing: Not on file  Physical Activity: Not on file  Social Connections: Not on file  Stress: Not on file  Tobacco Use: High Risk   Smoking Tobacco Use: Every Day   Smokeless Tobacco Use: Never   Passive Exposure: Not on file  Transportation Needs: Not on file   Additional Social History:                         Sleep: Poor  Appetite:  Fair  Current Medications:  Current Facility-Administered Medications  Medication Dose Route Frequency Provider Last Rate Last Admin   acetaminophen (TYLENOL) tablet 650 mg  650 mg Oral Q6H PRN Lenard Lance, FNP       alum & mag hydroxide-simeth (MAALOX/MYLANTA) 200-200-20 MG/5ML suspension 30 mL  30 mL Oral Q4H PRN Lenard Lance, FNP       escitalopram (LEXAPRO) tablet 20 mg  20 mg Oral Daily Estella Husk, MD   20 mg at 11/08/21 2992   hydrOXYzine (ATARAX) tablet 25 mg  25 mg Oral TID PRN Lenard Lance, FNP       magnesium hydroxide (MILK OF MAGNESIA) suspension 30 mL  30 mL Oral Daily PRN Lenard Lance, FNP       methadone (DOLOPHINE) tablet 55 mg  55 mg Oral Daily Estella Husk, MD   55 mg at 11/08/21 0906   traZODone (DESYREL) tablet 100 mg  100 mg Oral QHS Laveda Abbe, NP   100 mg at 11/07/21 2155   Current Outpatient Medications  Medication Sig Dispense Refill   escitalopram (LEXAPRO) 10 MG tablet Take 10 mg by mouth at bedtime.     methadone (DOLOPHINE) 10 MG/ML solution Take 55 mg by mouth  daily.     traZODone (DESYREL) 50 MG tablet Take 100 mg by mouth at bedtime.      Labs  Lab Results:  Admission on 11/06/2021  Component Date Value Ref Range Status   POC Amphetamine UR 11/07/2021 None Detected  NONE DETECTED (Cut Off Level 1000 ng/mL) Final  POC Secobarbital (BAR) 11/07/2021 None Detected  NONE DETECTED (Cut Off Level 300 ng/mL) Final   POC Buprenorphine (BUP) 11/07/2021 None Detected  NONE DETECTED (Cut Off Level 10 ng/mL) Final   POC Oxazepam (BZO) 11/07/2021 None Detected  NONE DETECTED (Cut Off Level 300 ng/mL) Final   POC Cocaine UR 11/07/2021 Positive (A)  NONE DETECTED (Cut Off Level 300 ng/mL) Final   POC Methamphetamine UR 11/07/2021 None Detected  NONE DETECTED (Cut Off Level 1000 ng/mL) Final   POC Morphine 11/07/2021 Positive (A)  NONE DETECTED (Cut Off Level 300 ng/mL) Final   POC Oxycodone UR 11/07/2021 None Detected  NONE DETECTED (Cut Off Level 100 ng/mL) Final   POC Methadone UR 11/07/2021 Positive (A)  NONE DETECTED (Cut Off Level 300 ng/mL) Final   POC Marijuana UR 11/07/2021 None Detected  NONE DETECTED (Cut Off Level 50 ng/mL) Final  Admission on 11/06/2021, Discharged on 11/06/2021  Component Date Value Ref Range Status   SARS Coronavirus 2 by RT PCR 11/06/2021 NEGATIVE  NEGATIVE Final   Comment: (NOTE) SARS-CoV-2 target nucleic acids are NOT DETECTED.  The SARS-CoV-2 RNA is generally detectable in upper respiratory specimens during the acute phase of infection. The lowest concentration of SARS-CoV-2 viral copies this assay can detect is 138 copies/mL. A negative result does not preclude SARS-Cov-2 infection and should not be used as the sole basis for treatment or other patient management decisions. A negative result may occur with  improper specimen collection/handling, submission of specimen other than nasopharyngeal swab, presence of viral mutation(s) within the areas targeted by this assay, and inadequate number of viral copies(<138  copies/mL). A negative result must be combined with clinical observations, patient history, and epidemiological information. The expected result is Negative.  Fact Sheet for Patients:  EntrepreneurPulse.com.au  Fact Sheet for Healthcare Providers:  IncredibleEmployment.be  This test is no                          t yet approved or cleared by the [place] FDA and  has been authorized for detection and/or diagnosis of SARS-CoV-2 by FDA under an Emergency Use Authorization (EUA). This EUA will remain  in effect (meaning this test can be used) for the duration of the COVID-19 declaration under Section 564(b)(1) of the Act, 21 U.S.C.section 360bbb-3(b)(1), unless the authorization is terminated  or revoked sooner.       Influenza A by PCR 11/06/2021 NEGATIVE  NEGATIVE Final   Influenza B by PCR 11/06/2021 NEGATIVE  NEGATIVE Final   Comment: (NOTE) The Xpert Xpress SARS-CoV-2/FLU/RSV plus assay is intended as an aid in the diagnosis of influenza from Nasopharyngeal swab specimens and should not be used as a sole basis for treatment. Nasal washings and aspirates are unacceptable for Xpert Xpress SARS-CoV-2/FLU/RSV testing.  Fact Sheet for Patients: EntrepreneurPulse.com.au  Fact Sheet for Healthcare Providers: IncredibleEmployment.be  This test is not yet approved or cleared by the [place] FDA and has been authorized for detection and/or diagnosis of SARS-CoV-2 by FDA under an Emergency Use Authorization (EUA). This EUA will remain in effect (meaning this test can be used) for the duration of the COVID-19 declaration under Section 564(b)(1) of the Act, 21 U.S.C. section 360bbb-3(b)(1), unless the authorization is terminated or revoked.  Performed at [facility], [place] [address] [place], [place] [address]    Sodium 11/06/2021 137  135 - 145 mmol/L Final   Potassium 11/06/2021  3.2 (L)  3.5 - 5.1  mmol/L Final   Chloride 11/06/2021 99  98 - 111 mmol/L Final   CO2 11/06/2021 28  22 - 32 mmol/L Final   Glucose, Bld 11/06/2021 102 (H)  70 - 99 mg/dL Final   Glucose reference range applies only to samples taken after fasting for at least 8 hours.   BUN 11/06/2021 8  6 - 20 mg/dL Final   Creatinine, Ser 11/06/2021 0.62  0.44 - 1.00 mg/dL Final   Calcium 11/06/2021 8.9  8.9 - 10.3 mg/dL Final   Total Protein 11/06/2021 7.0  6.5 - 8.1 g/dL Final   Albumin 11/06/2021 3.6  3.5 - 5.0 g/dL Final   AST 11/06/2021 38  15 - 41 U/L Final   ALT 11/06/2021 41  0 - 44 U/L Final   Alkaline Phosphatase 11/06/2021 65  38 - 126 U/L Final   Total Bilirubin 11/06/2021 0.3  0.3 - 1.2 mg/dL Final   GFR, Estimated 11/06/2021 >60  >60 mL/min Final   Comment: (NOTE) Calculated using the CKD-EPI Creatinine Equation (2021)    Anion gap 11/06/2021 10  5 - 15 Final   Performed at [facility], [place] [address], [place], [place] [address]   Alcohol, Ethyl (B) 11/06/2021 <10  <10 mg/dL Final   Comment: (NOTE) Lowest detectable limit for serum alcohol is 10 mg/dL.  For medical purposes only. Performed at [facility], [place] [address], [place], [place] [address]    WBC 11/06/2021 5.0  4.0 - 10.5 K/uL Final   RBC 11/06/2021 3.91  3.87 - 5.11 MIL/uL Final   Hemoglobin 11/06/2021 9.9 (L)  12.0 - 15.0 g/dL Final   HCT 11/06/2021 31.7 (L)  36.0 - 46.0 % Final   MCV 11/06/2021 81.1  80.0 - 100.0 fL Final   MCH 11/06/2021 25.3 (L)  26.0 - 34.0 pg Final   MCHC 11/06/2021 31.2  30.0 - 36.0 g/dL Final   RDW 11/06/2021 16.7 (H)  11.5 - 15.5 % Final   Platelets 11/06/2021 354  150 - 400 K/uL Final   nRBC 11/06/2021 0.0  0.0 - 0.2 % Final   Neutrophils Relative % 11/06/2021 62  % Final   Neutro Abs 11/06/2021 3.1  1.7 - 7.7 K/uL Final   Lymphocytes Relative 11/06/2021 27  % Final   Lymphs Abs 11/06/2021 1.3  0.7 - 4.0 K/uL Final   Monocytes Relative 11/06/2021 8   % Final   Monocytes Absolute 11/06/2021 0.4  0.1 - 1.0 K/uL Final   Eosinophils Relative 11/06/2021 3  % Final   Eosinophils Absolute 11/06/2021 0.1  0.0 - 0.5 K/uL Final   Basophils Relative 11/06/2021 0  % Final   Basophils Absolute 11/06/2021 0.0  0.0 - 0.1 K/uL Final   Immature Granulocytes 11/06/2021 0  % Final   Abs Immature Granulocytes 11/06/2021 0.01  0.00 - 0.07 K/uL Final   Performed at [facility], [place] [address], [place], [place] [address]   I-stat hCG, quantitative 11/06/2021 <5.0  <5 mIU/mL Final   Comment 3 11/06/2021          Final   Comment:   GEST. AGE      CONC.  (mIU/mL)   <=1 WEEK        5 - 50     2 WEEKS       50 - 500     3 WEEKS       100 - 10,000     4 WEEKS  1,000 - 30,000        FEMALE AND NON-PREGNANT FEMALE:     LESS THAN 5 mIU/mL    Salicylate Lvl 99991111 <7.0 (L)  7.0 - 30.0 mg/dL Final   Performed at [facility], [place] [address] [place], [place] [address]   Acetaminophen (Tylenol), Serum 11/06/2021 <10 (L)  10 - 30 ug/mL Final   Comment: (NOTE) Therapeutic concentrations vary significantly. A range of 10-30 ug/mL  may be an effective concentration for many patients. However, some  are best treated at concentrations outside of this range. Acetaminophen concentrations >150 ug/mL at 4 hours after ingestion  and >50 ug/mL at 12 hours after ingestion are often associated with  toxic reactions.  Performed at [facility], [place] [address], [place], [place] [address]     Blood Alcohol level:  Lab Results  Component Value Date   ETH <10 11/06/2021   ETH <10 0000000    Metabolic Disorder Labs: No results found for: HGBA1C, MPG No results found for: PROLACTIN No results found for: CHOL, TRIG, HDL, CHOLHDL, VLDL, LDLCALC  Therapeutic Lab Levels: No results found for: LITHIUM No results found for: VALPROATE No components found for:  CBMZ  Physical Findings   AUDIT     Flowsheet Row Admission (Discharged) from 02/09/2015 in [facility]  Alcohol Use Disorder Identification Test Final Score (AUDIT) 2      GAD-7    Flowsheet Row Routine Prenatal from 01/25/2019 in [place] for [facility]  Total GAD-7 Score 13      PHQ2-9    Flowsheet Row Routine Prenatal from 01/25/2019 in [place] for [facility]  PHQ-2 Total Score 4  PHQ-9 Total Score 15      Flowsheet Row ED from 11/06/2021 in [facility] Most recent reading at 11/06/2021 10:52 PM ED from 11/06/2021 in [facility] DEPT Most recent reading at 11/06/2021  2:00 PM ED to Hosp-Admission (Discharged) from 11/20/2018 in [facility] Most recent reading at 11/20/2018  5:00 PM  C-SSRS RISK CATEGORY High Risk High Risk No Risk        Musculoskeletal  Strength & Muscle Tone: within normal limits Gait & Station: normal Patient leans: N/A  Psychiatric Specialty Exam  Presentation  General Appearance: Appropriate for Environment; [place] Contact:None; Other (comment) (patient laying in bed facing the wall for entirety of interview)  Speech:Clear and Coherent; Normal Rate  Speech Volume:Decreased  Handedness:Right   Mood and Affect  Mood:Depressed ("3/10")  Affect:Other (comment) (UTA; patient facing wall entirety of interview)   Thought Process  Thought Processes:Coherent; Goal Directed; Linear  Descriptions of Associations:Intact  Orientation:Full (Time, Place and Person)  Thought Content:WDL; Logical  Diagnosis of Schizophrenia or Schizoaffective disorder in past: No    Hallucinations:Hallucinations: None  Ideas of Reference:None  Suicidal Thoughts:Suicidal Thoughts: No SI Passive Intent and/or Plan: Without Intent; Without Plan  Homicidal Thoughts:Homicidal Thoughts: No   Sensorium  Memory:Immediate Good;  Recent Good; Remote Good  Judgment:Fair  Insight:Fair   Executive Functions  Concentration:Good  Attention Span:Good  Recall:Good  Fund of Knowledge:Good  Language:Good   Psychomotor Activity  Psychomotor Activity:Psychomotor Activity: Normal   Assets  Assets:Communication Skills; Desire for Improvement; Social Support; Physical Health; Resilience   Sleep  Sleep:Sleep: Fair   No data recorded   Physical Exam  Physical Exam Constitutional:      Appearance: Normal appearance. She is normal weight.  HENT:  Head: Normocephalic and atraumatic.  Eyes:     Extraocular Movements: Extraocular movements intact.     Conjunctiva/sclera: Conjunctivae normal.  Cardiovascular:     Rate and Rhythm: Normal rate and regular rhythm.  Pulmonary:     Effort: Pulmonary effort is normal.  Neurological:     Mental Status: She is alert and oriented to person, place, and time.   Review of Systems  Constitutional:  Negative for chills and fever.  HENT:  Negative for hearing loss.   Eyes:  Negative for discharge and redness.  Respiratory:  Negative for cough.   Cardiovascular:  Negative for chest pain.  Gastrointestinal:  Negative for abdominal pain.  Musculoskeletal:  Negative for myalgias.  Neurological:  Negative for headaches.  Psychiatric/Behavioral:  Positive for depression. Negative for hallucinations and suicidal ideas.   Blood pressure 95/61, pulse 62, temperature (!) 97.1 F (36.2 C), temperature source Tympanic, resp. rate 18, SpO2 98 %. There is no height or weight on file to calculate BMI.  Treatment Plan Summary: 35 year old female with history of MDD, opioid use currently on medication and assisted treatment-methadone" who presented to [facility] with suicidal thoughts on the 11/30 in the context of conflict with ex-boyfriend , recent miscarriage and relapsing on crack cocaine and heroin for a couple to use prior to presentation.  Patient was transferred to [facility], ED for medical clearance and then accepted to the [facility] for further treatment.  Patient denies SI/HI/AVH today although continues to report severe depression-mood 3/10 (10 being the best). She tolerated increased dose of Lexapro to 20 mg today without issue..  She remains appropriate for continued treatment at the [facility] at this time.   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 -she will need to follow up with methadone clinic upon discharge and prescription for methadone will not be provided.  Dispo: ongoing. SW assisting., Likely self care with referral to PHP/IOP.  Ival Bible, MD 11/08/2021 10:57 AM

## 2021-11-08 NOTE — Progress Notes (Signed)
Patient resting in bed with eyes closed.  Respirations even and unlabored.  Continue to monitor for safety. °

## 2021-11-08 NOTE — ED Notes (Signed)
Refused group

## 2021-11-08 NOTE — ED Notes (Signed)
Patient resting with no sxs of distress - will continue to monitor for safety 

## 2021-11-08 NOTE — Progress Notes (Signed)
Patient in dining room for breakfast.  Quiet, cooperative.  Continue to monitor for safety.

## 2021-11-08 NOTE — ED Notes (Signed)
Pt eating breakfast 

## 2021-11-08 NOTE — Progress Notes (Signed)
Received Diane Foley this PM in the day room socializing with her peers in the dining room. Later she was compliant with her medication. She endorsed passive suicidal ideations without a plan. She stated feeling the same related to her admission. Her affect was flat.

## 2021-11-09 DIAGNOSIS — F1410 Cocaine abuse, uncomplicated: Secondary | ICD-10-CM | POA: Diagnosis not present

## 2021-11-09 DIAGNOSIS — R45851 Suicidal ideations: Secondary | ICD-10-CM | POA: Diagnosis not present

## 2021-11-09 DIAGNOSIS — F332 Major depressive disorder, recurrent severe without psychotic features: Secondary | ICD-10-CM | POA: Diagnosis not present

## 2021-11-09 DIAGNOSIS — F17210 Nicotine dependence, cigarettes, uncomplicated: Secondary | ICD-10-CM | POA: Diagnosis not present

## 2021-11-09 NOTE — ED Notes (Signed)
Pt came to nursing station asking if it was time for her medication. This nurse advised the patient that the earliest she can take her medication is 9pm.

## 2021-11-09 NOTE — ED Provider Notes (Addendum)
Behavioral Health Progress Note  Date and Time: 11/09/2021 8:50 AM Name: Diane Foley MRN:  [id number]  Subjective:  Diane Foley, 35 y.o., female patient seen face to face by this provider, chart reviewed and discussed with Dr. Lucianne Muss on 11/09/21.  Patient reports a history MDD, and opioid use.  She is currently on MAT treatment.  Reports recent break-up with boyfriend, recent miscarriage and relapsing on crack cocaine and heroin prior to admission.  Patient was transferred to [facility] from [facility] long ED for treatment.   On evaluation Diane Foley is sitting in her bed.  She is alert/oriented x4 and cooperative.  She makes good eye contact.  Speech is clear, coherent, normal rate and tone.  Reports her mood as "getting better".  She endorses depression and anxiety, with a congruent affect.  Denies any concerns with sleep and reports appetite is improving.  She does not appear to be responding to internal/external stimuli.  She denies AVH/HI/SI.  States that she is not ready to be discharged today, possibly tomorrow. She denies any withdrawal symptoms.  Denies any health concerns.  She is tolerating medications without adverse reactions.patient continues to refuse group sessions. Reassurance, support, and encouragement provided.   Diagnosis:  Final diagnoses:  MDD (major depressive disorder), recurrent severe, without psychosis (HCC)  Cocaine abuse (HCC)    Total Time spent with patient: 30 minutes  Past Psychiatric History: See H&P Past Medical History:  Past Medical History:  Diagnosis Date   [redacted] weeks gestation of pregnancy 11/20/2018   Anxiety    Bipolar 1 disorder (HCC)    bipolar   Cocaine abuse (HCC)    Depression    Drug abuse (HCC)    Opioid abuse (HCC)    PTSD (post-traumatic stress disorder)    Sepsis(995.91)    Suicidal ideations    UTI (urinary tract infection)     Past Surgical History:  Procedure Laterality Date   APPENDECTOMY     Family History:  Family History   Problem Relation Age of Onset   Cancer Mother    Anxiety disorder Mother    Depression Mother    Hypertension Father    Anxiety disorder Father    Depression Father    Alcohol abuse Father    Alcohol abuse Maternal Grandfather    Diabetes Paternal Grandfather    Family Psychiatric  History: See H&P Social History:  Social History   Substance and Sexual Activity  Alcohol Use Not Currently   Alcohol/week: 1.0 standard drink   Types: 1 Cans of beer per week   Comment: occasionally     Social History   Substance and Sexual Activity  Drug Use Not Currently   Types: Heroin, IV, Cocaine, Other-see comments   Comment: last used in december 2019    Social History   Socioeconomic History   Marital status: Single    Spouse name: Not on file   Number of children: Not on file   Years of education: Not on file   Highest education level: Not on file  Occupational History   Not on file  Tobacco Use   Smoking status: Every Day    Packs/day: 0.25    Types: Cigarettes   Smokeless tobacco: Never  Vaping Use   Vaping Use: Never used  Substance and Sexual Activity   Alcohol use: Not Currently    Alcohol/week: 1.0 standard drink    Types: 1 Cans of beer per week    Comment: occasionally   Drug use: Not Currently  Types: Heroin, IV, Cocaine, Other-see comments    Comment: last used in december 2019   Sexual activity: Not Currently    Birth control/protection: None  Other Topics Concern   Not on file  Social History Narrative   ** Merged History Encounter **       Social Determinants of Health   Financial Resource Strain: Not on file  Food Insecurity: Not on file  Transportation Needs: Not on file  Physical Activity: Not on file  Stress: Not on file  Social Connections: Not on file   SDOH:  SDOH Screenings   Alcohol Screen: Not on file  Depression (PHQ2-9): Not on file  Financial Resource Strain: Not on file  Food Insecurity: Not on file  Housing: Not on file   Physical Activity: Not on file  Social Connections: Not on file  Stress: Not on file  Tobacco Use: High Risk   Smoking Tobacco Use: Every Day   Smokeless Tobacco Use: Never   Passive Exposure: Not on file  Transportation Needs: Not on file   Additional Social History:                         Sleep: Good  Appetite:  Fair  Current Medications:  Current Facility-Administered Medications  Medication Dose Route Frequency Provider Last Rate Last Admin   acetaminophen (TYLENOL) tablet 650 mg  650 mg Oral Q6H PRN Lucky Rathke, FNP       alum & mag hydroxide-simeth (MAALOX/MYLANTA) 200-200-20 MG/5ML suspension 30 mL  30 mL Oral Q4H PRN Lucky Rathke, FNP       escitalopram (LEXAPRO) tablet 20 mg  20 mg Oral Daily Ival Bible, MD   20 mg at 11/08/21 [id number]   hydrOXYzine (ATARAX) tablet 25 mg  25 mg Oral TID PRN Lucky Rathke, FNP       magnesium hydroxide (MILK OF MAGNESIA) suspension 30 mL  30 mL Oral Daily PRN Lucky Rathke, FNP       methadone (DOLOPHINE) tablet 55 mg  55 mg Oral Daily Ival Bible, MD   55 mg at 11/08/21 0906   traZODone (DESYREL) tablet 100 mg  100 mg Oral QHS Ethelene Hal, NP   100 mg at 11/08/21 2147   Current Outpatient Medications  Medication Sig Dispense Refill   escitalopram (LEXAPRO) 10 MG tablet Take 10 mg by mouth at bedtime.     methadone (DOLOPHINE) 10 MG/ML solution Take 55 mg by mouth daily.     traZODone (DESYREL) 50 MG tablet Take 100 mg by mouth at bedtime.      Labs  Lab Results:  Admission on 11/06/2021  Component Date Value Ref Range Status   POC Amphetamine UR 11/07/2021 None Detected  NONE DETECTED (Cut Off Level 1000 ng/mL) Final   POC Secobarbital (BAR) 11/07/2021 None Detected  NONE DETECTED (Cut Off Level 300 ng/mL) Final   POC Buprenorphine (BUP) 11/07/2021 None Detected  NONE DETECTED (Cut Off Level 10 ng/mL) Final   POC Oxazepam (BZO) 11/07/2021 None Detected  NONE DETECTED (Cut Off Level 300 ng/mL)  Final   POC Cocaine UR 11/07/2021 Positive (A)  NONE DETECTED (Cut Off Level 300 ng/mL) Final   POC Methamphetamine UR 11/07/2021 None Detected  NONE DETECTED (Cut Off Level 1000 ng/mL) Final   POC Morphine 11/07/2021 Positive (A)  NONE DETECTED (Cut Off Level 300 ng/mL) Final   POC Oxycodone UR 11/07/2021 None Detected  NONE DETECTED (Cut Off  Level 100 ng/mL) Final   POC Methadone UR 11/07/2021 Positive (A)  NONE DETECTED (Cut Off Level 300 ng/mL) Final   POC Marijuana UR 11/07/2021 None Detected  NONE DETECTED (Cut Off Level 50 ng/mL) Final  Admission on 11/06/2021, Discharged on 11/06/2021  Component Date Value Ref Range Status   SARS Coronavirus 2 by RT PCR 11/06/2021 NEGATIVE  NEGATIVE Final   Comment: (NOTE) SARS-CoV-2 target nucleic acids are NOT DETECTED.  The SARS-CoV-2 RNA is generally detectable in upper respiratory specimens during the acute phase of infection. The lowest concentration of SARS-CoV-2 viral copies this assay can detect is 138 copies/mL. A negative result does not preclude SARS-Cov-2 infection and should not be used as the sole basis for treatment or other patient management decisions. A negative result may occur with  improper specimen collection/handling, submission of specimen other than nasopharyngeal swab, presence of viral mutation(s) within the areas targeted by this assay, and inadequate number of viral copies(<138 copies/mL). A negative result must be combined with clinical observations, patient history, and epidemiological information. The expected result is Negative.  Fact Sheet for Patients:  BloggerCourse.com  Fact Sheet for Healthcare Providers:  SeriousBroker.it  This test is no                          t yet approved or cleared by the [place] FDA and  has been authorized for detection and/or diagnosis of SARS-CoV-2 by FDA under an Emergency Use Authorization (EUA). This EUA will remain   in effect (meaning this test can be used) for the duration of the COVID-19 declaration under Section 564(b)(1) of the Act, 21 U.S.C.section 360bbb-3(b)(1), unless the authorization is terminated  or revoked sooner.       Influenza A by PCR 11/06/2021 NEGATIVE  NEGATIVE Final   Influenza B by PCR 11/06/2021 NEGATIVE  NEGATIVE Final   Comment: (NOTE) The Xpert Xpress SARS-CoV-2/FLU/RSV plus assay is intended as an aid in the diagnosis of influenza from Nasopharyngeal swab specimens and should not be used as a sole basis for treatment. Nasal washings and aspirates are unacceptable for Xpert Xpress SARS-CoV-2/FLU/RSV testing.  Fact Sheet for Patients: BloggerCourse.com  Fact Sheet for Healthcare Providers: SeriousBroker.it  This test is not yet approved or cleared by the [place] FDA and has been authorized for detection and/or diagnosis of SARS-CoV-2 by FDA under an Emergency Use Authorization (EUA). This EUA will remain in effect (meaning this test can be used) for the duration of the COVID-19 declaration under Section 564(b)(1) of the Act, 21 U.S.C. section 360bbb-3(b)(1), unless the authorization is terminated or revoked.  Performed at [facility], [address] [place], [place] [address]    Sodium 11/06/2021 137  135 - 145 mmol/L Final   Potassium 11/06/2021 3.2 (L)  3.5 - 5.1 mmol/L Final   Chloride 11/06/2021 99  98 - 111 mmol/L Final   CO2 11/06/2021 28  22 - 32 mmol/L Final   Glucose, Bld 11/06/2021 102 (H)  70 - 99 mg/dL Final   Glucose reference range applies only to samples taken after fasting for at least 8 hours.   BUN 11/06/2021 8  6 - 20 mg/dL Final   Creatinine, Ser 11/06/2021 0.62  0.44 - 1.00 mg/dL Final   Calcium 42/70/6237 8.9  8.9 - 10.3 mg/dL Final   Total Protein 62/83/1517 7.0  6.5 - 8.1 g/dL Final   Albumin 61/60/7371 3.6  3.5 - 5.0 g/dL Final  AST 11/06/2021 38  15 - 41  U/L Final   ALT 11/06/2021 41  0 - 44 U/L Final   Alkaline Phosphatase 11/06/2021 65  38 - 126 U/L Final   Total Bilirubin 11/06/2021 0.3  0.3 - 1.2 mg/dL Final   GFR, Estimated 11/06/2021 >60  >60 mL/min Final   Comment: (NOTE) Calculated using the CKD-EPI Creatinine Equation (2021)    Anion gap 11/06/2021 10  5 - 15 Final   Performed at [facility], [place] [address] [place], [place] [address]   Alcohol, Ethyl (B) 11/06/2021 <10  <10 mg/dL Final   Comment: (NOTE) Lowest detectable limit for serum alcohol is 10 mg/dL.  For medical purposes only. Performed at [facility], [place] [address] [place], [place] [address]    WBC 11/06/2021 5.0  4.0 - 10.5 K/uL Final   RBC 11/06/2021 3.91  3.87 - 5.11 MIL/uL Final   Hemoglobin 11/06/2021 9.9 (L)  12.0 - 15.0 g/dL Final   HCT 11/06/2021 31.7 (L)  36.0 - 46.0 % Final   MCV 11/06/2021 81.1  80.0 - 100.0 fL Final   MCH 11/06/2021 25.3 (L)  26.0 - 34.0 pg Final   MCHC 11/06/2021 31.2  30.0 - 36.0 g/dL Final   RDW 11/06/2021 16.7 (H)  11.5 - 15.5 % Final   Platelets 11/06/2021 354  150 - 400 K/uL Final   nRBC 11/06/2021 0.0  0.0 - 0.2 % Final   Neutrophils Relative % 11/06/2021 62  % Final   Neutro Abs 11/06/2021 3.1  1.7 - 7.7 K/uL Final   Lymphocytes Relative 11/06/2021 27  % Final   Lymphs Abs 11/06/2021 1.3  0.7 - 4.0 K/uL Final   Monocytes Relative 11/06/2021 8  % Final   Monocytes Absolute 11/06/2021 0.4  0.1 - 1.0 K/uL Final   Eosinophils Relative 11/06/2021 3  % Final   Eosinophils Absolute 11/06/2021 0.1  0.0 - 0.5 K/uL Final   Basophils Relative 11/06/2021 0  % Final   Basophils Absolute 11/06/2021 0.0  0.0 - 0.1 K/uL Final   Immature Granulocytes 11/06/2021 0  % Final   Abs Immature Granulocytes 11/06/2021 0.01  0.00 - 0.07 K/uL Final   Performed at [facility], [place] [address], [place], [place] [address]   I-stat hCG, quantitative 11/06/2021 <5.0  <5 mIU/mL Final    Comment 3 11/06/2021          Final   Comment:   GEST. AGE      CONC.  (mIU/mL)   <=1 WEEK        5 - 50     2 WEEKS       50 - 500     3 WEEKS       100 - 10,000     4 WEEKS     1,000 - 30,000        FEMALE AND NON-PREGNANT FEMALE:     LESS THAN 5 mIU/mL    Salicylate Lvl 99991111 <7.0 (L)  7.0 - 30.0 mg/dL Final   Performed at [facility], [place] [address], [place], [place] [address]   Acetaminophen (Tylenol), Serum 11/06/2021 <10 (L)  10 - 30 ug/mL Final   Comment: (NOTE) Therapeutic concentrations vary significantly. A range of 10-30 ug/mL  may be an effective concentration for many patients. However, some  are best treated at concentrations outside of this range. Acetaminophen concentrations >150 ug/mL at 4 hours after ingestion  and >50  ug/mL at 12 hours after ingestion are often associated with  toxic reactions.  Performed at [facility], [place] [address], [place], [place] [address]     Blood Alcohol level:  Lab Results  Component Value Date   ETH <10 11/06/2021   ETH <10 0000000    Metabolic Disorder Labs: No results found for: HGBA1C, MPG No results found for: PROLACTIN No results found for: CHOL, TRIG, HDL, CHOLHDL, VLDL, LDLCALC  Therapeutic Lab Levels: No results found for: LITHIUM No results found for: VALPROATE No components found for:  CBMZ  Physical Findings   AUDIT    Flowsheet Row Admission (Discharged) from 02/09/2015 in [place] [facility]  Alcohol Use Disorder Identification Test Final Score (AUDIT) 2      GAD-7    Flowsheet Row Routine Prenatal from 01/25/2019 in [place] for [facility]  Total GAD-7 Score 13      PHQ2-9    Flowsheet Row Routine Prenatal from 01/25/2019 in [place] for [facility]  PHQ-2 Total Score 4  PHQ-9 Total Score [place] [facility] from 11/06/2021 in [facility] Most recent  reading at 11/06/2021 10:52 PM ED from 11/06/2021 in [place] DEPT Most recent reading at 11/06/2021  2:00 PM ED to Hosp-Admission (Discharged) from 11/20/2018 in [facility]STE[facility] Most recent reading at 11/20/2018  5:00 PM  C-SSRS RISK CATEGORY High Risk High Risk No Risk        Musculoskeletal  Strength & Muscle Tone: within normal limits Gait & Station: normal Patient leans: N/A  Psychiatric Specialty Exam  Presentation  General Appearance: Appropriate for Environment; Casual  Eye Contact:Good  Speech:Clear and Coherent; Normal Rate  Speech Volume:Normal  Handedness:Right   Mood and Affect  Mood:Depressed  Affect:Congruent   Thought Process  Thought Processes:Coherent  Descriptions of Associations:Intact  Orientation:Full (Time, Place and Person)  Thought Content:Logical  Diagnosis of Schizophrenia or Schizoaffective disorder in past: No    Hallucinations:Hallucinations: None  Ideas of Reference:None  Suicidal Thoughts:Suicidal Thoughts: No  Homicidal Thoughts:Homicidal Thoughts: No   Sensorium  Memory:Immediate Good; Recent Good; Remote Good  Judgment:Fair  Insight:Good   Executive Functions  Concentration:Good  Attention Span:Good  [place] of Knowledge:Good  Language:Good   Psychomotor Activity  Psychomotor Activity:Psychomotor Activity: Normal   Assets  Assets:Communication Skills; Desire for Improvement; Financial Resources/Insurance; Physical Health; Resilience; Social Support   Sleep  Sleep:Sleep: Fair   No data recorded  Physical Exam  Physical Exam Vitals and nursing note reviewed.  Constitutional:      General: She is not in acute distress.    Appearance: Normal appearance. She is not ill-appearing.  HENT:     Head: Normocephalic.  Eyes:     General:        Right eye: No discharge.        Left eye: No discharge.     Conjunctiva/sclera:  Conjunctivae normal.     Pupils: Pupils are equal, round, and reactive to light.  Cardiovascular:     Rate and Rhythm: Normal rate.  Pulmonary:     Effort: Pulmonary effort is normal.  Musculoskeletal:        General: Normal range of motion.     Cervical back: Normal range of motion.  Skin:    General: Skin is warm and dry.     Coloration: Skin is not jaundiced or pale.  Neurological:     Mental  Status: She is alert and oriented to person, place, and time.  Psychiatric:        Attention and Perception: Attention normal.        Mood and Affect: Mood is depressed.        Speech: Speech normal.        Behavior: Behavior normal. Behavior is cooperative.        Thought Content: Thought content normal.        Cognition and Memory: Cognition normal.        Judgment: Judgment normal.   Review of Systems  Constitutional: Negative.   HENT: Negative.    Eyes: Negative.   Respiratory: Negative.    Cardiovascular: Negative.   Musculoskeletal: Negative.   Skin: Negative.   Neurological: Negative.   Psychiatric/Behavioral:  Positive for depression.   Blood pressure (!) 87/62, pulse (!) 54, temperature 98.6 F (37 C), temperature source Tympanic, resp. rate 16, SpO2 97 %. There is no height or weight on file to calculate BMI.  Treatment Plan Summary:  Disposition ongoing: Patient continues to meet criteria for treatment in the [facility].  No medication changes at this time.  Daily contact with patient to assess and evaluate symptoms and progress in treatment and Medication management.  Upon discharge: Patient ready to follow-up with methadone clinic.  Prescription will not be provided upon discharge.  Revonda Humphrey, NP 11/09/2021 8:51 AM

## 2021-11-09 NOTE — Progress Notes (Signed)
Per nursing report, BP 87/62 this AM.  Discussed BP and medications with Eber Jones, NP.  Per NP, encourage fluids and continue to monitor.

## 2021-11-09 NOTE — Progress Notes (Signed)
Patient sitting in dayroom watching television.  Continue to monitor for safety. °

## 2021-11-09 NOTE — ED Notes (Signed)
Pt is currently on the phone. 

## 2021-11-09 NOTE — Progress Notes (Signed)
Diane Foley slept throughout the night without incident.

## 2021-11-09 NOTE — ED Notes (Signed)
Pt eating breakfast 

## 2021-11-09 NOTE — ED Notes (Signed)
Snacks given 

## 2021-11-10 DIAGNOSIS — F1410 Cocaine abuse, uncomplicated: Secondary | ICD-10-CM | POA: Diagnosis not present

## 2021-11-10 DIAGNOSIS — F332 Major depressive disorder, recurrent severe without psychotic features: Secondary | ICD-10-CM | POA: Diagnosis not present

## 2021-11-10 DIAGNOSIS — F17210 Nicotine dependence, cigarettes, uncomplicated: Secondary | ICD-10-CM | POA: Diagnosis not present

## 2021-11-10 DIAGNOSIS — R45851 Suicidal ideations: Secondary | ICD-10-CM | POA: Diagnosis not present

## 2021-11-10 NOTE — ED Notes (Signed)
Pt is currently sleeping, no distress noted, environmental check complete, will continue to monitor patient for safety. ? ?

## 2021-11-10 NOTE — ED Provider Notes (Signed)
Behavioral Health Progress Note  Date and Time: 11/10/2021 12:10 PM Name: Diane Foley MRN:  [id number]  Subjective:   Diane Foley, 35 y.o., female patient seen face-to-face by this provider, chart reviewed and discussed with Dr. Dwyane Dee on 11/10/2021.  Patient reports a history of MDD and opioid use.  She is currently on MAT treatment.  Reports recent break-up with boyfriend, recent miscarriage and relapsing on crack cocaine and heroin prior to admission patient was transferred from [facility] emergency department to the [facility] for treatment on 11/06/2021.  During evaluation Diane Foley is in sitting position in the day room talking with another patient.  She makes fair eye contact.  Speech is at moderate pace and tone.  She is alert/oriented and cooperative.  States, "I am not doing that great today".  She endorses depression with congruent affect.  Reports, "I need more time I know if I leave here I will go back to doing the same thing".  States she needs more help than what she is getting.  She would like a longer term treatment facility.  Reports her family is in agreement.  Discussed residential treatment center and halfway houses.  Patient states she does not think she would get the treatment that she needed at these facilities  She is denying suicidal ideations.  She endorses homicidal ideations towards her ex-boyfriend and his new girlfriend who is pregnant.  States, "I have never had these kind of thoughts of wanting to hurt someone before".  States she does not think that she would actually  hurt them, but if she is using she might.  She denies auditory/visual hallucinations.  She did not sleep well last night, states she has too much on her mind.  Denies any health concerns.  She is tolerating medications with no adverse effects.  Discussed with patient that social worker would be consulted.  Also discussed that when she is discharged she will need to follow-up with the methadone clinic for  medication as she would not be provided a methadone prescription upon discharge.  Patient states she will need a specific form filled out for [facility] from a provider here providing the amount of methadone and schedule she is receiving.    Diagnosis:  Final diagnoses:  MDD (major depressive disorder), recurrent severe, without psychosis ([place])  Cocaine abuse ([place])    Total Time spent with patient: 30 minutes  Past Psychiatric History: See H&P Past Medical History:  Past Medical History:  Diagnosis Date   [redacted] weeks gestation of pregnancy 11/20/2018   Anxiety    Bipolar 1 disorder ([place])    bipolar   Cocaine abuse ([place])    Depression    Drug abuse ([place])    Opioid abuse ([place])    PTSD (post-traumatic stress disorder)    Sepsis(995.91)    Suicidal ideations    UTI (urinary tract infection)     Past Surgical History:  Procedure Laterality Date   APPENDECTOMY     Family History:  Family History  Problem Relation Age of Onset   Cancer Mother    Anxiety disorder Mother    Depression Mother    Hypertension Father    Anxiety disorder Father    Depression Father    Alcohol abuse Father    Alcohol abuse Maternal Grandfather    Diabetes Paternal Grandfather    Family Psychiatric  History: See H&P Social History:  Social History   Substance and Sexual Activity  Alcohol Use Not Currently   Alcohol/week:  1.0 standard drink   Types: 1 Cans of beer per week   Comment: occasionally     Social History   Substance and Sexual Activity  Drug Use Not Currently   Types: Heroin, IV, Cocaine, Other-see comments   Comment: last used in december 2019    Social History   Socioeconomic History   Marital status: Single    Spouse name: Not on file   Number of children: Not on file   Years of education: Not on file   Highest education level: Not on file  Occupational History   Not on file  Tobacco Use   Smoking status: Every Day    Packs/day: 0.25    Types:  Cigarettes   Smokeless tobacco: Never  Vaping Use   Vaping Use: Never used  Substance and Sexual Activity   Alcohol use: Not Currently    Alcohol/week: 1.0 standard drink    Types: 1 Cans of beer per week    Comment: occasionally   Drug use: Not Currently    Types: Heroin, IV, Cocaine, Other-see comments    Comment: last used in december 2019   Sexual activity: Not Currently    Birth control/protection: None  Other Topics Concern   Not on file  Social History Narrative   ** Merged History Encounter **       Social Determinants of Health   Financial Resource Strain: Not on file  Food Insecurity: Not on file  Transportation Needs: Not on file  Physical Activity: Not on file  Stress: Not on file  Social Connections: Not on file   SDOH:  SDOH Screenings   Alcohol Screen: Not on file  Depression (PHQ2-9): Not on file  Financial Resource Strain: Not on file  Food Insecurity: Not on file  Housing: Not on file  Physical Activity: Not on file  Social Connections: Not on file  Stress: Not on file  Tobacco Use: High Risk   Smoking Tobacco Use: Every Day   Smokeless Tobacco Use: Never   Passive Exposure: Not on file  Transportation Needs: Not on file   Additional Social History:                         Sleep: Fair  Appetite:  Fair  Current Medications:  Current Facility-Administered Medications  Medication Dose Route Frequency Provider Last Rate Last Admin   acetaminophen (TYLENOL) tablet 650 mg  650 mg Oral Q6H PRN Lucky Rathke, FNP       alum & mag hydroxide-simeth (MAALOX/MYLANTA) 200-200-20 MG/5ML suspension 30 mL  30 mL Oral Q4H PRN Lucky Rathke, FNP       escitalopram (LEXAPRO) tablet 20 mg  20 mg Oral Daily Ival Bible, MD   20 mg at 11/10/21 0908   hydrOXYzine (ATARAX) tablet 25 mg  25 mg Oral TID PRN Lucky Rathke, FNP       magnesium hydroxide (MILK OF MAGNESIA) suspension 30 mL  30 mL Oral Daily PRN Lucky Rathke, FNP       methadone  (DOLOPHINE) tablet 55 mg  55 mg Oral Daily Ival Bible, MD   55 mg at 11/10/21 0908   traZODone (DESYREL) tablet 100 mg  100 mg Oral QHS Ethelene Hal, NP   100 mg at 11/09/21 2112   Current Outpatient Medications  Medication Sig Dispense Refill   escitalopram (LEXAPRO) 10 MG tablet Take 10 mg by mouth at bedtime.  methadone (DOLOPHINE) 10 MG/ML solution Take 55 mg by mouth daily.     traZODone (DESYREL) 50 MG tablet Take 100 mg by mouth at bedtime.      Labs  Lab Results:  Admission on 11/06/2021  Component Date Value Ref Range Status   POC Amphetamine UR 11/07/2021 None Detected  NONE DETECTED (Cut Off Level 1000 ng/mL) Final   POC Secobarbital (BAR) 11/07/2021 None Detected  NONE DETECTED (Cut Off Level 300 ng/mL) Final   POC Buprenorphine (BUP) 11/07/2021 None Detected  NONE DETECTED (Cut Off Level 10 ng/mL) Final   POC Oxazepam (BZO) 11/07/2021 None Detected  NONE DETECTED (Cut Off Level 300 ng/mL) Final   POC Cocaine UR 11/07/2021 Positive (A)  NONE DETECTED (Cut Off Level 300 ng/mL) Final   POC Methamphetamine UR 11/07/2021 None Detected  NONE DETECTED (Cut Off Level 1000 ng/mL) Final   POC Morphine 11/07/2021 Positive (A)  NONE DETECTED (Cut Off Level 300 ng/mL) Final   POC Oxycodone UR 11/07/2021 None Detected  NONE DETECTED (Cut Off Level 100 ng/mL) Final   POC Methadone UR 11/07/2021 Positive (A)  NONE DETECTED (Cut Off Level 300 ng/mL) Final   POC Marijuana UR 11/07/2021 None Detected  NONE DETECTED (Cut Off Level 50 ng/mL) Final  Admission on 11/06/2021, Discharged on 11/06/2021  Component Date Value Ref Range Status   SARS Coronavirus 2 by RT PCR 11/06/2021 NEGATIVE  NEGATIVE Final   Comment: (NOTE) SARS-CoV-2 target nucleic acids are NOT DETECTED.  The SARS-CoV-2 RNA is generally detectable in upper respiratory specimens during the acute phase of infection. The lowest concentration of SARS-CoV-2 viral copies this assay can detect is 138 copies/mL. A  negative result does not preclude SARS-Cov-2 infection and should not be used as the sole basis for treatment or other patient management decisions. A negative result may occur with  improper specimen collection/handling, submission of specimen other than nasopharyngeal swab, presence of viral mutation(s) within the areas targeted by this assay, and inadequate number of viral copies(<138 copies/mL). A negative result must be combined with clinical observations, patient history, and epidemiological information. The expected result is Negative.  Fact Sheet for Patients:  BloggerCourse.com  Fact Sheet for Healthcare Providers:  SeriousBroker.it  This test is no                          t yet approved or cleared by the [place] FDA and  has been authorized for detection and/or diagnosis of SARS-CoV-2 by FDA under an Emergency Use Authorization (EUA). This EUA will remain  in effect (meaning this test can be used) for the duration of the COVID-19 declaration under Section 564(b)(1) of the Act, 21 U.S.C.section 360bbb-3(b)(1), unless the authorization is terminated  or revoked sooner.       Influenza A by PCR 11/06/2021 NEGATIVE  NEGATIVE Final   Influenza B by PCR 11/06/2021 NEGATIVE  NEGATIVE Final   Comment: (NOTE) The Xpert Xpress SARS-CoV-2/FLU/RSV plus assay is intended as an aid in the diagnosis of influenza from Nasopharyngeal swab specimens and should not be used as a sole basis for treatment. Nasal washings and aspirates are unacceptable for Xpert Xpress SARS-CoV-2/FLU/RSV testing.  Fact Sheet for Patients: BloggerCourse.com  Fact Sheet for Healthcare Providers: SeriousBroker.it  This test is not yet approved or cleared by the [place] FDA and has been authorized for detection and/or diagnosis of SARS-CoV-2 by FDA under an Emergency Use Authorization (EUA). This  EUA will remain in effect (  meaning this test can be used) for the duration of the COVID-19 declaration under Section 564(b)(1) of the Act, 21 U.S.C. section 360bbb-3(b)(1), unless the authorization is terminated or revoked.  Performed at [facility], [address] [place], [place] [address]    Sodium 11/06/2021 137  135 - 145 mmol/L Final   Potassium 11/06/2021 3.2 (L)  3.5 - 5.1 mmol/L Final   Chloride 11/06/2021 99  98 - 111 mmol/L Final   CO2 11/06/2021 28  22 - 32 mmol/L Final   Glucose, Bld 11/06/2021 102 (H)  70 - 99 mg/dL Final   Glucose reference range applies only to samples taken after fasting for at least 8 hours.   BUN 11/06/2021 8  6 - 20 mg/dL Final   Creatinine, Ser 11/06/2021 0.62  0.44 - 1.00 mg/dL Final   Calcium 99/83/3825 8.9  8.9 - 10.3 mg/dL Final   Total Protein 05/39/7673 7.0  6.5 - 8.1 g/dL Final   Albumin 41/93/7902 3.6  3.5 - 5.0 g/dL Final   AST 40/97/3532 38  15 - 41 U/L Final   ALT 11/06/2021 41  0 - 44 U/L Final   Alkaline Phosphatase 11/06/2021 65  38 - 126 U/L Final   Total Bilirubin 11/06/2021 0.3  0.3 - 1.2 mg/dL Final   GFR, Estimated 11/06/2021 >60  >60 mL/min Final   Comment: (NOTE) Calculated using the CKD-EPI Creatinine Equation (2021)    Anion gap 11/06/2021 10  5 - 15 Final   Performed at [facility], [address] [place], [place] [address]   Alcohol, Ethyl (B) 11/06/2021 <10  <10 mg/dL Final   Comment: (NOTE) Lowest detectable limit for serum alcohol is 10 mg/dL.  For medical purposes only. Performed at [facility], [address], [place], [place] [address]    WBC 11/06/2021 5.0  4.0 - 10.5 K/uL Final   RBC 11/06/2021 3.91  3.87 - 5.11 MIL/uL Final   Hemoglobin 11/06/2021 9.9 (L)  12.0 - 15.0 g/dL Final   HCT 96/22/2979 31.7 (L)  36.0 - 46.0 % Final   MCV 11/06/2021 81.1  80.0 - 100.0 fL Final   MCH 11/06/2021 25.3 (L)  26.0 - 34.0 pg Final   MCHC 11/06/2021 31.2   30.0 - 36.0 g/dL Final   RDW 89/[birth year]/1941 16.7 (H)  11.5 - 15.5 % Final   Platelets 11/06/2021 354  150 - 400 K/uL Final   nRBC 11/06/2021 0.0  0.0 - 0.2 % Final   Neutrophils Relative % 11/06/2021 62  % Final   Neutro Abs 11/06/2021 3.1  1.7 - 7.7 K/uL Final   Lymphocytes Relative 11/06/2021 27  % Final   Lymphs Abs 11/06/2021 1.3  0.7 - 4.0 K/uL Final   Monocytes Relative 11/06/2021 8  % Final   Monocytes Absolute 11/06/2021 0.4  0.1 - 1.0 K/uL Final   Eosinophils Relative 11/06/2021 3  % Final   Eosinophils Absolute 11/06/2021 0.1  0.0 - 0.5 K/uL Final   Basophils Relative 11/06/2021 0  % Final   Basophils Absolute 11/06/2021 0.0  0.0 - 0.1 K/uL Final   Immature Granulocytes 11/06/2021 0  % Final   Abs Immature Granulocytes 11/06/2021 0.01  0.00 - 0.07 K/uL Final   Performed at [facility], [address], [place], [place] [address]   I-stat hCG, quantitative 11/06/2021 <5.0  <5 mIU/mL Final   Comment 3 11/06/2021          Final  Comment:   GEST. AGE      CONC.  (mIU/mL)   <=1 WEEK        5 - 50     2 WEEKS       50 - 500     3 WEEKS       100 - 10,000     4 WEEKS     1,000 - 30,000        FEMALE AND NON-PREGNANT FEMALE:     LESS THAN 5 mIU/mL    Salicylate Lvl 99991111 <7.0 (L)  7.0 - 30.0 mg/dL Final   Performed at [facility], [place] [address], [place], [place] [address]   Acetaminophen (Tylenol), Serum 11/06/2021 <10 (L)  10 - 30 ug/mL Final   Comment: (NOTE) Therapeutic concentrations vary significantly. A range of 10-30 ug/mL  may be an effective concentration for many patients. However, some  are best treated at concentrations outside of this range. Acetaminophen concentrations >150 ug/mL at 4 hours after ingestion  and >50 ug/mL at 12 hours after ingestion are often associated with  toxic reactions.  Performed at [facility], [place] [address], [place], [place] [address]     Blood Alcohol level:  Lab Results   Component Value Date   ETH <10 11/06/2021   ETH <10 0000000    Metabolic Disorder Labs: No results found for: HGBA1C, MPG No results found for: PROLACTIN No results found for: CHOL, TRIG, HDL, CHOLHDL, VLDL, LDLCALC  Therapeutic Lab Levels: No results found for: LITHIUM No results found for: VALPROATE No components found for:  CBMZ  Physical Findings   AUDIT    Flowsheet Row Admission (Discharged) from 02/09/2015 in [facility]  Alcohol Use Disorder Identification Test Final Score (AUDIT) 2      GAD-7    Flowsheet Row Routine Prenatal from 01/25/2019 in [facility] for [facility]  Total GAD-7 Score 13      PHQ2-9    Flowsheet Row Routine Prenatal from 01/25/2019 in [place] for [facility]  PHQ-2 Total Score 4  PHQ-9 Total Score  [facility] from 11/06/2021 in [facility] Most recent reading at 11/06/2021 10:52 PM ED from 11/06/2021 in [facility] DEPT Most recent reading at 11/06/2021  2:00 PM ED to Hosp-Admission (Discharged) from 11/20/2018 in [facility] Most recent reading at 11/20/2018  5:00 PM  C-SSRS RISK CATEGORY High Risk High Risk No Risk        Musculoskeletal  Strength & Muscle Tone: within normal limits Gait & Station: normal Patient leans: N/A  Psychiatric Specialty Exam  Presentation  General Appearance: Appropriate for Environment; Casual  Eye Contact:Fair  Speech:Clear and Coherent; Normal Rate  Speech Volume:Normal  Handedness:Right   Mood and Affect  Mood:Depressed  Affect:Congruent   Thought Process  Thought Processes:Coherent  Descriptions of Associations:Intact  Orientation:Full (Time, Place and Person)  Thought Content:Logical  Diagnosis of Schizophrenia or Schizoaffective disorder in past: No    Hallucinations:Hallucinations: None  Ideas of  Reference:None  Suicidal Thoughts:Suicidal Thoughts: No  Homicidal Thoughts:Homicidal Thoughts: Yes, Passive HI Passive Intent and/or Plan: Without Intent; Without Plan; Without Means to Carry Out   Sensorium  Memory:Immediate Good; Recent Good; Remote Good  Judgment:Fair  Insight:Fair   Executive Functions  Concentration:Good  Attention Span:Good  Recall:Good  Fund of Knowledge:Good  Language:Good   Psychomotor Activity  Psychomotor Activity:Psychomotor Activity: Normal  Assets  Assets:Communication Skills; Desire for Improvement; Leisure Time; Physical Health; Social Support   Sleep  Sleep:Sleep: Fair Number of Hours of Sleep: 6   No data recorded  Physical Exam  Physical Exam Vitals and nursing note reviewed.  Constitutional:      General: She is not in acute distress.    Appearance: Normal appearance. She is not ill-appearing.  HENT:     Head: Normocephalic.  Eyes:     General:        Right eye: No discharge.        Left eye: No discharge.     Conjunctiva/sclera: Conjunctivae normal.  Cardiovascular:     Rate and Rhythm: Normal rate.  Pulmonary:     Effort: Pulmonary effort is normal. No respiratory distress.  Musculoskeletal:        General: Normal range of motion.     Cervical back: Normal range of motion.  Skin:    Coloration: Skin is not jaundiced or pale.  Neurological:     Mental Status: She is alert and oriented to person, place, and time.  Psychiatric:        Attention and Perception: Attention and perception normal.        Mood and Affect: Mood is depressed.        Speech: Speech normal.        Behavior: Behavior normal. Behavior is cooperative.        Thought Content: Thought content includes homicidal ideation. Thought content does not include homicidal plan.        Cognition and Memory: Cognition normal.        Judgment: Judgment is impulsive.   Review of Systems  Constitutional: Negative.   HENT: Negative.    Eyes:  Negative.   Respiratory: Negative.    Cardiovascular: Negative.   Musculoskeletal: Negative.   Skin: Negative.   Neurological: Negative.   Psychiatric/Behavioral:  Positive for depression.   Blood pressure 101/62, pulse 60, temperature 98.4 F (36.9 C), temperature source Oral, resp. rate 17, SpO2 99 %. There is no height or weight on file to calculate BMI.  Treatment Plan Summary:  Disposition: ongoing.  Patient continues to meet criteria for treatment in the [facility].  No medication changes at this time.  Patient is interested in a "more long-term facility".  Social worker notified via secure chat.  Patient does not believe that residential treatment facility or a halfway house is a good fit for her.  Daily contact with patient to assess and evaluate symptoms and progress in treatment and Medication management  Patient informed upon discharge: Patient ready to follow-up with methadone clinic.  Prescription will not be provided upon discharge  Revonda Humphrey, NP 11/10/2021 12:10 PM

## 2021-11-10 NOTE — ED Notes (Signed)
Snack given.

## 2021-11-10 NOTE — ED Notes (Signed)
Patient is calm and cooperative but concerned about where she will be going when she leaves here. She has fears that another facility may  discontinue her current methadone dose. She took a Vistaril for anxiey at 8:30 p.m. Will continue to monitor for safety

## 2021-11-10 NOTE — Progress Notes (Signed)
Pt is asleep. Respirations are even and unlabored. No signs of acute distress noted. Staff will monitor for pt's safety. 

## 2021-11-10 NOTE — Progress Notes (Signed)
Pt is awake, alert and oriented. Pt did not voice any complaints of pain or discomfort. No signs of acute distress noted. Administered scheduled meds with no incident. Pt denies current SI/HI/AVH. 15 minutes checks in effect. Staff will monitor for pt's safety.

## 2021-11-10 NOTE — ED Notes (Signed)
Patient is resting quietly with eyes closed. No S/S of distress, breathing is unlabored , will continue to monitor for safety.

## 2021-11-10 NOTE — ED Notes (Signed)
Group worksheet given 

## 2021-11-10 NOTE — Progress Notes (Signed)
Pt's BP rechecked and it is WDL. Will continue to monitor.

## 2021-11-10 NOTE — Progress Notes (Signed)
Pt is watching TV and eating dinner quietly. No distress noted. Monitoring for pt's safety.

## 2021-11-10 NOTE — ED Notes (Signed)
Next shift nurse advised of patients low BP.

## 2021-11-11 DIAGNOSIS — F332 Major depressive disorder, recurrent severe without psychotic features: Secondary | ICD-10-CM | POA: Diagnosis not present

## 2021-11-11 DIAGNOSIS — F1410 Cocaine abuse, uncomplicated: Secondary | ICD-10-CM | POA: Diagnosis not present

## 2021-11-11 DIAGNOSIS — R45851 Suicidal ideations: Secondary | ICD-10-CM | POA: Diagnosis not present

## 2021-11-11 DIAGNOSIS — F17210 Nicotine dependence, cigarettes, uncomplicated: Secondary | ICD-10-CM | POA: Diagnosis not present

## 2021-11-11 MED ORDER — TRAZODONE HCL 50 MG PO TABS
100.0000 mg | ORAL_TABLET | Freq: Every day | ORAL | 0 refills | Status: AC
Start: 2021-11-11 — End: ?

## 2021-11-11 MED ORDER — ESCITALOPRAM OXALATE 20 MG PO TABS: 20.0000 mg | ORAL_TABLET | Freq: Every day | ORAL | 0 refills | Status: AC

## 2021-11-11 NOTE — ED Notes (Signed)
Pt notified of appointment at [facility]  tomorrow ( December 6th, 2022) at 2pm for an assessment for Intensive Outpatient Therapy.Pt verbalized understanding. Pt information was faxed to [facility].

## 2021-11-11 NOTE — ED Notes (Signed)
Patient resting quietly with eyes closed. No S/S of distress. Respirations even and unlabored. Will continue to monitor for safety. °

## 2021-11-11 NOTE — ED Notes (Signed)
She attended AA group 

## 2021-11-11 NOTE — ED Notes (Signed)
Pt spoke with the provider about her discharge plan. Pt stated that she is willing to participate in intensive outpatient therapy and that she feels safe going home today with her family.

## 2021-11-11 NOTE — Progress Notes (Signed)
Patient received After Visit Summary given with follow up instructions. Patient also received a copy of Medication Administration Record at her request which has time and date of all medications taken since admission. Patient received all belongings from [facility] locker. Nursing. Sample medications given as well as prescription was sent to [facility] by provider. At discharge patient denies SI, HI and AVH.

## 2021-11-11 NOTE — Discharge Instructions (Addendum)
Take all medications as prescribed by his/her mental healthcare provider. Report any adverse effects and or reactions from the medicines to his/her outpatient provider promptly. Patient has been instructed & cautioned: To not engage in alcohol and or illegal drug use while on prescription medicines. In the event of worsening symptoms, to call the crisis hotline, 911 and or go to the nearest ED for appropriate evaluation and treatment of symptoms. Follow-up with your primary care provider for your other medical issues, concerns and or health care needs.      Please come to [facility] (this facility) during walk in hours for appointment with psychiatrist for further medication management and for therapists for therapy.    Walk in hours are 8-11 AM Monday through Thursday for medication management.Therapy walk in hours are Monday-Wednesday 8 AM-1PM.   It is first come, first -serve; it is best to arrive by 7:00 AM.   On Friday from 1 pm to 4 pm for therapy intake only. Please arrive by 12:00 pm as it is  first come, first -serve.    When you arrive please go upstairs for your appointment. If you are unsure of where to go, inform the front desk that you are here for a walk in appointment and they will assist you with directions upstairs.  Address:  [address], in [place], [address] Ph: [phone]     Please arrive at [facility] at 2 pm Tuesday December 6th, 2022 for an assessment to begin Intensive Outpatient Therapy. Address: [address] [place], [place], [place] [address] [phone]

## 2021-11-11 NOTE — Progress Notes (Signed)
Patient resting in room with even respirations, no objective signs of discomfort at this time. Nursing staff will continue to monitor.

## 2021-11-11 NOTE — ED Provider Notes (Signed)
FBC/OBS ASAP Discharge Summary  Date and Time: 11/11/2021 2:16 PM  Name: Diane Foley  MRN:  [id number]   Discharge Diagnoses:  Final diagnoses:  MDD (major depressive disorder), recurrent severe, without psychosis (HCC)  Cocaine abuse (HCC)    Subjective:  Patient seen and chart reviewed.  She has been medication compliant, been appropriate with staff and peers on the unit and has been attending some groups.  Patient interviewed in her room this morning.  Patient's affect appears much brighter than previously; she is making jokes and laughing.  Patient describes her mood as "okay".  She reports sleeping well and states that her weekend was "okay".  She denies SI/AVH.  She denies HI this morning.  When asked about her previous report of HI yesterday; patient states that "it is not something I will go through with it" and describes feeling frustrated with her situation.  She cites her daughter as a reason she would never harm anyone as she would like to be there for her daughter and not go to jail.  Patient states that her mother would like her to come off methadone; however, she indicates that she does not feel that she is ready for this.  Patient reports that her urge to use is "stronger" than it had been in a while and she does not feel that it would be a good time to come off of methadone at this time.  Support and encouragement provided.  Discussed with patient that while she is on methadone she would not be eligible to attend any substance use treatment programs-patient verbalized understanding.  Discussed intensive outpatient program with patient and she is amenable.  Social work was consulted for additional resources-[facility] has intensive outpatient program for substance use.  Patient is able to contract for safety.  Stay Summary:  35 year old female with history of MDD, opioid use currently on medication assisted treatment (methadone) who presented to [facility] with suicidal thoughts on 11/30  in the context of conflict with ex-boyfriend , recent miscarriage and relapsing on crack cocaine and heroin for a couple to use prior to presentation.  Patient was transferred to [facility], ED for medical clearance and then accepted to the [facility] for further treatment.  Prior to admission psych NP confirmed that patient was receiving methadone 55 mg daily from [facility].  On admission patient was restarted on methadone 55 mg daily after it was verified by pharmacy.  Patient was restarted on her home dose of Lexapro 10 mg which was increased to 20 mg on 12/2.  Patient tolerated medication well without side effects/adverse events.  Throughout her stay Diane Foley was appropriate with staff and peers on the unit was medication compliant; she attended most groups although did spend a lot of time isolated to her room throughout stay.  By day of discharge  on 12/5, patient no longer met criteria - she denied SI/HI/AVH.  Objectively patient appeared much brighter and was more engaged with peers and staff.  Patient was agreeable to discharge with outpatient follow-up.  Patient was discharged to self care on 12/5-social work was consulted for assistance for outpatient resources-substance use IOP/PHP-[facility] information was placed in AVS.  Total Time spent with patient: 15 minutes  Past Psychiatric History: MDD, opioid use  Past Medical History:  Past Medical History:  Diagnosis Date   [redacted] weeks gestation of pregnancy 11/20/2018   Anxiety    Bipolar 1 disorder (HCC)    bipolar   Cocaine abuse (HCC)  Depression    Drug abuse ([place])    Opioid abuse ([place])    PTSD (post-traumatic stress disorder)    Sepsis(995.91)    Suicidal ideations    UTI (urinary tract infection)     Past Surgical History:  Procedure Laterality Date   APPENDECTOMY     Family History:  Family History  Problem Relation Age of Onset   Cancer Mother    Anxiety disorder Mother    Depression Mother    Hypertension Father    Anxiety  disorder Father    Depression Father    Alcohol abuse Father    Alcohol abuse Maternal Grandfather    Diabetes Paternal Grandfather    Family Psychiatric History: denied Social History:  Social History   Substance and Sexual Activity  Alcohol Use Not Currently   Alcohol/week: 1.0 standard drink   Types: 1 Cans of beer per week   Comment: occasionally     Social History   Substance and Sexual Activity  Drug Use Not Currently   Types: Heroin, IV, Cocaine, Other-see comments   Comment: last used in december 2019    Social History   Socioeconomic History   Marital status: Single    Spouse name: Not on file   Number of children: Not on file   Years of education: Not on file   Highest education level: Not on file  Occupational History   Not on file  Tobacco Use   Smoking status: Every Day    Packs/day: 0.25    Types: Cigarettes   Smokeless tobacco: Never  Vaping Use   Vaping Use: Never used  Substance and Sexual Activity   Alcohol use: Not Currently    Alcohol/week: 1.0 standard drink    Types: 1 Cans of beer per week    Comment: occasionally   Drug use: Not Currently    Types: Heroin, IV, Cocaine, Other-see comments    Comment: last used in december 2019   Sexual activity: Not Currently    Birth control/protection: None  Other Topics Concern   Not on file  Social History Narrative   ** Merged History Encounter **       Social Determinants of Health   Financial Resource Strain: Not on file  Food Insecurity: Not on file  Transportation Needs: Not on file  Physical Activity: Not on file  Stress: Not on file  Social Connections: Not on file   SDOH:  SDOH Screenings   Alcohol Screen: Not on file  Depression (PHQ2-9): Not on file  Financial Resource Strain: Not on file  Food Insecurity: Not on file  Housing: Not on file  Physical Activity: Not on file  Social Connections: Not on file  Stress: Not on file  Tobacco Use: High Risk   Smoking Tobacco Use:  Every Day   Smokeless Tobacco Use: Never   Passive Exposure: Not on file  Transportation Needs: Not on file    Tobacco Cessation:  Prescription not provided because: patient declined  Current Medications:  Current Facility-Administered Medications  Medication Dose Route Frequency Provider Last Rate Last Admin   acetaminophen (TYLENOL) tablet 650 mg  650 mg Oral Q6H PRN Lucky Rathke, FNP       alum & mag hydroxide-simeth (MAALOX/MYLANTA) 200-200-20 MG/5ML suspension 30 mL  30 mL Oral Q4H PRN Lucky Rathke, FNP       escitalopram (LEXAPRO) tablet 20 mg  20 mg Oral Daily Ival Bible, MD   20 mg at 11/11/21 [id number]  hydrOXYzine (ATARAX) tablet 25 mg  25 mg Oral TID PRN Lucky Rathke, FNP   25 mg at 11/10/21 2035   magnesium hydroxide (MILK OF MAGNESIA) suspension 30 mL  30 mL Oral Daily PRN Lucky Rathke, FNP       methadone (DOLOPHINE) tablet 55 mg  55 mg Oral Daily Ival Bible, MD   55 mg at 11/11/21 5625   traZODone (DESYREL) tablet 100 mg  100 mg Oral QHS Ethelene Hal, NP   100 mg at 11/10/21 2101   Current Outpatient Medications  Medication Sig Dispense Refill   methadone (DOLOPHINE) 10 MG/ML solution Take 55 mg by mouth daily.     [START ON 11/12/2021] escitalopram (LEXAPRO) 20 MG tablet Take 1 tablet (20 mg total) by mouth daily. 30 tablet 0   traZODone (DESYREL) 50 MG tablet Take 2 tablets (100 mg total) by mouth at bedtime. 30 tablet 0    PTA Medications: (Not in a hospital admission)   Musculoskeletal  Strength & Muscle Tone: within normal limits Gait & Station: normal Patient leans: N/A  Psychiatric Specialty Exam  Presentation  General Appearance: Appropriate for Environment; Casual  Eye Contact:Fair  Speech:Clear and Coherent; Normal Rate  Speech Volume:Normal  Handedness:Right   Mood and Affect  Mood:-- ("ok")  Affect:Appropriate; Congruent; Other (comment) (euthymic, brighter than previously, full affect, laughing)   Thought  Process  Thought Processes:Coherent; Goal Directed; Linear  Descriptions of Associations:Intact  Orientation:Full (Time, Place and Person)  Thought Content:Logical; WDL  Diagnosis of Schizophrenia or Schizoaffective disorder in past: No    Hallucinations:Hallucinations: None  Ideas of Reference:None  Suicidal Thoughts:Suicidal Thoughts: No  Homicidal Thoughts:Homicidal Thoughts: No HI Passive Intent and/or Plan: Without Intent; Without Plan; Without Means to Carry Out   Sensorium  Memory:Immediate Good; Recent Good; Remote Good  Judgment:Fair  Insight:Fair   Executive Functions  Concentration:Good  Attention Span:Good  Recall:Good  Fund of Knowledge:Good  Language:Good   Psychomotor Activity  Psychomotor Activity:Psychomotor Activity: Normal   Assets  Assets:Communication Skills; Desire for Improvement; Leisure Time; Physical Health; Social Support   Sleep  Sleep:Sleep: Fair  No data recorded  Physical Exam  Physical Exam Constitutional:      Appearance: Normal appearance. She is normal weight.  HENT:     Head: Normocephalic and atraumatic.  Pulmonary:     Effort: Pulmonary effort is normal.  Neurological:     Mental Status: She is alert and oriented to person, place, and time.   Review of Systems  Constitutional:  Negative for chills and fever.  HENT:  Negative for hearing loss.   Eyes:  Negative for discharge and redness.  Respiratory:  Negative for cough.   Cardiovascular:  Negative for chest pain.  Gastrointestinal:  Negative for abdominal pain.  Musculoskeletal:  Negative for myalgias.  Neurological:  Negative for headaches.  Psychiatric/Behavioral:  Positive for substance abuse. Negative for depression, hallucinations and suicidal ideas.   Blood pressure (!) 82/56, pulse (!) 54, temperature 98.8 F (37.1 C), temperature source Oral, resp. rate 18, SpO2 100 %. There is no height or weight on file to calculate BMI.  Demographic Factors:   Caucasian, Low socioeconomic status, and Unemployed  Loss Factors: Loss of significant relationship, Financial problems/change in socioeconomic status, and recent miscarriage  Historical Factors: ? SA- patient denies but per chart review did report SA in the past  Risk Reduction Factors:   Responsible for children under 16 years of age, Sense of responsibility to family, Living with another  person, especially a relative, and Positive social support  Continued Clinical Symptoms:  Depression:   Comorbid alcohol abuse/dependence Alcohol/Substance Abuse/Dependencies Previous Psychiatric Diagnoses and Treatments  Cognitive Features That Contribute To Risk:  None    Suicide Risk:  Minimal: No identifiable suicidal ideation.  Patients presenting with no risk factors but with morbid ruminations; may be classified as minimal risk based on the severity of the depressive symptoms  Plan Of Care/Follow-up recommendations:  Activity:  as tolerated Diet:  regular Other:     Patient is instructed prior to discharge to: Take all medications as prescribed by his/her mental healthcare provider. Report any adverse effects and or reactions from the medicines to his/her outpatient provider promptly. Patient has been instructed & cautioned: To not engage in alcohol and or illegal drug use while on prescription medicines. In the event of worsening symptoms, patient is instructed to call the crisis hotline, 911 and or go to the nearest ED for appropriate evaluation and treatment of symptoms. To follow-up with his/her primary care provider for your other medical issues, concerns and or health care needs.   Medication List     TAKE these medications    escitalopram 20 MG tablet Commonly known as: LEXAPRO Take 1 tablet (20 mg total) by mouth daily. Start taking on: November 12, 2021 What changed:  medication strength how much to take when to take this   methadone 10 MG/ML solution Commonly known  as: DOLOPHINE Take 55 mg by mouth daily.   traZODone 50 MG tablet Commonly known as: DESYREL Take 2 tablets (100 mg total) by mouth at bedtime.       Patient provided with 7-day samples of Lexapro and trazodone.  She was also provided with a 30-day prescriptions that were sent to pharmacy of choice.  Advised patient to follow up at methadone clinic at [facility] in order to continue to receive; patient that she will not receive methadone prescription from providers at the [facility].  Social work consulted for assistance in terms of follow-up-patient provided with information for the [facility] for intensive outpatient substance use treatment.  Disposition: self care; home  Ival Bible, MD 11/11/2021, 2:16 PM

## 2021-11-11 NOTE — ED Notes (Signed)
Diane Foley spoke to her mom today on the phone after she went in the dining room had a snack and watched some tv she said she was okay she's just been trying to figure out a plan after getting out of here. She did say she was worried about them taking her off suboxone. Otherwise she has been really nice and not a problem. She has been sleeping ever since night meds. Will continue to do 15 min rounds on her.

## 2021-11-11 NOTE — Progress Notes (Signed)
Patient is alert and oriented X 3, denies SI, HI and AVH at this time. Patient will be discharging today to home.

## 2021-11-11 NOTE — ED Notes (Signed)
Pt participating and engaging appropriately in AA meeting.

## 2021-11-13 ENCOUNTER — Telehealth (HOSPITAL_COMMUNITY): Payer: Self-pay

## 2021-11-13 NOTE — BH Assessment (Signed)
Care Management - FBC Follow Up Discharges   Writer attempted to make contact with patient today and was unsuccessful.  Writer left a HIPPA compliant voice message.   Per chart review, patient was provided with outpatient resources.with the [facility] for substance abuse intensive outpatient group (SA-IOP)

## 2022-05-28 ENCOUNTER — Other Ambulatory Visit: Payer: Self-pay

## 2022-05-28 ENCOUNTER — Encounter (HOSPITAL_COMMUNITY): Payer: Self-pay | Admitting: Oncology

## 2022-05-28 ENCOUNTER — Emergency Department (HOSPITAL_COMMUNITY)
Admission: EM | Admit: 2022-05-28 | Discharge: 2022-05-29 | Disposition: A | Discharge status: Home / Self Care | Payer: Medicaid Other | Attending: Emergency Medicine | Admitting: Emergency Medicine

## 2022-05-28 ENCOUNTER — Emergency Department (HOSPITAL_COMMUNITY): Payer: Medicaid Other

## 2022-05-28 DIAGNOSIS — T40601A Poisoning by unspecified narcotics, accidental (unintentional), initial encounter: Secondary | ICD-10-CM

## 2022-05-28 DIAGNOSIS — T403X1A Poisoning by methadone, accidental (unintentional), initial encounter: Secondary | ICD-10-CM | POA: Insufficient documentation

## 2022-05-28 DIAGNOSIS — L0889 Other specified local infections of the skin and subcutaneous tissue: Secondary | ICD-10-CM | POA: Insufficient documentation

## 2022-05-28 DIAGNOSIS — L089 Local infection of the skin and subcutaneous tissue, unspecified: Secondary | ICD-10-CM

## 2022-05-28 LAB — BASIC METABOLIC PANEL
Anion gap: 8 (ref 5–15)
BUN: 12 mg/dL (ref 6–20)
CO2: 27 mmol/L (ref 22–32)
Calcium: 9.1 mg/dL (ref 8.9–10.3)
Chloride: 107 mmol/L (ref 98–111)
Creatinine, Ser: 0.74 mg/dL (ref 0.44–1.00)
GFR, Estimated: >60 mL/min (ref >60)
Glucose, Bld: 104 mg/dL — ABNORMAL HIGH (ref 70–99)
Potassium: 3.3 mmol/L — ABNORMAL LOW (ref 3.5–5.1)
Sodium: 142 mmol/L (ref 135–145)

## 2022-05-28 LAB — CBC
HCT: 38 % (ref 36.0–46.0)
Hemoglobin: 12.2 g/dL (ref 12.0–15.0)
MCH: 27.8 pg (ref 26.0–34.0)
MCHC: 32.1 g/dL (ref 30.0–36.0)
MCV: 86.6 fL (ref 80.0–100.0)
Platelets: 335 10*3/uL (ref 150–400)
RBC: 4.39 MIL/uL (ref 3.87–5.11)
RDW: 13.8 % (ref 11.5–15.5)
WBC: 10.1 10*3/uL (ref 4.0–10.5)
nRBC: 0 % (ref 0.0–0.2)

## 2022-05-28 LAB — ETHANOL: Alcohol, Ethyl (B): <10 mg/dL (ref <10)

## 2022-05-28 LAB — BLOOD GAS, VENOUS
Acid-Base Excess: 2.5 mmol/L — ABNORMAL HIGH (ref 0.0–2.0)
Bicarbonate: 28.8 mmol/L — ABNORMAL HIGH (ref 20.0–28.0)
O2 Saturation: 94.4 %
Patient temperature: 37
pCO2, Ven: 51 mmHg (ref 44–60)
pH, Ven: 7.36 (ref 7.25–7.43)
pO2, Ven: 69 mmHg — ABNORMAL HIGH (ref 32–45)

## 2022-05-28 LAB — I-STAT BETA HCG BLOOD, ED (MC, WL, AP ONLY): I-stat hCG, quantitative: <5 m[IU]/mL (ref <5)

## 2022-05-28 MED ORDER — ONDANSETRON HCL 4 MG/2ML IJ SOLN
4.0000 mg | Freq: Once | INTRAMUSCULAR | Status: DC
  Filled 2022-05-28: qty 2

## 2022-05-28 MED ORDER — SODIUM CHLORIDE 0.9 % IV SOLN
100.0000 mg | Freq: Two times a day (BID) | INTRAVENOUS | Status: DC
  Administered 2022-05-28: 100 mg via INTRAVENOUS
  Filled 2022-05-28: qty 100

## 2022-05-28 MED ORDER — DOXYCYCLINE HYCLATE 100 MG PO CAPS: 100.0000 mg | ORAL_CAPSULE | Freq: Two times a day (BID) | ORAL | 0 refills | Status: AC

## 2022-05-28 NOTE — ED Triage Notes (Addendum)
Pt bib [facility] from hotel s/p overdose of possible fentanyl. Per EMS pt was unresponsive prior to their arrival were told pt had turned purple and CPR had been initiated. Pt A&O x 4 upon EMS arrival. Pt reports to EMS being switched from methadone to suboxone 2 weeks ago. Did not tolerate and began to have withdrawal sx which led to her relapse. No narcan given.

## 2022-05-28 NOTE — ED Provider Notes (Signed)
[facility]-EMERGENCY DEPT Provider Note   CSN: [id number] Arrival date & time: 05/28/22  1832     History  Chief Complaint  Patient presents with   Drug Overdose    Diane Foley is a 36 y.o. female presenting to the emergency department status post drug overdose.  Paramedics report they were called to the scene as the patient was found in a hotel room unresponsive.  Bystanders had initiated CPR and the patient was "purple" in the face by the time they arrived.  Patient was subsequently able to wake up and speak to them, had reported that she had been switched off of methadone to Suboxone about 2 weeks ago.  She told EMS as well as me that she did not tolerate the switch from methadone and that she used fentanyl today because she was experiencing withdrawal symptoms from the Suboxone.  No Narcan was given in route to the hospital as the patient was awake and verbalizing rhythm.  She denies prior history of overdose.  HPI     Home Medications Prior to Admission medications   Medication Sig Start Date End Date Taking? Authorizing Provider  doxycycline (VIBRAMYCIN) 100 MG capsule Take 1 capsule (100 mg total) by mouth 2 (two) times daily for 7 days. 05/28/22 06/04/22 Yes Shanyiah Conde, Kermit Balo, MD  escitalopram (LEXAPRO) 20 MG tablet Take 1 tablet (20 mg total) by mouth daily. Patient not taking: Reported on 05/28/2022 11/12/21   Estella Husk, MD  methadone (DOLOPHINE) 10 MG/ML solution Take 55 mg by mouth daily. Patient not taking: Reported on 05/28/2022    [provider]  traZODone (DESYREL) 50 MG tablet Take 2 tablets (100 mg total) by mouth at bedtime. Patient not taking: Reported on 05/28/2022 11/11/21   Estella Husk, MD      Allergies    Patient has no known allergies.    Review of Systems   Review of Systems  Physical Exam Updated Vital Signs BP 121/81   Pulse 70   Temp 98.7 F (37.1 C) (Oral)   Resp 15   LMP  (LMP Unknown)   SpO2  96%  Physical Exam Constitutional:      Comments: Somnolent but awakens to voice  HENT:     Head: Normocephalic and atraumatic.  Eyes:     Conjunctiva/sclera: Conjunctivae normal.     Pupils: Pupils are equal, round, and reactive to light.     Comments: Pinpoint pupils  Cardiovascular:     Rate and Rhythm: Normal rate and regular rhythm.  Pulmonary:     Effort: Pulmonary effort is normal. No respiratory distress.  Abdominal:     General: There is no distension.     Tenderness: There is no abdominal tenderness.  Musculoskeletal:     Comments: Erythema and edema of the right thumb, with fluctuant pocket at the base of the fingernail, some tenderness over the pad of the thumb  Skin:    General: Skin is warm and dry.  Neurological:     General: No focal deficit present.     Mental Status: She is alert and oriented to person, place, and time. Mental status is at baseline.  Psychiatric:        Mood and Affect: Mood normal.        Behavior: Behavior normal.     ED Results / Procedures / Treatments   Labs (all labs ordered are listed, but only abnormal results are displayed) Labs Reviewed  BASIC METABOLIC PANEL - Abnormal;  Notable for the following components:      Result Value   Potassium 3.3 (*)    Glucose, Bld 104 (*)    All other components within normal limits  BLOOD GAS, VENOUS - Abnormal; Notable for the following components:   pO2, Ven 69 (*)    Bicarbonate 28.8 (*)    Acid-Base Excess 2.5 (*)    All other components within normal limits  CBC  ETHANOL  I-STAT BETA HCG BLOOD, ED ([facility], [facility], [facility] ONLY)    EKG None  Radiology DG Chest Portable 1 View  Result Date: 05/28/2022 CLINICAL DATA:  Evaluate for aspiration. EXAM: PORTABLE CHEST 1 VIEW COMPARISON:  Chest radiograph dated 11/20/2018. FINDINGS: The heart size and mediastinal contours are within normal limits. Both lungs are clear. The visualized skeletal structures are unremarkable. IMPRESSION: No active disease.  Electronically Signed   By: Elgie Collard M.D.   On: 05/28/2022 19:35    Procedures Procedures    Medications Ordered in ED Medications  ondansetron [facility]) injection 4 mg (4 mg Intravenous Not Given 05/28/22 2029)  doxycycline (VIBRAMYCIN) 100 mg in sodium chloride 0.9 % 250 mL IVPB (100 mg Intravenous New Bag/Given 05/28/22 2215)    ED Course/ Medical Decision Making/ A&P Clinical Course as of 05/28/22 2343  Wed May 28, 2022  2148 Patient is awake now, breathing comfortably on room air.  She is quite upset about the overdose at home.  She reports to me that it was a giant mistake, that she has never overdosed before "I should know better".  She says she is ashamed of herself.  She reports that she has a child, currently in the care of her parents, she needs to be better mother for.  She denies again that she is methadone.  She said this was Suboxone and fentanyl.  I do think at this point that she did not want to resume a long period of time to ensure no rebound opioid overdose, and her breathing is stable and her mental status is improved.  On my reassessment now, however, I do note that she has an infection of her right thumb, which likely appears to be a paronychia and possibly a felon, with some tenderness and swelling of the digit.  This does look like he could develop with a more serious infection, and I discussed with her IV antibiotics and bedside I&D, she does not want an I&D or medical admission.  She will take the antibiotics.  We will start her on doxycycline.  I gave her close return precautions and explained that if this infection is getting worse she needs to come back to one of the emergency department. [MT]    Clinical Course User Index [MT] Bekka Qian, Kermit Balo, MD                           Medical Decision Making Amount and/or Complexity of Data Reviewed Labs: ordered. Radiology: ordered.  Risk Prescription drug management.   This patient presents to the ED with  concern for opioid drug overdose/fentanyl overdose. This involves an extensive number of treatment options, and is a complaint that carries with it a high risk of complications and morbidity.   Co-morbidities that complicate the patient evaluation: chronic opioid use   Additional history obtained from EMS  External records from outside source obtained and reviewed including PDMP reviewed - no opioids prescriptions noted  I ordered and personally interpreted labs.  The pertinent results  include: No significant abnormality  I ordered imaging studies including dg chest I independently visualized and interpreted imaging which showed no focal I agree with the radiologist interpretation  The patient was maintained on a cardiac monitor.  I personally viewed and interpreted the cardiac monitored which showed an underlying rhythm of: NSR  Per my interpretation the patient's ECG shows NSR with prolonged QTC  I ordered medication including IV antibiotic for finger infection  Test Considered: doubt acute PE or ICH - did not feel emergent CT imaging indicated at this time  After the interventions noted above, I reevaluated the patient and found that they have: improved  Social Determinants of Health:Counseled on the dangers of drug use and overdose, which could have lead to death.  Dispostion:  After consideration of the diagnostic results and the patients response to treatment, I feel that the patent would benefit from outpatient referral for substance use, resources provided at the time of discharge.  Also discussed close return precautions worsening finger infection         Final Clinical Impression(s) / ED Diagnoses Final diagnoses:  Opiate overdose, accidental or unintentional, initial encounter (HCC)  Finger infection    Rx / DC Orders ED Discharge Orders          Ordered    doxycycline (VIBRAMYCIN) 100 MG capsule  2 times daily        05/28/22 2306               Terald Sleeper, MD 05/28/22 2344

## 2022-05-28 NOTE — Discharge Instructions (Addendum)
You are treated in the ER after an overdose of opioids.  This was a dangerous, potentially life-threatening situation.  You could have died from this overdose.  You were monitored into your awake and sober enough to leave.  I strongly recommend you avoid using any opiate medications that are not prescribed.  I included a list of resources if you are looking for detox or treatment centers.  I strongly encourage you to reach out to family or friends or anyone who can support you through this time.  We also talked about the infection in your thumb.  This infection is fairly concerning.  I started you on antibiotics, but antibiotics alone may not be enough to finish treating this infection.  If the pain and redness are getting worse, especially after 24 hours, please come back to the emergency room.  You may need a surgical procedure to drain the infection.

## 2022-11-27 IMAGING — DX DG CHEST 1V PORT
1 series · 1 of 1 positions shown · non-contrast
Comparison: Chest radiograph dated 11/20/2018.

CLINICAL DATA: Evaluate for aspiration.

EXAM:
PORTABLE CHEST 1 VIEW

[chest ap]
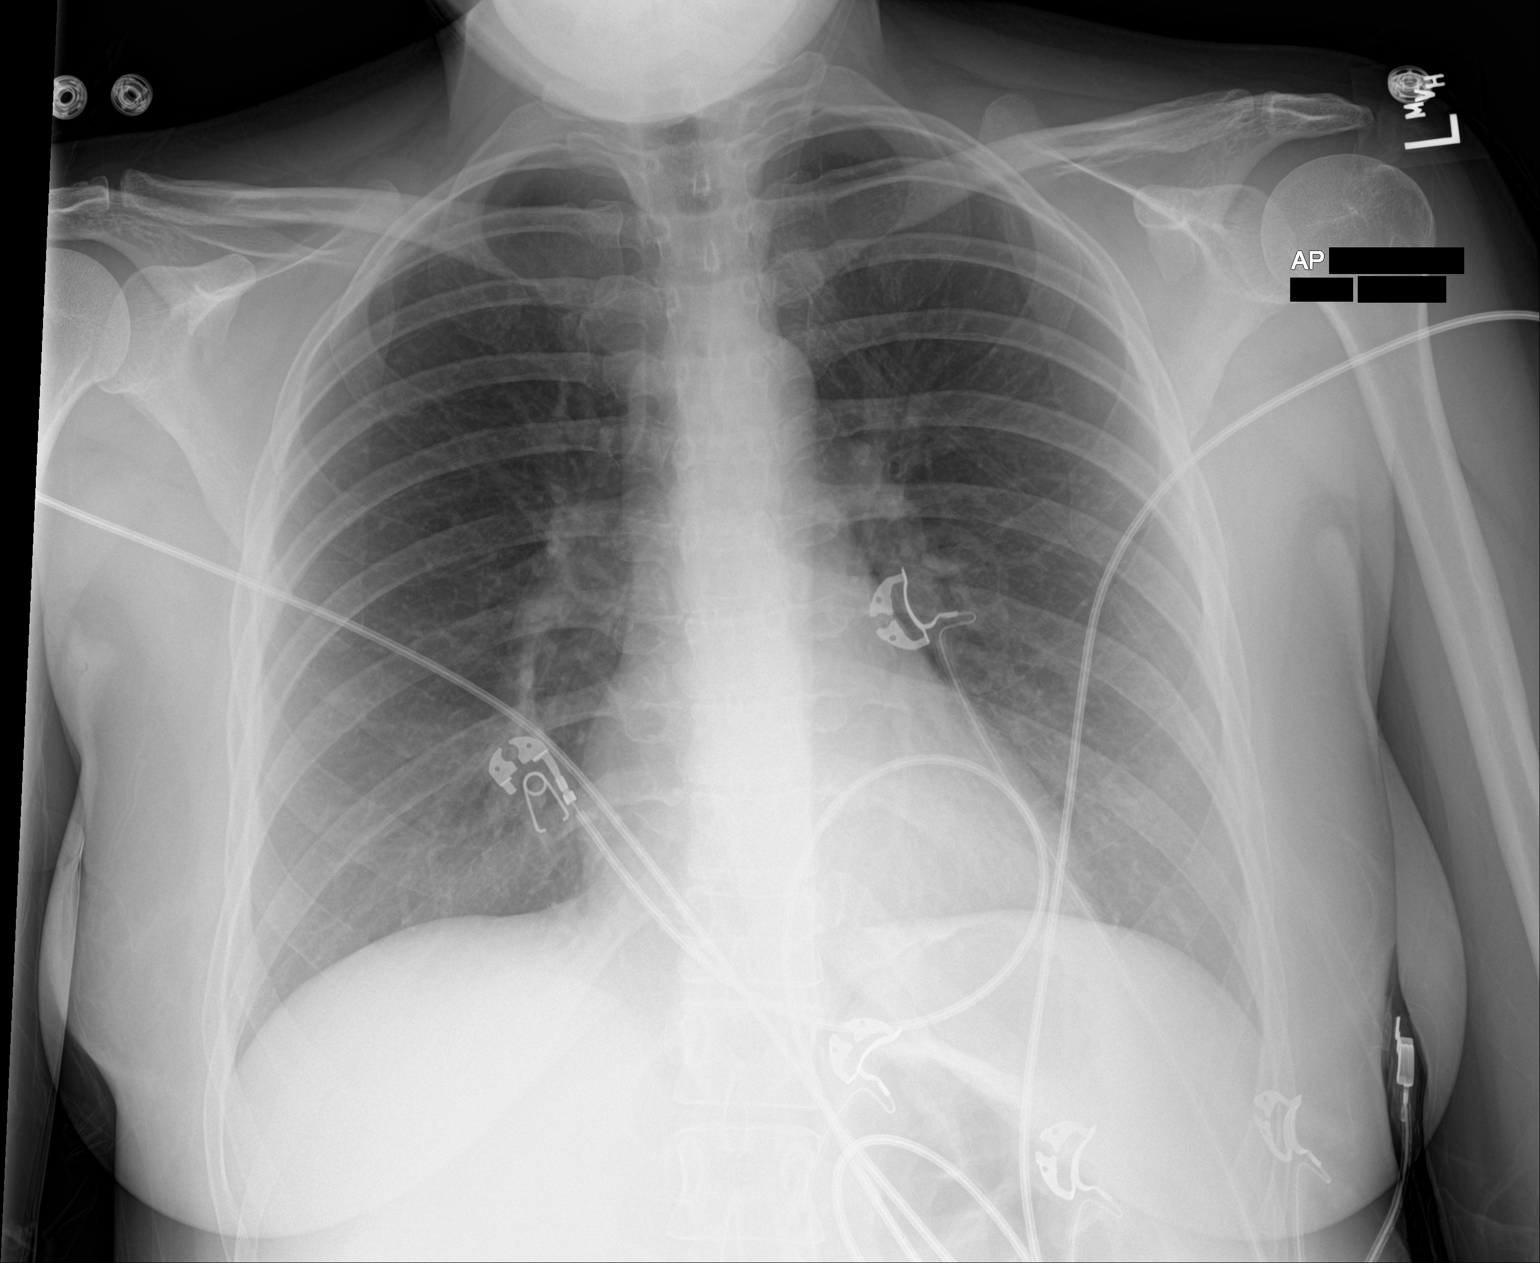

[1 of 1 positions shown; findings below may reference images not displayed]

FINDINGS: The heart size and mediastinal contours are within normal limits.
Both lungs are clear. The visualized skeletal structures are
unremarkable.
IMPRESSION: No active disease.

## 2023-08-21 ENCOUNTER — Emergency Department (HOSPITAL_COMMUNITY)
Admission: EM | Admit: 2023-08-21 | Discharge: 2023-08-21 | Disposition: A | Discharge status: Home / Self Care | Payer: MEDICAID | Attending: Emergency Medicine | Admitting: Emergency Medicine

## 2023-08-21 ENCOUNTER — Other Ambulatory Visit: Payer: Self-pay

## 2023-08-21 DIAGNOSIS — N3090 Cystitis, unspecified without hematuria: Secondary | ICD-10-CM | POA: Insufficient documentation

## 2023-08-21 DIAGNOSIS — R300 Dysuria: Secondary | ICD-10-CM | POA: Diagnosis present

## 2023-08-21 LAB — URINALYSIS, ROUTINE W REFLEX MICROSCOPIC
Bilirubin Urine: NEGATIVE
Glucose, UA: NEGATIVE mg/dL
Hgb urine dipstick: NEGATIVE
Ketones, ur: 20 mg/dL — AB
Leukocytes,Ua: NEGATIVE
Nitrite: POSITIVE — AB
Protein, ur: 100 mg/dL — AB
Specific Gravity, Urine: 1.023 (ref 1.005–1.030)
pH: 7 (ref 5.0–8.0)

## 2023-08-21 MED ORDER — CEPHALEXIN 500 MG PO CAPS
500.0000 mg | ORAL_CAPSULE | Freq: Two times a day (BID) | ORAL | 0 refills | Status: AC
Start: 2023-08-21 — End: 2023-08-26

## 2023-08-21 MED ORDER — CEPHALEXIN 250 MG PO CAPS
500.0000 mg | ORAL_CAPSULE | Freq: Once | ORAL | Status: AC
  Administered 2023-08-21: 500 mg via ORAL
  Filled 2023-08-21: qty 2

## 2023-08-21 NOTE — Discharge Instructions (Signed)
You were seen in the ER today for your urinary symptoms. You have a UTI. Please take the prescribed antibiotic for the entire course and follow up with your primary care doctor. Return to the ER with any new severe symptoms.

## 2023-08-21 NOTE — ED Provider Notes (Signed)
  [place] [facility]Y DEPARTMENT AT [facility] Provider Note   CSN: [id number] Arrival date & time: 08/21/23  0128     History {Add pertinent medical, surgical, social history, OB history to HPI:1} Chief Complaint  Patient presents with   Dysuria    Diane Foley is a 37 y.o. female.  HPI     Home Medications Prior to Admission medications   Medication Sig Start Date End Date Taking? Authorizing Provider  cephALEXin (KEFLEX) 500 MG capsule Take 1 capsule (500 mg total) by mouth 2 (two) times daily for 5 days. 08/21/23 08/26/23 Yes Artesha Wemhoff R, PA-C  escitalopram (LEXAPRO) 20 MG tablet Take 1 tablet (20 mg total) by mouth daily. Patient not taking: Reported on 05/28/2022 11/12/21   Estella Husk, MD  methadone (DOLOPHINE) 10 MG/ML solution Take 55 mg by mouth daily. Patient not taking: Reported on 05/28/2022    [provider]  traZODone (DESYREL) 50 MG tablet Take 2 tablets (100 mg total) by mouth at bedtime. Patient not taking: Reported on 05/28/2022 11/11/21   Estella Husk, MD      Allergies    Patient has no known allergies.    Review of Systems   Review of Systems  Physical Exam Updated Vital Signs BP 128/79 (BP Location: Right Arm)   Pulse 77   Temp (!) 97.3 F (36.3 C) (Oral)   Resp 18   LMP 08/07/2023   SpO2 96%  Physical Exam  ED Results / Procedures / Treatments   Labs (all labs ordered are listed, but only abnormal results are displayed) Labs Reviewed  URINALYSIS, ROUTINE W REFLEX MICROSCOPIC - Abnormal; Notable for the following components:      Result Value   Color, Urine ORANGE (*)    APPearance CLOUDY (*)    Ketones, ur 20 (*)    Protein, ur 100 (*)    Nitrite POSITIVE (*)    Bacteria, UA MANY (*)    All other components within normal limits    EKG None  Radiology No results found.  Procedures Procedures  {Document cardiac monitor, telemetry assessment procedure when  appropriate:1}  Medications Ordered in ED Medications  cephALEXin (KEFLEX) capsule 500 mg (has no administration in time range)    ED Course/ Medical Decision Making/ A&P   {   Click here for ABCD2, HEART and other calculatorsREFRESH Note before signing :1}                              Medical Decision Making Amount and/or Complexity of Data Reviewed Labs: ordered.  Risk Prescription drug management.   ***  {Document critical care time when appropriate:1} {Document review of labs and clinical decision tools ie heart score, Chads2Vasc2 etc:1}  {Document your independent review of radiology images, and any outside records:1} {Document your discussion with family members, caretakers, and with consultants:1} {Document social determinants of health affecting pt's care:1} {Document your decision making why or why not admission, treatments were needed:1} Final Clinical Impression(s) / ED Diagnoses Final diagnoses:  Cystitis    Rx / DC Orders ED Discharge Orders          Ordered    cephALEXin (KEFLEX) 500 MG capsule  2 times daily        08/21/23 0535

## 2023-08-21 NOTE — ED Triage Notes (Signed)
Patient reports persistent dysuria with malodorous concentrated urine for 3 weeks .
